# Patient Record
Sex: Female | Born: 1970 | Race: Black or African American | Hispanic: No | Marital: Single | Smoking: Never smoker
Health system: Southern US, Community
[De-identification: ages and names within clinical notes are randomized; demographics above are authoritative.]

## PROBLEM LIST (undated history)

## (undated) DIAGNOSIS — I10 Essential (primary) hypertension: Secondary | ICD-10-CM

## (undated) DIAGNOSIS — E119 Type 2 diabetes mellitus without complications: Secondary | ICD-10-CM

## (undated) DIAGNOSIS — F419 Anxiety disorder, unspecified: Secondary | ICD-10-CM

## (undated) DIAGNOSIS — J45909 Unspecified asthma, uncomplicated: Secondary | ICD-10-CM

## (undated) HISTORY — PX: BREAST SURGERY: SHX581

## (undated) HISTORY — PX: CHOLECYSTECTOMY: SHX55

## (undated) HISTORY — PX: HIGH RISK BREAST EXCISION: SHX6773

## (undated) HISTORY — PX: KNEE SURGERY: SHX244

---

## 2019-09-05 ENCOUNTER — Emergency Department (HOSPITAL_COMMUNITY)
Admission: EM | Admit: 2019-09-05 | Discharge: 2019-09-06 | Disposition: A | Discharge status: Home / Self Care | Payer: Medicare (Managed Care) | Attending: Emergency Medicine | Admitting: Emergency Medicine

## 2019-09-05 ENCOUNTER — Other Ambulatory Visit: Payer: Self-pay

## 2019-09-05 ENCOUNTER — Encounter (HOSPITAL_COMMUNITY): Payer: Self-pay | Admitting: Pediatrics

## 2019-09-05 DIAGNOSIS — Z5321 Procedure and treatment not carried out due to patient leaving prior to being seen by health care provider: Secondary | ICD-10-CM | POA: Diagnosis not present

## 2019-09-05 DIAGNOSIS — R109 Unspecified abdominal pain: Secondary | ICD-10-CM | POA: Diagnosis present

## 2019-09-05 LAB — CBC WITH DIFFERENTIAL/PLATELET
Abs Immature Granulocytes: 0.02 10*3/uL (ref 0.00–0.07)
Basophils Absolute: 0 10*3/uL (ref 0.0–0.1)
Basophils Relative: 1 %
Eosinophils Absolute: 0.1 10*3/uL (ref 0.0–0.5)
Eosinophils Relative: 1 %
HCT: 42.7 % (ref 36.0–46.0)
Hemoglobin: 13.1 g/dL (ref 12.0–15.0)
Immature Granulocytes: 0 %
Lymphocytes Relative: 36 %
Lymphs Abs: 2.1 10*3/uL (ref 0.7–4.0)
MCH: 25.7 pg — ABNORMAL LOW (ref 26.0–34.0)
MCHC: 30.7 g/dL (ref 30.0–36.0)
MCV: 83.9 fL (ref 80.0–100.0)
Monocytes Absolute: 0.4 10*3/uL (ref 0.1–1.0)
Monocytes Relative: 7 %
Neutro Abs: 3.2 10*3/uL (ref 1.7–7.7)
Neutrophils Relative %: 55 %
Platelets: 348 10*3/uL (ref 150–400)
RBC: 5.09 MIL/uL (ref 3.87–5.11)
RDW: 12 % (ref 11.5–15.5)
WBC: 5.9 10*3/uL (ref 4.0–10.5)
nRBC: 0 % (ref 0.0–0.2)

## 2019-09-05 LAB — COMPREHENSIVE METABOLIC PANEL
ALT: 25 U/L (ref 0–44)
AST: 18 U/L (ref 15–41)
Albumin: 3.5 g/dL (ref 3.5–5.0)
Alkaline Phosphatase: 139 U/L — ABNORMAL HIGH (ref 38–126)
Anion gap: 9 (ref 5–15)
BUN: 10 mg/dL (ref 6–20)
CO2: 26 mmol/L (ref 22–32)
Calcium: 9.2 mg/dL (ref 8.9–10.3)
Chloride: 99 mmol/L (ref 98–111)
Creatinine, Ser: 0.72 mg/dL (ref 0.44–1.00)
GFR calc Af Amer: >60 mL/min (ref >60)
GFR calc non Af Amer: >60 mL/min (ref >60)
Glucose, Bld: 448 mg/dL — ABNORMAL HIGH (ref 70–99)
Potassium: 4.1 mmol/L (ref 3.5–5.1)
Sodium: 134 mmol/L — ABNORMAL LOW (ref 135–145)
Total Bilirubin: 0.8 mg/dL (ref 0.3–1.2)
Total Protein: 6.7 g/dL (ref 6.5–8.1)

## 2019-09-05 LAB — URINALYSIS, ROUTINE W REFLEX MICROSCOPIC
Bacteria, UA: NONE SEEN
Bilirubin Urine: NEGATIVE
Glucose, UA: >=500 mg/dL — AB
Hgb urine dipstick: NEGATIVE
Ketones, ur: NEGATIVE mg/dL
Leukocytes,Ua: NEGATIVE
Nitrite: NEGATIVE
Protein, ur: NEGATIVE mg/dL
Specific Gravity, Urine: 1.023 (ref 1.005–1.030)
pH: 6 (ref 5.0–8.0)

## 2019-09-05 LAB — I-STAT BETA HCG BLOOD, ED (MC, WL, AP ONLY): I-stat hCG, quantitative: <5 m[IU]/mL (ref <5)

## 2019-09-05 NOTE — ED Triage Notes (Signed)
C/O right flank pain that radiates down and worst w/ breathing. Stated going on for couple of days.

## 2019-09-06 NOTE — ED Notes (Signed)
Patient stickers along with armband found in chair. Called this patient name with no response.

## 2019-09-07 ENCOUNTER — Ambulatory Visit (INDEPENDENT_AMBULATORY_CARE_PROVIDER_SITE_OTHER): Payer: Medicare (Managed Care)

## 2019-09-07 ENCOUNTER — Ambulatory Visit (HOSPITAL_COMMUNITY)
Admission: EM | Admit: 2019-09-07 | Discharge: 2019-09-07 | Disposition: A | Discharge status: Home / Self Care | Payer: Medicare (Managed Care) | Attending: Physician Assistant | Admitting: Physician Assistant

## 2019-09-07 ENCOUNTER — Encounter (HOSPITAL_COMMUNITY): Payer: Self-pay

## 2019-09-07 ENCOUNTER — Other Ambulatory Visit: Payer: Self-pay

## 2019-09-07 DIAGNOSIS — R1013 Epigastric pain: Secondary | ICD-10-CM | POA: Insufficient documentation

## 2019-09-07 DIAGNOSIS — R05 Cough: Secondary | ICD-10-CM | POA: Diagnosis not present

## 2019-09-07 DIAGNOSIS — J45909 Unspecified asthma, uncomplicated: Secondary | ICD-10-CM | POA: Insufficient documentation

## 2019-09-07 DIAGNOSIS — E119 Type 2 diabetes mellitus without complications: Secondary | ICD-10-CM | POA: Diagnosis not present

## 2019-09-07 DIAGNOSIS — Z7901 Long term (current) use of anticoagulants: Secondary | ICD-10-CM | POA: Diagnosis not present

## 2019-09-07 DIAGNOSIS — Z20822 Contact with and (suspected) exposure to covid-19: Secondary | ICD-10-CM | POA: Diagnosis not present

## 2019-09-07 DIAGNOSIS — R0789 Other chest pain: Secondary | ICD-10-CM | POA: Diagnosis not present

## 2019-09-07 DIAGNOSIS — M549 Dorsalgia, unspecified: Secondary | ICD-10-CM | POA: Insufficient documentation

## 2019-09-07 DIAGNOSIS — R0781 Pleurodynia: Secondary | ICD-10-CM | POA: Insufficient documentation

## 2019-09-07 DIAGNOSIS — Z7984 Long term (current) use of oral hypoglycemic drugs: Secondary | ICD-10-CM | POA: Insufficient documentation

## 2019-09-07 DIAGNOSIS — I10 Essential (primary) hypertension: Secondary | ICD-10-CM | POA: Insufficient documentation

## 2019-09-07 DIAGNOSIS — R059 Cough, unspecified: Secondary | ICD-10-CM

## 2019-09-07 DIAGNOSIS — Z79899 Other long term (current) drug therapy: Secondary | ICD-10-CM | POA: Diagnosis not present

## 2019-09-07 DIAGNOSIS — R112 Nausea with vomiting, unspecified: Secondary | ICD-10-CM | POA: Diagnosis not present

## 2019-09-07 DIAGNOSIS — M25511 Pain in right shoulder: Secondary | ICD-10-CM | POA: Diagnosis present

## 2019-09-07 HISTORY — DX: Essential (primary) hypertension: I10

## 2019-09-07 HISTORY — DX: Anxiety disorder, unspecified: F41.9

## 2019-09-07 HISTORY — DX: Unspecified asthma, uncomplicated: J45.909

## 2019-09-07 LAB — COMPREHENSIVE METABOLIC PANEL
ALT: 26 U/L (ref 0–44)
AST: 19 U/L (ref 15–41)
Albumin: 3.9 g/dL (ref 3.5–5.0)
Alkaline Phosphatase: 142 U/L — ABNORMAL HIGH (ref 38–126)
Anion gap: 10 (ref 5–15)
BUN: 8 mg/dL (ref 6–20)
CO2: 26 mmol/L (ref 22–32)
Calcium: 9.4 mg/dL (ref 8.9–10.3)
Chloride: 98 mmol/L (ref 98–111)
Creatinine, Ser: 0.73 mg/dL (ref 0.44–1.00)
GFR calc Af Amer: >60 mL/min (ref >60)
GFR calc non Af Amer: >60 mL/min (ref >60)
Glucose, Bld: 359 mg/dL — ABNORMAL HIGH (ref 70–99)
Potassium: 4.2 mmol/L (ref 3.5–5.1)
Sodium: 134 mmol/L — ABNORMAL LOW (ref 135–145)
Total Bilirubin: 0.8 mg/dL (ref 0.3–1.2)
Total Protein: 7.3 g/dL (ref 6.5–8.1)

## 2019-09-07 LAB — CBC
HCT: 43.6 % (ref 36.0–46.0)
Hemoglobin: 13.4 g/dL (ref 12.0–15.0)
MCH: 25.4 pg — ABNORMAL LOW (ref 26.0–34.0)
MCHC: 30.7 g/dL (ref 30.0–36.0)
MCV: 82.7 fL (ref 80.0–100.0)
Platelets: 370 10*3/uL (ref 150–400)
RBC: 5.27 MIL/uL — ABNORMAL HIGH (ref 3.87–5.11)
RDW: 12 % (ref 11.5–15.5)
WBC: 5.2 10*3/uL (ref 4.0–10.5)
nRBC: 0 % (ref 0.0–0.2)

## 2019-09-07 LAB — SARS CORONAVIRUS 2 (TAT 6-24 HRS): SARS Coronavirus 2: NEGATIVE

## 2019-09-07 LAB — LIPASE, BLOOD: Lipase: 28 U/L (ref 11–51)

## 2019-09-07 MED ORDER — ONDANSETRON HCL 4 MG PO TABS
4.0000 mg | ORAL_TABLET | Freq: Three times a day (TID) | ORAL | 0 refills | Status: DC | PRN
Start: 2019-09-07 — End: 2020-04-09

## 2019-09-07 MED ORDER — TIZANIDINE HCL 4 MG PO TABS
4.0000 mg | ORAL_TABLET | Freq: Three times a day (TID) | ORAL | 0 refills | Status: AC | PRN
Start: 2019-09-07 — End: 2019-09-14

## 2019-09-07 MED ORDER — ALUM & MAG HYDROXIDE-SIMETH 200-200-20 MG/5ML PO SUSP
ORAL | Status: AC
  Filled 2019-09-07: qty 30

## 2019-09-07 MED ORDER — BLOOD GLUCOSE MONITOR KIT: PACK | 0 refills | Status: DC

## 2019-09-07 MED ORDER — SUCRALFATE 1 GM/10ML PO SUSP
1.0000 g | Freq: Three times a day (TID) | ORAL | 0 refills | Status: DC
Start: 2019-09-07 — End: 2020-04-09

## 2019-09-07 MED ORDER — ALUM & MAG HYDROXIDE-SIMETH 200-200-20 MG/5ML PO SUSP
30.0000 mL | Freq: Once | ORAL | Status: AC
  Administered 2019-09-07: 30 mL via ORAL

## 2019-09-07 MED ORDER — LIDOCAINE VISCOUS HCL 2 % MT SOLN
15.0000 mL | Freq: Once | OROMUCOSAL | Status: AC
  Administered 2019-09-07: 15 mL via ORAL

## 2019-09-07 MED ORDER — ONDANSETRON 4 MG PO TBDP
ORAL_TABLET | ORAL | Status: AC
  Filled 2019-09-07: qty 1

## 2019-09-07 MED ORDER — LIDOCAINE VISCOUS HCL 2 % MT SOLN
OROMUCOSAL | Status: AC
  Filled 2019-09-07: qty 15

## 2019-09-07 MED ORDER — LIDOCAINE 4 % EX PTCH: 1.0000 "application " | MEDICATED_PATCH | Freq: Two times a day (BID) | CUTANEOUS | 0 refills | Status: DC

## 2019-09-07 MED ORDER — ONDANSETRON 4 MG PO TBDP
4.0000 mg | ORAL_TABLET | Freq: Once | ORAL | Status: AC
  Administered 2019-09-07: 4 mg via ORAL

## 2019-09-07 NOTE — Discharge Instructions (Addendum)
We have sent you lab work - We will call to discuss the findings if repeats are needed  Continue your home medicaitons  Add - carafate as prescribed - zofran only as needed for nausea - muscle relaxer as needed, this will make you sleepy do not drive or drink alcohol while taking - continue tylenol every 8 hours - apply lidocaine patches and warm compresses to areas of pain  If symptoms are worsening, go to the Emergency Department  Establish with a primary care provider ASAP for management of your diabetes

## 2019-09-07 NOTE — ED Provider Notes (Signed)
[place]    CSN: [id number] Arrival date & time: 09/07/19  0915      History   Chief Complaint Chief Complaint  Patient presents with  . Shoulder Pain    Right    HPI Laurie Andrews is a 49 y.o. female.   Patient reports to UC for back pain, side pain and right sided chest pain.  Pain has been present for at least the last 4 days.  She reports it started on the right side of her back but is since moved around to her chest.  She also endorses some pain in her upper right side of her belly.  Pain is are worse with movements and touch.  She does endorse some nausea and occasional vomiting, but this has not been frequent.  She is otherwise been eating and drinking.  She does endorse some abdominal discomfort with food.  Denies painful urination, frequency or urgency.  She denies shortness of breath.  She has been moving her bowels as usual.  There is no blood in the vomit.  No dark stools or blood in stool.  She does endorse a recent cough.  Denies fever and chills.   Patient reports she went to the emergency department on 7/28 however did not stay to be seen due to wait time.  Pain is not improved since then.  She reports she is new to the area and has been getting prescriptions from her previous doctor in [place].  This is to include pain medicines as well as diabetes medications.  She reports she takes glipizide and another medication.  She reports she has these medications.  She reports she has been out of testing strips.  She reports she cannot tolerate Metformin.  She reports she had her gallbladder out.  She is previously followed by nephrologist.     Past Medical History:  Diagnosis Date  . Anxiety   . Asthma   . Hypertension     There are no problems to display for this patient.   Past Surgical History:  Procedure Laterality Date  . BREAST SURGERY    . CHOLECYSTECTOMY    . KNEE SURGERY      OB History   No obstetric history on file.       Home Medications    Prior to Admission medications   Medication Sig Start Date End Date Taking? Authorizing Provider  albuterol (VENTOLIN HFA) 108 (90 Base) MCG/ACT inhaler INHALE 2 PUFFS 4 TIMES A DAY AS NEEDED FOR WHEEZING/SHORTNESS OF BREATH 03/05/19  Yes [provider]  glipiZIDE (GLUCOTROL) 10 MG tablet Take by mouth. 03/20/19  Yes [provider]  topiramate (TOPAMAX) 25 MG tablet TAKE 2 TABLETS BY MOUTH TWICE A DAY .Marland KitchenFOLLOW TITRATION SCHEDULE FROM MD 03/20/19  Yes [provider]  blood glucose meter kit and supplies KIT Dispense based on patient and insurance preference. Use up to four times daily as directed. (FOR ICD-9 250.00, 250.01). 09/07/19   Pocahontas Cohenour, Marguerita Beards, PA-C  Lidocaine (HM LIDOCAINE PATCH) 4 % PTCH Apply 1 application topically 2 (two) times daily. 09/07/19   Takya Vandivier, Marguerita Beards, PA-C  losartan (COZAAR) 50 MG tablet Take 50 mg by mouth daily. 08/29/19   [provider]  montelukast (SINGULAIR) 10 MG tablet SMARTSIG:1 Tablet(s) By Mouth Every Evening 08/29/19   [provider]  ondansetron (ZOFRAN) 4 MG tablet Take 1 tablet (4 mg total) by mouth every 8 (eight) hours as needed for nausea or vomiting. 09/07/19   Zamaria Brazzle,  Marguerita Beards, PA-C  pantoprazole (PROTONIX) 40 MG tablet Take 40 mg by mouth 2 (two) times daily. 08/29/19   [provider]  sucralfate (CARAFATE) 1 GM/10ML suspension Take 10 mLs (1 g total) by mouth 4 (four) times daily -  with meals and at bedtime. 09/07/19   Nnaemeka Samson, Marguerita Beards, PA-C  tiZANidine (ZANAFLEX) 4 MG tablet Take 1 tablet (4 mg total) by mouth every 8 (eight) hours as needed for up to 7 days for muscle spasms. 09/07/19 09/14/19  Antonio Creswell, Marguerita Beards, PA-C    Family History No family history on file.  Social History Social History   Tobacco Use  . Smoking status: Never Smoker  . Smokeless tobacco: Never Used  Vaping Use  . Vaping Use: Never used  Substance Use Topics  . Alcohol use: Not Currently  . Drug use: Never      Allergies   Aspirin, Keflex [cephalexin], Morphine and related, and Penicillins   Review of Systems Review of Systems   Physical Exam Triage Vital Signs ED Triage Vitals  Enc Vitals Group     BP 09/07/19 0956 (!) 159/73     Pulse Rate 09/07/19 0956 69     Resp 09/07/19 0956 18     Temp 09/07/19 0956 98.4 F (36.9 C)     Temp Source 09/07/19 0956 Oral     SpO2 09/07/19 0956 100 %     Weight 09/07/19 0957 175 lb 6.4 oz (79.6 kg)     Height 09/07/19 0957 '5\' 1"'$  (1.549 m)     Head Circumference --      Peak Flow --      Pain Score 09/07/19 0956 10     Pain Loc --      Pain Edu? --      Excl. in [place]? --    No data found.  Updated Vital Signs BP (!) 159/73   Pulse 69   Temp 98.4 F (36.9 C) (Oral)   Resp 18   Ht '5\' 1"'$  (1.549 m)   Wt 175 lb 6.4 oz (79.6 kg)   SpO2 100%   BMI 33.14 kg/m   Visual Acuity Right Eye Distance:   Left Eye Distance:   Bilateral Distance:    Right Eye Near:   Left Eye Near:    Bilateral Near:     Physical Exam Vitals and nursing note reviewed.  Constitutional:      General: She is not in acute distress.    Appearance: She is well-developed. She is not ill-appearing.  HENT:     Head: Normocephalic and atraumatic.  Eyes:     Conjunctiva/sclera: Conjunctivae normal.  Cardiovascular:     Rate and Rhythm: Normal rate and regular rhythm.     Heart sounds: No murmur heard.   Pulmonary:     Effort: Pulmonary effort is normal. No respiratory distress.     Breath sounds: Normal breath sounds.  Abdominal:     General: Bowel sounds are normal.     Palpations: Abdomen is soft.     Tenderness: There is abdominal tenderness (Epigastric and right upper quadrant.  ). There is no rebound.  Musculoskeletal:     Cervical back: Neck supple.     Comments: Tenderness to palpation of the right-sided back musculature around the right side of the ribs and musculature of the upper abdomen on the right side.  No midline spinal tenderness.   Skin:    General: Skin is warm and dry.  Neurological:  Mental Status: She is alert.      UC Treatments / Results  Labs (all labs ordered are listed, but only abnormal results are displayed) Labs Reviewed  CBC - Abnormal; Notable for the following components:      Result Value   RBC 5.27 (*)    MCH 25.4 (*)    All other components within normal limits  COMPREHENSIVE METABOLIC PANEL - Abnormal; Notable for the following components:   Sodium 134 (*)    Glucose, Bld 359 (*)    Alkaline Phosphatase 142 (*)    All other components within normal limits  SARS CORONAVIRUS 2 (TAT 6-24 HRS)  LIPASE, BLOOD    EKG Normal sinus rhythm.  No comparison.  Normal EKG.  Radiology DG Chest 2 View  Result Date: 09/07/2019 CLINICAL DATA:  Right side rib pain EXAM: CHEST - 2 VIEW COMPARISON:  None. FINDINGS: Heart and mediastinal contours are within normal limits. No focal opacities or effusions. No acute bony abnormality. No visible rib fracture. Aortic atherosclerosis. IMPRESSION: No active cardiopulmonary disease. Electronically Signed   By: Rolm Baptise M.D.   On: 09/07/2019 11:27    Procedures Procedures (including critical care time)  Medications Ordered in UC Medications  ondansetron (ZOFRAN-ODT) disintegrating tablet 4 mg (4 mg Oral Given 09/07/19 1146)  alum & mag hydroxide-simeth (MAALOX/MYLANTA) 200-200-20 MG/5ML suspension 30 mL (30 mLs Oral Given 09/07/19 1159)    And  lidocaine (XYLOCAINE) 2 % viscous mouth solution 15 mL (15 mLs Oral Given 09/07/19 1200)    Initial Impression / Assessment and Plan / UC Course  I have reviewed the triage vital signs and the nursing notes.  Pertinent labs & imaging results that were available during my care of the patient were reviewed by me and considered in my medical decision making (see chart for details).  Labs from 09/05/2019 emergency department visit reviewed.    #Epigastric pain #Musculoskeletal back pain #Chest wall  pain #Cough Patient is a 49 year old presenting with numerous concerns and complaints today.  EKG with normal sinus rhythm.  Labs stable, glucose improved from 09/05/2019 emergency department visit.  UA in emergency department visit with only glucosuria, no ketones, no other findings, doubt urinary system.  Per chart review she has had a high alkaline phosphate previously February 2021, no significant changes, gallbladder removed, doubt biliary.  Lipase normal.  She did have improvement of abdominal symptoms with GI cocktail.  Zofran given in clinic as she had episode of vomiting.  Chest x-ray without pathology.  Given these findings today likely this is an extra musculoskeletal pain as well as severe reflux symptoms . patient already on Protonix at home, will add Carafate.  Zofran as needed.  We will treat back pain is musculoskeletal, likely secondary to recent cough.  We will test her for Covid as well.  Discussed strict emergency department precautions.  Instructed patient that she needs to follow-up and establish with primary care soon as possible.  Resource given.  Patient verbalized understanding plan of care. -Lab results called and relayed to patient. Final Clinical Impressions(s) / UC Diagnoses   Final diagnoses:  Epigastric pain  Musculoskeletal back pain  Pain, chest wall  Cough     Discharge Instructions     We have sent you lab work - We will call to discuss the findings if repeats are needed  Continue your home medicaitons  Add - carafate as prescribed - zofran only as needed for nausea - muscle relaxer as needed, this will make  you sleepy do not drive or drink alcohol while taking - continue tylenol every 8 hours - apply lidocaine patches and warm compresses to areas of pain  If symptoms are worsening, go to the Emergency Department  Establish with a primary care provider ASAP for management of your diabetes      ED Prescriptions    Medication Sig Dispense Auth.  Provider   sucralfate (CARAFATE) 1 GM/10ML suspension Take 10 mLs (1 g total) by mouth 4 (four) times daily -  with meals and at bedtime. 420 mL Abdo Denault, Marguerita Beards, PA-C   ondansetron (ZOFRAN) 4 MG tablet Take 1 tablet (4 mg total) by mouth every 8 (eight) hours as needed for nausea or vomiting. 4 tablet Tiffane Sheldon, Marguerita Beards, PA-C   blood glucose meter kit and supplies KIT Dispense based on patient and insurance preference. Use up to four times daily as directed. (FOR ICD-9 250.00, 250.01). 1 each Reilley Valentine, Marguerita Beards, PA-C   tiZANidine (ZANAFLEX) 4 MG tablet Take 1 tablet (4 mg total) by mouth every 8 (eight) hours as needed for up to 7 days for muscle spasms. 21 tablet Michaiah Holsopple, Marguerita Beards, PA-C   Lidocaine (HM LIDOCAINE PATCH) 4 % PTCH Apply 1 application topically 2 (two) times daily. 10 patch Embrie Mikkelsen, Marguerita Beards, PA-C     I have reviewed the PDMP during this encounter.   Purnell Shoemaker, PA-C 09/07/19 2211

## 2019-09-07 NOTE — ED Triage Notes (Signed)
Pt reports having R sided shoulder pain and back pain that began 2 days ago. No known injury to areas. No urinary symptoms.

## 2020-02-21 ENCOUNTER — Ambulatory Visit (HOSPITAL_COMMUNITY)
Admission: EM | Admit: 2020-02-21 | Discharge: 2020-02-21 | Disposition: A | Discharge status: Home / Self Care | Payer: Medicare (Managed Care) | Attending: Family Medicine | Admitting: Family Medicine

## 2020-02-21 ENCOUNTER — Other Ambulatory Visit: Payer: Self-pay

## 2020-02-21 ENCOUNTER — Encounter (HOSPITAL_COMMUNITY): Payer: Self-pay | Admitting: *Deleted

## 2020-02-21 DIAGNOSIS — J069 Acute upper respiratory infection, unspecified: Secondary | ICD-10-CM

## 2020-02-21 DIAGNOSIS — U071 COVID-19: Secondary | ICD-10-CM | POA: Diagnosis not present

## 2020-02-21 MED ORDER — PROMETHAZINE-DM 6.25-15 MG/5ML PO SYRP: 5.0000 mL | ORAL_SOLUTION | Freq: Four times a day (QID) | ORAL | 0 refills | Status: DC | PRN

## 2020-02-21 MED ORDER — ONDANSETRON 4 MG PO TBDP: 4.0000 mg | ORAL_TABLET | Freq: Three times a day (TID) | ORAL | 0 refills | Status: DC | PRN

## 2020-02-21 NOTE — ED Triage Notes (Signed)
Patient reports productive cough, body aches, HA, N/V, generalized weakness starting yesterday, reports fever last night.      Took tylenol last a couple of hours ago without relief.   Coughing up green/bloody sputum.

## 2020-02-21 NOTE — ED Provider Notes (Signed)
[place]    CSN: [id number] Arrival date & time: 02/21/20  1707      History   Chief Complaint Chief Complaint  Patient presents with  . Cough  . Generalized Body Aches  . Fever    HPI Laurie Andrews is a 50 y.o. female.   Here today with 1 day history of fever, body aches, fatigue, generalized weakness, HA, N/V, cough with bloody and green sputum. Denies CP, SOB, wheezing, syncope, diarrhea. So far taking diabetic cough syrup and mucinex without relief. Several sick contacts recently. No known hx of pulmonary dz.      Past Medical History:  Diagnosis Date  . Anxiety   . Asthma   . Hypertension     There are no problems to display for this patient.   Past Surgical History:  Procedure Laterality Date  . BREAST SURGERY    . CHOLECYSTECTOMY    . KNEE SURGERY      OB History   No obstetric history on file.      Home Medications    Prior to Admission medications   Medication Sig Start Date End Date Taking? Authorizing Provider  amitriptyline (ELAVIL) 100 MG tablet Take 100 mg by mouth daily. 12/29/18  Yes [provider]  cetirizine (ZYRTEC) 10 MG tablet Take by mouth. 06/16/11  Yes [provider]  Cholecalciferol 25 MCG (1000 UT) capsule Take by mouth. 04/16/13  Yes [provider]  clonazePAM (KLONOPIN) 0.5 MG tablet Take by mouth. 09/24/11  Yes [provider]  cloNIDine (CATAPRES) 0.1 MG tablet TAKE 1 TABLET BY MOUTH EVERY MORNING AND 2 TABLETS AT BEDTIME 01/26/19  Yes [provider]  escitalopram (LEXAPRO) 10 MG tablet Take by mouth. 02/27/19  Yes [provider]  naratriptan (AMERGE) 2.5 MG tablet TAKE 1 TABLET BY MOUTH ONCE AS NEEDED FOR MIGRAINE HEADACHE, NO MORE THAN 3 DOSES A WEEK. 03/05/19  Yes [provider]  ondansetron (ZOFRAN ODT) 4 MG disintegrating tablet Take 1 tablet (4 mg total) by mouth every 8 (eight) hours as needed for nausea or vomiting. 02/21/20  Yes Volney American, PA-C  promethazine-dextromethorphan (PROMETHAZINE-DM) 6.25-15 MG/5ML syrup Take 5 mLs by mouth 4 (four) times daily as needed for cough. 02/21/20  Yes Volney American, PA-C  albuterol (VENTOLIN HFA) 108 (90 Base) MCG/ACT inhaler INHALE 2 PUFFS 4 TIMES A DAY AS NEEDED FOR WHEEZING/SHORTNESS OF BREATH 03/05/19   [provider]  blood glucose meter kit and supplies KIT Dispense based on patient and insurance preference. Use up to four times daily as directed. (FOR ICD-9 250.00, 250.01). 09/07/19   Darr, Edison Nasuti, PA-C  doxycycline (VIBRA-TABS) 100 MG tablet     [provider]  Dulaglutide (TRULICITY) 1.74 BS/4.9QP SOPN 1 (one) time per week    [provider]  Sanjuan Dame 17 GM/SCOOP powder Take by mouth. 09/04/19   [provider]  glipiZIDE (GLUCOTROL) 10 MG tablet Take by mouth. 03/20/19   [provider]  Lidocaine (HM LIDOCAINE PATCH) 4 % PTCH Apply 1 application topically 2 (two) times daily. 09/07/19   Darr, Edison Nasuti, PA-C  losartan (COZAAR) 50 MG tablet Take 50 mg by mouth daily. 08/29/19   [provider]  montelukast (SINGULAIR) 10 MG tablet SMARTSIG:1 Tablet(s) By Mouth Every Evening 08/29/19   [provider]  ondansetron (ZOFRAN) 4 MG tablet Take 1 tablet (4 mg total) by mouth every 8 (eight) hours as needed for nausea or vomiting. 09/07/19   Darr, Edison Nasuti,  PA-C  oxyCODONE (OXY IR/ROXICODONE) 5 MG immediate release tablet Take by mouth.    [provider]  pantoprazole (PROTONIX) 40 MG tablet Take 40 mg by mouth 2 (two) times daily. 08/29/19   [provider]  sucralfate (CARAFATE) 1 GM/10ML suspension Take 10 mLs (1 g total) by mouth 4 (four) times daily -  with meals and at bedtime. 09/07/19   Darr, Edison Nasuti, PA-C  temazepam (RESTORIL) 7.5 MG capsule as needed.    [provider]  topiramate (TOPAMAX) 25 MG tablet TAKE 2 TABLETS BY MOUTH TWICE A DAY .Marland KitchenFOLLOW TITRATION SCHEDULE FROM MD 03/20/19   [provider]    Family History No family history on file.  Social History Social History   Tobacco Use  . Smoking status: Never Smoker  . Smokeless tobacco: Never Used  Vaping Use  . Vaping Use: Never used  Substance Use Topics  . Alcohol use: Not Currently  . Drug use: Never     Allergies   Aspirin, Keflex [cephalexin], Morphine and related, and Penicillins   Review of Systems Review of Systems PER HPI   Physical Exam Triage Vital Signs ED Triage Vitals  Enc Vitals Group     BP 02/21/20 1758 (!) 146/74     Pulse Rate 02/21/20 1758 (!) 112     Resp 02/21/20 1758 17     Temp 02/21/20 1758 98.1 F (36.7 C)     Temp Source 02/21/20 1758 Oral     SpO2 02/21/20 1758 97 %     Weight --      Height --      Head Circumference --      Peak Flow --      Pain Score 02/21/20 1800 9     Pain Loc --      Pain Edu? --      Excl. in [place]? --    No data found.  Updated Vital Signs BP (!) 146/74 (BP Location: Left Arm)   Pulse (!) 112   Temp 98.1 F (36.7 C) (Oral)   Resp 17   SpO2 97%   Visual Acuity Right Eye Distance:   Left Eye Distance:   Bilateral Distance:    Right Eye Near:   Left Eye Near:    Bilateral Near:     Physical Exam Vitals and nursing note reviewed.  Constitutional:      Appearance: Normal appearance. She is not ill-appearing.  HENT:     Head: Atraumatic.     Right Ear: Tympanic membrane normal.     Left Ear: Tympanic membrane normal.     Nose: Rhinorrhea present.     Mouth/Throat:     Mouth: Mucous membranes are moist.     Pharynx: Posterior oropharyngeal erythema present.  Eyes:     Extraocular Movements: Extraocular movements intact.     Conjunctiva/sclera: Conjunctivae normal.  Cardiovascular:     Rate and Rhythm: Regular rhythm. Tachycardia present.     Heart sounds: Normal heart sounds.  Pulmonary:     Effort: Pulmonary effort is normal. No respiratory distress.     Breath sounds: Normal breath sounds. No wheezing or  rales.  Abdominal:     General: Bowel sounds are normal. There is no distension.     Palpations: Abdomen is soft.     Tenderness: There is no abdominal tenderness. There is no guarding.  Musculoskeletal:        General: Normal range of motion.     Cervical back:  Normal range of motion and neck supple.  Skin:    General: Skin is warm and dry.  Neurological:     Mental Status: She is alert and oriented to person, place, and time.  Psychiatric:        Mood and Affect: Mood normal.        Thought Content: Thought content normal.        Judgment: Judgment normal.      UC Treatments / Results  Labs (all labs ordered are listed, but only abnormal results are displayed) Labs Reviewed  SARS CORONAVIRUS 2 (TAT 6-24 HRS)    EKG   Radiology No results found.  Procedures Procedures (including critical care time)  Medications Ordered in UC Medications - No data to display  Initial Impression / Assessment and Plan / UC Course  I have reviewed the triage vital signs and the nursing notes.  Pertinent labs & imaging results that were available during my care of the patient were reviewed by me and considered in my medical decision making (see chart for details).     Mildly tachycardic and hypertensive in triage but otherwise reassuring vital signs and exam. Strong suspicion for COVID given her sxs, pcr pending, discussed isolation, supportive care, OTC medications and will send phenergan DM and zofran for prn symptomatic relief. Work note given. Strict return precautions for acutely worsening sxs.   Final Clinical Impressions(s) / UC Diagnoses   Final diagnoses:  Viral URI with cough   Discharge Instructions   None    ED Prescriptions    Medication Sig Dispense Auth. Provider   promethazine-dextromethorphan (PROMETHAZINE-DM) 6.25-15 MG/5ML syrup Take 5 mLs by mouth 4 (four) times daily as needed for cough. 100 mL Volney American, PA-C   ondansetron (ZOFRAN ODT) 4 MG  disintegrating tablet Take 1 tablet (4 mg total) by mouth every 8 (eight) hours as needed for nausea or vomiting. 20 tablet Volney American, [place]     PDMP not reviewed this encounter.   Volney American, [place] 02/21/20 1857

## 2020-02-22 LAB — SARS CORONAVIRUS 2 (TAT 6-24 HRS): SARS Coronavirus 2: POSITIVE — AB

## 2020-02-23 ENCOUNTER — Telehealth (HOSPITAL_COMMUNITY): Payer: Self-pay | Admitting: Family Medicine

## 2020-02-23 MED ORDER — PROMETHAZINE-DM 6.25-15 MG/5ML PO SYRP: 5.0000 mL | ORAL_SOLUTION | Freq: Four times a day (QID) | ORAL | 0 refills | Status: DC | PRN

## 2020-02-23 NOTE — Telephone Encounter (Signed)
Re-sent phenergan DM cough syrup

## 2020-04-05 ENCOUNTER — Encounter (HOSPITAL_COMMUNITY): Payer: Self-pay | Admitting: Emergency Medicine

## 2020-04-05 ENCOUNTER — Emergency Department (HOSPITAL_COMMUNITY)
Admission: EM | Admit: 2020-04-05 | Discharge: 2020-04-05 | Disposition: A | Discharge status: Home / Self Care | Payer: Medicare (Managed Care) | Attending: Emergency Medicine | Admitting: Emergency Medicine

## 2020-04-05 ENCOUNTER — Emergency Department (HOSPITAL_COMMUNITY): Payer: Medicare (Managed Care)

## 2020-04-05 ENCOUNTER — Other Ambulatory Visit: Payer: Self-pay

## 2020-04-05 DIAGNOSIS — Z79899 Other long term (current) drug therapy: Secondary | ICD-10-CM | POA: Insufficient documentation

## 2020-04-05 DIAGNOSIS — R109 Unspecified abdominal pain: Secondary | ICD-10-CM | POA: Diagnosis present

## 2020-04-05 DIAGNOSIS — R112 Nausea with vomiting, unspecified: Secondary | ICD-10-CM | POA: Diagnosis not present

## 2020-04-05 DIAGNOSIS — Z7984 Long term (current) use of oral hypoglycemic drugs: Secondary | ICD-10-CM | POA: Diagnosis not present

## 2020-04-05 DIAGNOSIS — J45909 Unspecified asthma, uncomplicated: Secondary | ICD-10-CM | POA: Diagnosis not present

## 2020-04-05 DIAGNOSIS — I10 Essential (primary) hypertension: Secondary | ICD-10-CM | POA: Insufficient documentation

## 2020-04-05 LAB — COMPREHENSIVE METABOLIC PANEL
ALT: 24 U/L (ref 0–44)
AST: 17 U/L (ref 15–41)
Albumin: 3.8 g/dL (ref 3.5–5.0)
Alkaline Phosphatase: 129 U/L — ABNORMAL HIGH (ref 38–126)
Anion gap: 11 (ref 5–15)
BUN: 11 mg/dL (ref 6–20)
CO2: 24 mmol/L (ref 22–32)
Calcium: 9.5 mg/dL (ref 8.9–10.3)
Chloride: 97 mmol/L — ABNORMAL LOW (ref 98–111)
Creatinine, Ser: 0.69 mg/dL (ref 0.44–1.00)
GFR, Estimated: >60 mL/min (ref >60)
Glucose, Bld: 403 mg/dL — ABNORMAL HIGH (ref 70–99)
Potassium: 4.7 mmol/L (ref 3.5–5.1)
Sodium: 132 mmol/L — ABNORMAL LOW (ref 135–145)
Total Bilirubin: 0.9 mg/dL (ref 0.3–1.2)
Total Protein: 7.3 g/dL (ref 6.5–8.1)

## 2020-04-05 LAB — I-STAT BETA HCG BLOOD, ED (MC, WL, AP ONLY): I-stat hCG, quantitative: <5 m[IU]/mL (ref <5)

## 2020-04-05 LAB — URINALYSIS, ROUTINE W REFLEX MICROSCOPIC
Bacteria, UA: NONE SEEN
Bilirubin Urine: NEGATIVE
Glucose, UA: >=500 mg/dL — AB
Hgb urine dipstick: NEGATIVE
Ketones, ur: NEGATIVE mg/dL
Leukocytes,Ua: NEGATIVE
Nitrite: NEGATIVE
Protein, ur: NEGATIVE mg/dL
Specific Gravity, Urine: >1.046 — ABNORMAL HIGH (ref 1.005–1.030)
pH: 6 (ref 5.0–8.0)

## 2020-04-05 LAB — CBC
HCT: 45.2 % (ref 36.0–46.0)
Hemoglobin: 13.9 g/dL (ref 12.0–15.0)
MCH: 25.3 pg — ABNORMAL LOW (ref 26.0–34.0)
MCHC: 30.8 g/dL (ref 30.0–36.0)
MCV: 82.2 fL (ref 80.0–100.0)
Platelets: 369 10*3/uL (ref 150–400)
RBC: 5.5 MIL/uL — ABNORMAL HIGH (ref 3.87–5.11)
RDW: 12.1 % (ref 11.5–15.5)
WBC: 5.3 10*3/uL (ref 4.0–10.5)
nRBC: 0 % (ref 0.0–0.2)

## 2020-04-05 LAB — LIPASE, BLOOD: Lipase: 34 U/L (ref 11–51)

## 2020-04-05 LAB — CBG MONITORING, ED: Glucose-Capillary: 329 mg/dL — ABNORMAL HIGH (ref 70–99)

## 2020-04-05 MED ORDER — ONDANSETRON 4 MG PO TBDP: 4.0000 mg | ORAL_TABLET | Freq: Three times a day (TID) | ORAL | 0 refills | Status: DC | PRN

## 2020-04-05 MED ORDER — ONDANSETRON 4 MG PO TBDP
4.0000 mg | ORAL_TABLET | Freq: Once | ORAL | Status: AC
  Administered 2020-04-05: 4 mg via ORAL
  Filled 2020-04-05: qty 1

## 2020-04-05 MED ORDER — SODIUM CHLORIDE 0.9 % IV BOLUS
1000.0000 mL | Freq: Once | INTRAVENOUS | Status: AC
  Administered 2020-04-05: 1000 mL via INTRAVENOUS

## 2020-04-05 MED ORDER — HYDROMORPHONE HCL 1 MG/ML IJ SOLN
0.5000 mg | Freq: Once | INTRAMUSCULAR | Status: AC
  Administered 2020-04-05: 0.5 mg via INTRAVENOUS
  Filled 2020-04-05: qty 1

## 2020-04-05 MED ORDER — IOHEXOL 300 MG/ML  SOLN
100.0000 mL | Freq: Once | INTRAMUSCULAR | Status: AC | PRN
  Administered 2020-04-05: 100 mL via INTRAVENOUS

## 2020-04-05 MED ORDER — DICLOFENAC SODIUM 1 % EX GEL: 2.0000 g | Freq: Four times a day (QID) | CUTANEOUS | 0 refills | Status: DC

## 2020-04-05 NOTE — ED Provider Notes (Signed)
Received patient as a handoff at shift change from [facility], [place].  In short, patient is a 50 year old female with PMH of HTN, asthma, anxiety, and abdominal surgery notable for prior cholecystectomy who presented to the ED for right side abdominal pain.  It was described as right-sided flank discomfort that radiates anteriorly.  Her discomfort is worse with palpation and certain movement.  No reported history of renal stones.  No urinary symptoms or vaginal discharge/bleeding/pelvic pain.  Patient was also noted to have elevated CBG to 403, but appears to be similar to priors.  She was treated with IV fluids, pain medications, and antiemetics.  Lower suspicion for pelvic pathology and CT abdomen and pelvis was ordered with contrast with primary concern for appendicitis versus diverticulitis versus obstructing kidney stone.  Laboratory work-up is without evidence of DKA or leukocytosis concerning for infection.  Lipase was within normal limits.  At time of handoff, UA and CT abdomen pelvis are pending.  Patient will need to be p.o. challenged and have adequate pain control.  She has prescribed oxycodone at home.  She had been given Dilaudid given reported allergy to morphine.  Physical Exam  BP 125/71 (BP Location: Right Arm)   Pulse 78   Temp 98 F (36.7 C) (Oral)   Resp 14   SpO2 99%   Physical Exam  ED Course/Procedures     Procedures Results for orders placed or performed during the hospital encounter of 04/05/20  Lipase, blood  Result Value Ref Range   Lipase 34 11 - 51 U/L  Comprehensive metabolic panel  Result Value Ref Range   Sodium 132 (L) 135 - 145 mmol/L   Potassium 4.7 3.5 - 5.1 mmol/L   Chloride 97 (L) 98 - 111 mmol/L   CO2 24 22 - 32 mmol/L   Glucose, Bld 403 (H) 70 - 99 mg/dL   BUN 11 6 - 20 mg/dL   Creatinine, Ser 8.33 0.44 - 1.00 mg/dL   Calcium 9.5 8.9 - 82.5 mg/dL   Total Protein 7.3 6.5 - 8.1 g/dL   Albumin 3.8 3.5 - 5.0 g/dL   AST 17 15 - 41 U/L   ALT  24 0 - 44 U/L   Alkaline Phosphatase 129 (H) 38 - 126 U/L   Total Bilirubin 0.9 0.3 - 1.2 mg/dL   GFR, Estimated >05 >39 mL/min   Anion gap 11 5 - 15  CBC  Result Value Ref Range   WBC 5.3 4.0 - 10.5 K/uL   RBC 5.50 (H) 3.87 - 5.11 MIL/uL   Hemoglobin 13.9 12.0 - 15.0 g/dL   HCT 76.7 34.1 - 93.7 %   MCV 82.2 80.0 - 100.0 fL   MCH 25.3 (L) 26.0 - 34.0 pg   MCHC 30.8 30.0 - 36.0 g/dL   RDW 90.2 40.9 - 73.5 %   Platelets 369 150 - 400 K/uL   nRBC 0.0 0.0 - 0.2 %  Urinalysis, Routine w reflex microscopic Urine, Clean Catch  Result Value Ref Range   Color, Urine YELLOW YELLOW   APPearance CLEAR CLEAR   Specific Gravity, Urine >1.046 (H) 1.005 - 1.030   pH 6.0 5.0 - 8.0   Glucose, UA >=500 (A) NEGATIVE mg/dL   Hgb urine dipstick NEGATIVE NEGATIVE   Bilirubin Urine NEGATIVE NEGATIVE   Ketones, ur NEGATIVE NEGATIVE mg/dL   Protein, ur NEGATIVE NEGATIVE mg/dL   Nitrite NEGATIVE NEGATIVE   Leukocytes,Ua NEGATIVE NEGATIVE   RBC / HPF 0-5 0 - 5 RBC/hpf  WBC, UA 0-5 0 - 5 WBC/hpf   Bacteria, UA NONE SEEN NONE SEEN   Squamous Epithelial / LPF 0-5 0 - 5  I-Stat beta hCG blood, ED  Result Value Ref Range   I-stat hCG, quantitative <5.0 <5 mIU/mL   Comment 3          POC CBG, ED  Result Value Ref Range   Glucose-Capillary 329 (H) 70 - 99 mg/dL   CT ABDOMEN PELVIS W CONTRAST  Result Date: 04/05/2020 CLINICAL DATA:  Right lower quadrant pain, possible appendicitis or kidney stone. EXAM: CT ABDOMEN AND PELVIS WITH CONTRAST TECHNIQUE: Multidetector CT imaging of the abdomen and pelvis was performed using the standard protocol following bolus administration of intravenous contrast. CONTRAST:  OMNIPAQUE IOHEXOL 300 MG/ML  SOLN COMPARISON:  None. FINDINGS: Lower chest: Lung bases are clear. Hepatobiliary: Mild hepatic steatosis without focal mass. Previous cholecystectomy. Biliary tree is normal. Pancreas: Normal. Spleen: Normal. Adrenals/Urinary Tract: Adrenal glands are normal. Kidneys  are normal size without hydronephrosis or nephrolithiasis. 1 cm cyst over the upper pole left kidney. Ureters are normal. Bladder is normal size. There is minimal patchy increased density along the posterior dependent bladder likely early contrast filling. Stomach/Bowel: Stomach and small bowel are normal. Appendix is normal. Colon is normal. Vascular/Lymphatic: Abdominal aorta is normal in caliber. No adenopathy. Reproductive: Normal. Other: No free fluid or focal inflammatory change. Musculoskeletal: No focal abnormality. IMPRESSION: 1. No acute findings in the abdomen/pelvis. Normal appendix. 2. 1 cm left renal cyst. 3. Mild hepatic steatosis without focal mass. Electronically Signed   By: Elberta Fortis M.D.   On: 04/05/2020 15:33    MDM   On my examination, patient is resting comfortably talking to her niece on the phone.  She states that she has had some improvement, but still endorses mild right-sided abdominal pain.  She states that she thought this was food poisoning because she ate [place] roadhouse last evening and did not quite sit well with her.  She did have some retching and emesis this morning which may be contributing to her right-sided abdominal discomfort, likely musculoskeletal in nature.  We went through the CT abdomen pelvis she was reassured by the negative findings.  She understands that she has a pain contract and that there will be no additional narcotics medications prescribed for her.  She is no longer endorsing nausea and was successfully p.o. challenge here in the ED.  Repeat CBG shows improvement and UA with glucose urea, but no infection.  This patient presents with abdominal pain of unclear etiology. A CT scan was performed to evaluate for potential causes of the abdominal pain, however, neither the clinical exam nor the CT has identified an emergent etiology for the abdominal pain. Specifically, given the benign exam, the laboratory studies, and unremarkable CT, I have a very  low suspicion for appendicitis, ischemic bowel, bowel perforation, or any other life threatening disease.  I discussed this with patient's son, Guy Sandifer, as well.  Encouraged outpatient follow-up with her primary care provider.  Patient voices understanding is agreeable to the plan.       Lorelee New, PA-C 04/05/20 1901    Terrilee Files, MD 04/06/20 [phone]

## 2020-04-05 NOTE — Discharge Instructions (Addendum)
Your work-up today was entirely reassuring.  Please follow-up with your primary care provider regarding today's encounter for ongoing evaluation and management.  I suspect that your discomfort is musculoskeletal.  Please take Zofran ODT as needed for nausea symptoms.  I have also prescribed you Voltaren which is an NSAID-based gel.  This is meant to be applied topically to help provide additional pain relief.  While you have a listed allergy to aspirin, this should be well tolerated.  Please discontinue should you develop a rash or other reaction..  Many cases can be observed and treated at home after initial evaluation in the emergency department. Even though you are being discharged home, abdominal pain can be unpredictable. Therefore, you need a repeat exam if your pain does not resolve, if it returns, or worsens. Most patient's with abdominal pain do not need to be admitted to the hospital or have surgery, but serious problems like appendicitis and gallbladder attacks can start out as nonspecific pain. Many abdominal conditions cannot be diagnosed in one visit, so follow-up evaluations are very important.  Please seek immediate care if you have any develop any of the following symptoms: The pain does not improve or gets worse.  You develop a fever or chills.  You keep throwing up (vomiting).  The pain is felt only in portions of the abdomen.  You pass bloody or black tarry stools.   You do not have a bowel movement for more than 4 days.  You have profuse (greater than 4/day), loose stools for more than 4 days.

## 2020-04-05 NOTE — ED Notes (Signed)
Patient discharge instructions reviewed with the patient. The patient verbalized understanding. Patient discharged. 

## 2020-04-05 NOTE — ED Triage Notes (Signed)
Pt. Stated, Im having rt, side pain, starts in my back and goes around to my stomach. This started this morning.

## 2020-04-05 NOTE — ED Provider Notes (Signed)
[facility] EMERGENCY DEPARTMENT Provider Note   CSN: [id number] Arrival date & time: 04/05/20  1225     History Chief Complaint  Patient presents with  . Abdominal Pain  . Nausea  . Emesis    Laurie Andrews is a 50 y.o. female.  Laurie Andrews is a 50 y.o. female with a history of hypertension, asthma and anxiety, previous cholecystectomy, who presents to the emergency department for evaluation of right-sided abdominal pain.  She reports that last night she went out to dinner with her family and states that her abdomen felt a bit off but she was able to sleep, woke up early this morning with severe right-sided abdominal pain that seems to start in her right side and flank and wraparound throughout the right side of her abdomen.  It is worse with movement or palpation, seems to improve if she stays very still.  She has had some chills but no fevers has had nausea and vomiting.  Has had multiple bowel movements today but reports they have been normal without diarrhea, no melena or hematochezia.  No associated chest pain or shortness of breath.  She denies any dysuria, urinary frequency or hematuria.  No vaginal bleeding or discharge, is on Depo-Provera.  Aside from cholecystectomy no other prior abdominal surgeries.  No medications prior to arrival.  No other aggravating or alleviating factors.        Past Medical History:  Diagnosis Date  . Anxiety   . Asthma   . Hypertension     There are no problems to display for this patient.   Past Surgical History:  Procedure Laterality Date  . BREAST SURGERY    . CHOLECYSTECTOMY    . KNEE SURGERY       OB History   No obstetric history on file.     No family history on file.  Social History   Tobacco Use  . Smoking status: Never Smoker  . Smokeless tobacco: Never Used  Vaping Use  . Vaping Use: Never used  Substance Use Topics  . Alcohol use: Not Currently  . Drug use: Never    Home  Medications Prior to Admission medications   Medication Sig Start Date End Date Taking? Authorizing Provider  albuterol (VENTOLIN HFA) 108 (90 Base) MCG/ACT inhaler Inhale 1-2 puffs into the lungs daily as needed for wheezing or shortness of breath. 03/05/19  Yes [provider]  amitriptyline (ELAVIL) 100 MG tablet Take 100 mg by mouth as needed for sleep. 12/29/18  Yes [provider]  cetirizine (ZYRTEC) 10 MG tablet Take 10 mg by mouth daily. 06/16/11  Yes [provider]  Cholecalciferol 25 MCG (1000 UT) capsule Take 1,000 Units by mouth daily. 04/16/13  Yes [provider]  clonazePAM (KLONOPIN) 0.5 MG tablet Take 0.5 mg by mouth daily. 09/24/11  Yes [provider]  Dulaglutide (TRULICITY) 1.02 HE/5.2DP SOPN Inject 0.75 mg as directed once a week.   Yes [provider]  glipiZIDE (GLUCOTROL) 10 MG tablet Take 20 mg by mouth daily before breakfast. 03/20/19  Yes [provider]  losartan (COZAAR) 50 MG tablet Take 50 mg by mouth daily. 08/29/19  Yes [provider]  montelukast (SINGULAIR) 10 MG tablet Take 10 mg by mouth daily. 08/29/19  Yes [provider]  naratriptan (AMERGE) 2.5 MG tablet Take 2.5 mg by mouth as needed for migraine. 03/05/19  Yes [provider]  nystatin cream (MYCOSTATIN) Apply 1 application topically 3 (three) times daily  as needed for dry skin. 01/05/20  Yes [provider]  oxyCODONE (OXY IR/ROXICODONE) 5 MG immediate release tablet Take 5 mg by mouth in the morning, at noon, and at bedtime.   Yes [provider]  pantoprazole (PROTONIX) 40 MG tablet Take 40 mg by mouth 2 (two) times daily. 08/29/19  Yes [provider]  topiramate (TOPAMAX) 25 MG tablet Take 50 mg by mouth 2 (two) times daily. 03/20/19  Yes [provider]  blood glucose meter kit and supplies KIT Dispense based on patient and insurance preference. Use up to four times daily as directed.  (FOR ICD-9 250.00, 250.01). 09/07/19   Darr, Edison Nasuti, PA-C  GAVILAX 17 GM/SCOOP powder Take 17 g by mouth daily as needed for mild constipation. 09/04/19   [provider]  Lidocaine (HM LIDOCAINE PATCH) 4 % PTCH Apply 1 application topically 2 (two) times daily. Patient not taking: No sig reported 09/07/19   Darr, Edison Nasuti, PA-C  ondansetron (ZOFRAN ODT) 4 MG disintegrating tablet Take 1 tablet (4 mg total) by mouth every 8 (eight) hours as needed for nausea or vomiting. Patient not taking: No sig reported 02/21/20   Volney American, PA-C  ondansetron (ZOFRAN) 4 MG tablet Take 1 tablet (4 mg total) by mouth every 8 (eight) hours as needed for nausea or vomiting. Patient not taking: No sig reported 09/07/19   Darr, Edison Nasuti, PA-C  promethazine-dextromethorphan (PROMETHAZINE-DM) 6.25-15 MG/5ML syrup Take 5 mLs by mouth 4 (four) times daily as needed for cough. Patient not taking: No sig reported 02/23/20   Volney American, PA-C  sucralfate (CARAFATE) 1 GM/10ML suspension Take 10 mLs (1 g total) by mouth 4 (four) times daily -  with meals and at bedtime. Patient not taking: No sig reported 09/07/19   Darr, Edison Nasuti, PA-C    Allergies    Keflex [cephalexin], Aspirin, Morphine and related, and Penicillins  Review of Systems   Review of Systems  Constitutional: Negative for chills and fever.  HENT: Negative.   Respiratory: Negative for cough and shortness of breath.   Cardiovascular: Negative for chest pain.  Gastrointestinal: Positive for abdominal pain, nausea and vomiting. Negative for blood in stool, constipation and diarrhea.  Genitourinary: Positive for flank pain. Negative for dysuria, frequency, hematuria, vaginal bleeding and vaginal discharge.  Musculoskeletal: Negative for arthralgias and myalgias.  Skin: Negative for color change and rash.  Neurological: Negative for dizziness, syncope and light-headedness.  All other systems reviewed and are negative.   Physical  Exam Updated Vital Signs BP (!) 156/73 (BP Location: Right Arm)   Pulse 90   Temp 98 F (36.7 C) (Oral)   Resp 18   SpO2 99%   Physical Exam Vitals and nursing note reviewed.  Constitutional:      General: She is not in acute distress.    Appearance: She is well-developed and well-nourished. She is obese. She is not ill-appearing or diaphoretic.     Comments: Appears uncomfortable but is in no acute distress  HENT:     Head: Normocephalic and atraumatic.     Mouth/Throat:     Mouth: Oropharynx is clear and moist. Mucous membranes are moist.     Pharynx: Oropharynx is clear.  Eyes:     General:        Right eye: No discharge.        Left eye: No discharge.     Extraocular Movements: EOM normal.     Pupils: Pupils are equal, round, and reactive to light.  Cardiovascular:     Rate and Rhythm: Normal rate and regular rhythm.     Pulses: Intact distal pulses.     Heart sounds: Normal heart sounds. No murmur heard. No friction rub. No gallop.   Pulmonary:     Effort: Pulmonary effort is normal. No respiratory distress.     Breath sounds: Normal breath sounds. No wheezing or rales.     Comments: Respirations equal and unlabored, patient able to speak in full sentences, lungs clear to auscultation bilaterally  Abdominal:     General: Bowel sounds are normal. There is no distension.     Palpations: Abdomen is soft. There is no mass.     Tenderness: There is abdominal tenderness in the right upper quadrant and right lower quadrant. There is right CVA tenderness. There is no guarding.     Comments: Abdomen is soft and nondistended, bowel sounds present throughout, there is focal tenderness noted throughout the right upper and lower quadrants as well as over the right flank, slight guarding present.  No left-sided abdominal tenderness  Musculoskeletal:        General: No deformity or edema.     Cervical back: Neck supple.  Skin:    General: Skin is warm and dry.     Capillary Refill:  Capillary refill takes less than 2 seconds.  Neurological:     Mental Status: She is alert.     Coordination: Coordination normal.     Comments: Speech is clear, able to follow commands Moves extremities without ataxia, coordination intact  Psychiatric:        Mood and Affect: Mood normal.        Behavior: Behavior normal.     ED Results / Procedures / Treatments   Labs (all labs ordered are listed, but only abnormal results are displayed) Labs Reviewed  COMPREHENSIVE METABOLIC PANEL - Abnormal; Notable for the following components:      Result Value   Sodium 132 (*)    Chloride 97 (*)    Glucose, Bld 403 (*)    Alkaline Phosphatase 129 (*)    All other components within normal limits  CBC - Abnormal; Notable for the following components:   RBC 5.50 (*)    MCH 25.3 (*)    All other components within normal limits  LIPASE, BLOOD  URINALYSIS, ROUTINE W REFLEX MICROSCOPIC  I-STAT BETA HCG BLOOD, ED ([facility], [facility], [facility] ONLY)    EKG None  Radiology No results found.  Procedures Procedures   Medications Ordered in ED Medications  HYDROmorphone (DILAUDID) injection 0.5 mg (has no administration in time range)  ondansetron (ZOFRAN-ODT) disintegrating tablet 4 mg (4 mg Oral Given 04/05/20 1244)  sodium chloride 0.9 % bolus 1,000 mL (1,000 mLs Intravenous New Bag/Given 04/05/20 1434)  HYDROmorphone (DILAUDID) injection 0.5 mg (0.5 mg Intravenous Given 04/05/20 1435)  iohexol (OMNIPAQUE) 300 MG/ML solution 100 mL (100 mLs Intravenous Contrast Given 04/05/20 1515)    ED Course  I have reviewed the triage vital signs and the nursing notes.  Pertinent labs & imaging results that were available during my care of the patient were reviewed by me and considered in my medical decision making (see chart for details).    MDM Rules/Calculators/A&P                         Patient presents to the ED with complaints of abdominal pain. Patient nontoxic appearing, in no apparent distress, vitals  WNL. On exam  patient tender to palpation throughout the right upper and lower quadrants with some tenderness over the right flank as well, no peritoneal signs. Will evaluate with labs and CT abdomen pelvis. Analgesics, anti-emetics, and fluids administered.   Differential diagnosis: Appendicitis, diverticulitis, kidney stone, pyelonephritis, obstruction, hepatitis, choledocholithiasis, pancreatitis, given more upper and flank pain associated with right-sided abdominal pain have lower suspicion for pelvic etiology such as ovarian cyst or mass, no discharge or bleeding.  I have independently ordered, reviewed and interpreted all labs and imaging:  CBC: No leukocytosis, normal hemoglobin CMP: Glucose of 403, patient is often hyperglycemic in the 3-4 100s when here in the ED, but no other significant electrolyte derangements, alk phos of 129 which is also at baseline for patient, will give IV fluids for hyperglycemia, normal renal and liver function Lipase: WNL UA: Pending Preg test: neg  At shift change CT abdomen pelvis is pending, care signed out to PA Krista Blue who will follow up on CT, if no acute changes in patient's pain is improved and tolerating p.o., can likely be discharged home.    Final Clinical Impression(s) / ED Diagnoses Final diagnoses:  Right sided abdominal pain  Non-intractable vomiting with nausea, unspecified vomiting type    Rx / DC Orders ED Discharge Orders    None       Janet Berlin 04/05/20 1534    Lajean Saver, MD 04/05/20 1606

## 2020-04-07 ENCOUNTER — Inpatient Hospital Stay (HOSPITAL_COMMUNITY)
Admission: EM | Admit: 2020-04-07 | Discharge: 2020-04-09 | DRG: 690 | Disposition: A | Discharge status: Home / Self Care | Payer: Medicare (Managed Care) | Attending: Internal Medicine | Admitting: Internal Medicine

## 2020-04-07 ENCOUNTER — Other Ambulatory Visit: Payer: Self-pay

## 2020-04-07 ENCOUNTER — Encounter (HOSPITAL_COMMUNITY): Payer: Self-pay | Admitting: Emergency Medicine

## 2020-04-07 DIAGNOSIS — F419 Anxiety disorder, unspecified: Secondary | ICD-10-CM | POA: Diagnosis present

## 2020-04-07 DIAGNOSIS — K5903 Drug induced constipation: Secondary | ICD-10-CM | POA: Diagnosis present

## 2020-04-07 DIAGNOSIS — G8929 Other chronic pain: Secondary | ICD-10-CM | POA: Diagnosis present

## 2020-04-07 DIAGNOSIS — M25562 Pain in left knee: Secondary | ICD-10-CM | POA: Diagnosis present

## 2020-04-07 DIAGNOSIS — Z7984 Long term (current) use of oral hypoglycemic drugs: Secondary | ICD-10-CM

## 2020-04-07 DIAGNOSIS — Z20822 Contact with and (suspected) exposure to covid-19: Secondary | ICD-10-CM | POA: Diagnosis present

## 2020-04-07 DIAGNOSIS — N12 Tubulo-interstitial nephritis, not specified as acute or chronic: Secondary | ICD-10-CM

## 2020-04-07 DIAGNOSIS — R112 Nausea with vomiting, unspecified: Secondary | ICD-10-CM

## 2020-04-07 DIAGNOSIS — E1165 Type 2 diabetes mellitus with hyperglycemia: Secondary | ICD-10-CM | POA: Diagnosis present

## 2020-04-07 DIAGNOSIS — N10 Acute tubulo-interstitial nephritis: Principal | ICD-10-CM | POA: Diagnosis present

## 2020-04-07 DIAGNOSIS — Z79899 Other long term (current) drug therapy: Secondary | ICD-10-CM

## 2020-04-07 DIAGNOSIS — I10 Essential (primary) hypertension: Secondary | ICD-10-CM | POA: Diagnosis present

## 2020-04-07 DIAGNOSIS — K5900 Constipation, unspecified: Secondary | ICD-10-CM

## 2020-04-07 DIAGNOSIS — Z880 Allergy status to penicillin: Secondary | ICD-10-CM

## 2020-04-07 DIAGNOSIS — J45909 Unspecified asthma, uncomplicated: Secondary | ICD-10-CM | POA: Diagnosis present

## 2020-04-07 DIAGNOSIS — F1120 Opioid dependence, uncomplicated: Secondary | ICD-10-CM | POA: Diagnosis present

## 2020-04-07 DIAGNOSIS — Z885 Allergy status to narcotic agent status: Secondary | ICD-10-CM

## 2020-04-07 DIAGNOSIS — Z886 Allergy status to analgesic agent status: Secondary | ICD-10-CM

## 2020-04-07 DIAGNOSIS — T402X5A Adverse effect of other opioids, initial encounter: Secondary | ICD-10-CM | POA: Diagnosis present

## 2020-04-07 HISTORY — DX: Type 2 diabetes mellitus without complications: E11.9

## 2020-04-07 LAB — COMPREHENSIVE METABOLIC PANEL
ALT: 22 U/L (ref 0–44)
AST: 24 U/L (ref 15–41)
Albumin: 3.8 g/dL (ref 3.5–5.0)
Alkaline Phosphatase: 135 U/L — ABNORMAL HIGH (ref 38–126)
Anion gap: 11 (ref 5–15)
BUN: 11 mg/dL (ref 6–20)
CO2: 24 mmol/L (ref 22–32)
Calcium: 9.3 mg/dL (ref 8.9–10.3)
Chloride: 99 mmol/L (ref 98–111)
Creatinine, Ser: 0.68 mg/dL (ref 0.44–1.00)
GFR, Estimated: >60 mL/min (ref >60)
Glucose, Bld: 250 mg/dL — ABNORMAL HIGH (ref 70–99)
Potassium: 4.2 mmol/L (ref 3.5–5.1)
Sodium: 134 mmol/L — ABNORMAL LOW (ref 135–145)
Total Bilirubin: 1 mg/dL (ref 0.3–1.2)
Total Protein: 7.5 g/dL (ref 6.5–8.1)

## 2020-04-07 LAB — CBC WITH DIFFERENTIAL/PLATELET
Abs Immature Granulocytes: 0.02 10*3/uL (ref 0.00–0.07)
Basophils Absolute: 0 10*3/uL (ref 0.0–0.1)
Basophils Relative: 0 %
Eosinophils Absolute: 0 10*3/uL (ref 0.0–0.5)
Eosinophils Relative: 0 %
HCT: 45.9 % (ref 36.0–46.0)
Hemoglobin: 14.4 g/dL (ref 12.0–15.0)
Immature Granulocytes: 0 %
Lymphocytes Relative: 30 %
Lymphs Abs: 2.2 10*3/uL (ref 0.7–4.0)
MCH: 25.4 pg — ABNORMAL LOW (ref 26.0–34.0)
MCHC: 31.4 g/dL (ref 30.0–36.0)
MCV: 80.8 fL (ref 80.0–100.0)
Monocytes Absolute: 0.5 10*3/uL (ref 0.1–1.0)
Monocytes Relative: 7 %
Neutro Abs: 4.6 10*3/uL (ref 1.7–7.7)
Neutrophils Relative %: 63 %
Platelets: 409 10*3/uL — ABNORMAL HIGH (ref 150–400)
RBC: 5.68 MIL/uL — ABNORMAL HIGH (ref 3.87–5.11)
RDW: 12.2 % (ref 11.5–15.5)
WBC: 7.4 10*3/uL (ref 4.0–10.5)
nRBC: 0 % (ref 0.0–0.2)

## 2020-04-07 LAB — URINALYSIS, ROUTINE W REFLEX MICROSCOPIC
Bilirubin Urine: NEGATIVE
Glucose, UA: >=500 mg/dL — AB
Hgb urine dipstick: NEGATIVE
Ketones, ur: 80 mg/dL — AB
Nitrite: NEGATIVE
Protein, ur: 30 mg/dL — AB
Specific Gravity, Urine: 1.028 (ref 1.005–1.030)
pH: 6 (ref 5.0–8.0)

## 2020-04-07 LAB — LIPASE, BLOOD: Lipase: 30 U/L (ref 11–51)

## 2020-04-07 MED ORDER — DIPHENHYDRAMINE HCL 50 MG/ML IJ SOLN
25.0000 mg | Freq: Once | INTRAMUSCULAR | Status: AC
  Administered 2020-04-07: 25 mg via INTRAVENOUS
  Filled 2020-04-07: qty 1

## 2020-04-07 MED ORDER — LACTATED RINGERS IV BOLUS
1000.0000 mL | Freq: Once | INTRAVENOUS | Status: AC
  Administered 2020-04-07: 1000 mL via INTRAVENOUS

## 2020-04-07 MED ORDER — PROCHLORPERAZINE EDISYLATE 10 MG/2ML IJ SOLN
10.0000 mg | Freq: Once | INTRAMUSCULAR | Status: AC
  Administered 2020-04-07: 10 mg via INTRAVENOUS
  Filled 2020-04-07: qty 2

## 2020-04-07 NOTE — ED Triage Notes (Signed)
Pt. Stated, I was given Zofran under tongue and did not work until they gave to me in IV.

## 2020-04-07 NOTE — ED Notes (Signed)
Zofran not given.

## 2020-04-07 NOTE — ED Triage Notes (Signed)
Pt. Stated, I was here on SAT.  for the same symptoms. Not any better. Throwing up, pain in my stomach and goes to the back.

## 2020-04-08 ENCOUNTER — Observation Stay (HOSPITAL_COMMUNITY): Payer: Medicare (Managed Care)

## 2020-04-08 ENCOUNTER — Encounter (HOSPITAL_COMMUNITY): Payer: Self-pay | Admitting: Internal Medicine

## 2020-04-08 DIAGNOSIS — I10 Essential (primary) hypertension: Secondary | ICD-10-CM | POA: Diagnosis not present

## 2020-04-08 DIAGNOSIS — R112 Nausea with vomiting, unspecified: Secondary | ICD-10-CM | POA: Diagnosis not present

## 2020-04-08 DIAGNOSIS — E1165 Type 2 diabetes mellitus with hyperglycemia: Secondary | ICD-10-CM | POA: Diagnosis present

## 2020-04-08 LAB — HEMOGLOBIN A1C
Hgb A1c MFr Bld: 13.7 % — ABNORMAL HIGH (ref 4.8–5.6)
Mean Plasma Glucose: 346.49 mg/dL

## 2020-04-08 LAB — HIV ANTIBODY (ROUTINE TESTING W REFLEX): HIV Screen 4th Generation wRfx: NONREACTIVE

## 2020-04-08 LAB — COMPREHENSIVE METABOLIC PANEL
ALT: 18 U/L (ref 0–44)
AST: 17 U/L (ref 15–41)
Albumin: 3 g/dL — ABNORMAL LOW (ref 3.5–5.0)
Alkaline Phosphatase: 108 U/L (ref 38–126)
Anion gap: 10 (ref 5–15)
BUN: 9 mg/dL (ref 6–20)
CO2: 22 mmol/L (ref 22–32)
Calcium: 8.5 mg/dL — ABNORMAL LOW (ref 8.9–10.3)
Chloride: 105 mmol/L (ref 98–111)
Creatinine, Ser: 0.55 mg/dL (ref 0.44–1.00)
GFR, Estimated: >60 mL/min (ref >60)
Glucose, Bld: 259 mg/dL — ABNORMAL HIGH (ref 70–99)
Potassium: 3.8 mmol/L (ref 3.5–5.1)
Sodium: 137 mmol/L (ref 135–145)
Total Bilirubin: 1.2 mg/dL (ref 0.3–1.2)
Total Protein: 5.9 g/dL — ABNORMAL LOW (ref 6.5–8.1)

## 2020-04-08 LAB — CBG MONITORING, ED
Glucose-Capillary: 285 mg/dL — ABNORMAL HIGH (ref 70–99)
Glucose-Capillary: 244 mg/dL — ABNORMAL HIGH (ref 70–99)
Glucose-Capillary: 140 mg/dL — ABNORMAL HIGH (ref 70–99)

## 2020-04-08 LAB — GLUCOSE, CAPILLARY: Glucose-Capillary: 189 mg/dL — ABNORMAL HIGH (ref 70–99)

## 2020-04-08 LAB — SARS CORONAVIRUS 2 (TAT 6-24 HRS): SARS Coronavirus 2: NEGATIVE

## 2020-04-08 MED ORDER — SODIUM CHLORIDE 0.9 % IV SOLN
1.0000 g | INTRAVENOUS | Status: DC
  Administered 2020-04-08: 1 g via INTRAVENOUS
  Filled 2020-04-08: qty 10

## 2020-04-08 MED ORDER — TOPIRAMATE 25 MG PO TABS
50.0000 mg | ORAL_TABLET | Freq: Two times a day (BID) | ORAL | Status: DC
  Administered 2020-04-08 – 2020-04-09 (×2): 50 mg via ORAL
  Filled 2020-04-08 (×3): qty 2

## 2020-04-08 MED ORDER — FENTANYL CITRATE (PF) 100 MCG/2ML IJ SOLN
12.5000 ug | INTRAMUSCULAR | Status: DC | PRN
Start: 2020-04-08 — End: 2020-04-09
  Administered 2020-04-08: 12.5 ug via INTRAVENOUS
  Filled 2020-04-08 (×2): qty 2

## 2020-04-08 MED ORDER — DICLOFENAC SODIUM 1 % EX GEL
2.0000 g | Freq: Three times a day (TID) | CUTANEOUS | Status: DC
  Administered 2020-04-08 – 2020-04-09 (×5): 2 g via TOPICAL
  Filled 2020-04-08 (×2): qty 100

## 2020-04-08 MED ORDER — LOSARTAN POTASSIUM 50 MG PO TABS
50.0000 mg | ORAL_TABLET | Freq: Every day | ORAL | Status: DC
  Administered 2020-04-08 – 2020-04-09 (×2): 50 mg via ORAL
  Filled 2020-04-08 (×2): qty 1

## 2020-04-08 MED ORDER — ACETAMINOPHEN 650 MG RE SUPP: 650.0000 mg | Freq: Four times a day (QID) | RECTAL | Status: DC | PRN

## 2020-04-08 MED ORDER — CLONAZEPAM 0.5 MG PO TABS
0.5000 mg | ORAL_TABLET | Freq: Every day | ORAL | Status: DC
  Administered 2020-04-09: 0.5 mg via ORAL
  Filled 2020-04-08 (×2): qty 1

## 2020-04-08 MED ORDER — SULFAMETHOXAZOLE-TRIMETHOPRIM 800-160 MG PO TABS: 1.0000 | ORAL_TABLET | Freq: Two times a day (BID) | ORAL | 0 refills | Status: DC

## 2020-04-08 MED ORDER — LACTATED RINGERS IV SOLN: INTRAVENOUS | Status: AC

## 2020-04-08 MED ORDER — SENNOSIDES-DOCUSATE SODIUM 8.6-50 MG PO TABS
1.0000 | ORAL_TABLET | Freq: Two times a day (BID) | ORAL | Status: DC
  Administered 2020-04-08 – 2020-04-09 (×2): 1 via ORAL
  Filled 2020-04-08 (×2): qty 1

## 2020-04-08 MED ORDER — ONDANSETRON HCL 4 MG/2ML IJ SOLN
4.0000 mg | Freq: Once | INTRAMUSCULAR | Status: AC
  Administered 2020-04-08: 4 mg via INTRAVENOUS
  Filled 2020-04-08: qty 2

## 2020-04-08 MED ORDER — AMITRIPTYLINE HCL 50 MG PO TABS: 100.0000 mg | ORAL_TABLET | Freq: Every evening | ORAL | Status: DC | PRN

## 2020-04-08 MED ORDER — MONTELUKAST SODIUM 10 MG PO TABS
10.0000 mg | ORAL_TABLET | Freq: Every day | ORAL | Status: DC
  Administered 2020-04-08 – 2020-04-09 (×2): 10 mg via ORAL
  Filled 2020-04-08 (×4): qty 1

## 2020-04-08 MED ORDER — ENOXAPARIN SODIUM 40 MG/0.4ML ~~LOC~~ SOLN
40.0000 mg | SUBCUTANEOUS | Status: DC
  Administered 2020-04-08 – 2020-04-09 (×2): 40 mg via SUBCUTANEOUS
  Filled 2020-04-08 (×2): qty 0.4

## 2020-04-08 MED ORDER — LACTATED RINGERS IV BOLUS
1000.0000 mL | Freq: Once | INTRAVENOUS | Status: AC
  Administered 2020-04-08: 1000 mL via INTRAVENOUS

## 2020-04-08 MED ORDER — INSULIN ASPART 100 UNIT/ML ~~LOC~~ SOLN
0.0000 [IU] | Freq: Three times a day (TID) | SUBCUTANEOUS | Status: DC
  Administered 2020-04-08: 5 [IU] via SUBCUTANEOUS
  Administered 2020-04-08: 1 [IU] via SUBCUTANEOUS
  Administered 2020-04-08 – 2020-04-09 (×2): 3 [IU] via SUBCUTANEOUS

## 2020-04-08 MED ORDER — PANTOPRAZOLE SODIUM 40 MG PO TBEC
40.0000 mg | DELAYED_RELEASE_TABLET | Freq: Two times a day (BID) | ORAL | Status: DC
  Administered 2020-04-08 – 2020-04-09 (×3): 40 mg via ORAL
  Filled 2020-04-08 (×3): qty 1

## 2020-04-08 MED ORDER — SODIUM CHLORIDE 0.9 % IV SOLN
1.0000 g | Freq: Once | INTRAVENOUS | Status: AC
  Administered 2020-04-08: 1 g via INTRAVENOUS
  Filled 2020-04-08: qty 10

## 2020-04-08 MED ORDER — POLYETHYLENE GLYCOL 3350 17 G PO PACK
17.0000 g | PACK | Freq: Every day | ORAL | Status: DC
  Administered 2020-04-08 – 2020-04-09 (×2): 17 g via ORAL
  Filled 2020-04-08 (×2): qty 1

## 2020-04-08 MED ORDER — ACETAMINOPHEN 325 MG PO TABS: 650.0000 mg | ORAL_TABLET | Freq: Four times a day (QID) | ORAL | Status: DC | PRN

## 2020-04-08 MED ORDER — ALBUTEROL SULFATE HFA 108 (90 BASE) MCG/ACT IN AERS
1.0000 | INHALATION_SPRAY | Freq: Four times a day (QID) | RESPIRATORY_TRACT | Status: DC | PRN
  Filled 2020-04-08: qty 6.7

## 2020-04-08 NOTE — ED Notes (Signed)
Lunch Tray Ordered @ 1004. 

## 2020-04-08 NOTE — ED Notes (Signed)
Teletracking (Transporter) ordered @ 1758-per Byrd Hesselbach, RN ordered by Marylene Land

## 2020-04-08 NOTE — Progress Notes (Signed)
Inpatient Diabetes Program Recommendations  AACE/ADA: New Consensus Statement on Inpatient Glycemic Control (2015)  Target Ranges:  Prepandial:   less than 140 mg/dL      Peak postprandial:   less than 180 mg/dL (1-2 hours)      Critically ill patients:  140 - 180 mg/dL   Lab Results  Component Value Date   GLUCAP 285 (H) 04/08/2020   HGBA1C 13.7 (H) 04/08/2020    Review of Glycemic Control  Diabetes history: DM 2 Outpatient Diabetes medications: trulicity 0.75 mg weekly, glipizide 20 mg Daily Current orders for Inpatient glycemic control:  Novolog 0-9 units tid  A1c 13.7% this admission  Inpatient Diabetes Program Recommendations:    Note 2 ED visits with abdominal pain and N/V. Glucose previously in the 400's at last ED visit. Pt is also on a Trulicity which may ave GI side effects as well. Unsure of when pt started medication. Attempted to call pt (this coordinator is working remote today) to discuss while in the ED. Pt did not pick up.  Pt would benefit from insulin at time of d/c. The operation of insulin pen is similar to her Trulicity medication. Do not see PCP listed. Will need close follow up. Will see pt this admission.  Will most likely need basal insulin... consider Lantus 10 units  Thanks,  Christena Deem RN, MSN, BC-ADM Inpatient Diabetes Coordinator Team Pager [phone] (8a-5p)

## 2020-04-08 NOTE — Plan of Care (Signed)
Patient seen and rounded on today.  Patient is a 50 year old female with PMH poorly controlled type 2 diabetes, hypertension, asthma who presented to the hospital with worsening nausea/vomiting and right-sided abdominal pain along with right lower back pain. She had been seen in the ER for similar on 04/05/2020 and was discharged home after unremarkable work-up. There was concern for possible right-sided pyelonephritis although CT abdomen/pelvis on 04/05/2020 was unremarkable. 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   Furthermore, she also is on chronic oxycodone (approx 5 mg TIDPRN), her A1c was checked today and is 13.7% (home regimen glipizide and Trulicity). Unsure how well she follows a carb consistent diet.   She was started on Rocephin and admitted for presumed pyelonephritis.  Personally reviewed her abdominal x-ray as well as CT abdomen/pelvis from 04/05/2020.  There is at least moderate stool burden consistent with constipation likely contributed to by chronic opioid use which may also be causing some of her nausea/vomiting.  Given her elevated A1c, there may also be a component of underlying gastroparesis.  Patient was not very forthcoming with providing details on her eating habits when questioned.  Plan - continue Rocephin for now; given UA appearance and R CVA tenderness on my exam too, reasonable to treat  - follow up cultures  - in regards to her N/V, abd pain, I do wonder about gastroparesis and constipation: continue bowel regimen; may need outpatient gastric emptying study, but certainly needs better glucose control - informed patient she will need insulin at discharge   Lewie Chamber, MD [facility] 04/08/2020, 4:46 PM

## 2020-04-08 NOTE — Progress Notes (Signed)
Pt arrived to unit via strecher from ED, accompanied by transported . Pt alert/oriented in no apparent distress.Pt situated/orientated to room/equipments. Welcome guide/menu provided to pt with instructions. Pt verbalizes understanding of instructions. Hospital valuables policy has been discussed with no complaints. Bed in lowest postion with 3 side rails up ,call bell/room phone within reach and all wheels locked.

## 2020-04-08 NOTE — ED Notes (Signed)
Pt given a cup of water 

## 2020-04-08 NOTE — ED Notes (Signed)
Pt ambulated to bathroom and back with minimal assistance 

## 2020-04-08 NOTE — ED Notes (Signed)
Dinner Tray Ordered @ 1721. 

## 2020-04-08 NOTE — ED Provider Notes (Signed)
[facility] EMERGENCY DEPARTMENT Provider Note   CSN: [id number] Arrival date & time: 04/07/20  1751     History Chief Complaint  Patient presents with  . Abdominal Pain  . Back Pain    Laurie Andrews is a 50 y.o. female.  The history is provided by the patient.  Abdominal Pain Pain location:  RLQ and R flank Pain quality: aching   Pain severity:  Moderate Onset quality:  Gradual Timing:  Constant Progression:  Worsening Chronicity:  Recurrent Relieved by:  Nothing Worsened by:  Movement and palpation Associated symptoms: constipation, fever, nausea and vomiting   Associated symptoms: no chest pain, no diarrhea, no dysuria and no shortness of breath   Back Pain Associated symptoms: abdominal pain and fever   Associated symptoms: no chest pain and no dysuria   Patient with history of hypertension, diabetes, chronic pain presents with continued abdominal pain and vomiting. Patient reports this started on February 26.  She came in to the ER at that time and had extensive work-up.  Since leaving the ER her pain is continued with associated vomiting.  It is nonbloody emesis.  She also reports constipation.  No chest pain or shortness of breath.  She has been feeling feverish. She reports taking daily chronic pain medications without any change    Past Medical History:  Diagnosis Date  . Anxiety   . Asthma   . Hypertension     There are no problems to display for this patient.   Past Surgical History:  Procedure Laterality Date  . BREAST SURGERY    . CHOLECYSTECTOMY    . KNEE SURGERY       OB History   No obstetric history on file.     No family history on file.  Social History   Tobacco Use  . Smoking status: Never Smoker  . Smokeless tobacco: Never Used  Vaping Use  . Vaping Use: Never used  Substance Use Topics  . Alcohol use: Not Currently  . Drug use: Never    Home Medications Prior to Admission medications   Medication  Sig Start Date End Date Taking? Authorizing Provider  albuterol (VENTOLIN HFA) 108 (90 Base) MCG/ACT inhaler Inhale 1-2 puffs into the lungs daily as needed for wheezing or shortness of breath. 03/05/19   [provider]  amitriptyline (ELAVIL) 100 MG tablet Take 100 mg by mouth as needed for sleep. 12/29/18   [provider]  blood glucose meter kit and supplies KIT Dispense based on patient and insurance preference. Use up to four times daily as directed. (FOR ICD-9 250.00, 250.01). 09/07/19   Darr, Jacob, PA-C  cetirizine (ZYRTEC) 10 MG tablet Take 10 mg by mouth daily. 06/16/11   [provider]  Cholecalciferol 25 MCG (1000 UT) capsule Take 1,000 Units by mouth daily. 04/16/13   [provider]  clonazePAM (KLONOPIN) 0.5 MG tablet Take 0.5 mg by mouth daily. 09/24/11   [provider]  diclofenac Sodium (VOLTAREN) 1 % GEL Apply 2 g topically 4 (four) times daily. 04/05/20   Green, Garrett L, PA-C  Dulaglutide (TRULICITY) 0.75 MG/0.5ML SOPN Inject 0.75 mg as directed once a week.    [provider]  GAVILAX 17 GM/SCOOP powder Take 17 g by mouth daily as needed for mild constipation. 09/04/19   [provider]  glipiZIDE (GLUCOTROL) 10 MG tablet Take 20 mg by mouth daily before breakfast. 03/20/19   [provider]  Lidocaine (HM LIDOCAINE PATCH) 4 %   PTCH Apply 1 application topically 2 (two) times daily. Patient not taking: No sig reported 09/07/19   Darr, Edison Nasuti, PA-C  losartan (COZAAR) 50 MG tablet Take 50 mg by mouth daily. 08/29/19   [provider]  montelukast (SINGULAIR) 10 MG tablet Take 10 mg by mouth daily. 08/29/19   [provider]  naratriptan (AMERGE) 2.5 MG tablet Take 2.5 mg by mouth as needed for migraine. 03/05/19   [provider]  nystatin cream (MYCOSTATIN) Apply 1 application topically 3 (three) times daily as needed for dry skin. 01/05/20   [provider]  ondansetron (ZOFRAN ODT)  4 MG disintegrating tablet Take 1 tablet (4 mg total) by mouth every 8 (eight) hours as needed for nausea or vomiting. 04/05/20   Corena Herter, PA-C  ondansetron (ZOFRAN) 4 MG tablet Take 1 tablet (4 mg total) by mouth every 8 (eight) hours as needed for nausea or vomiting. Patient not taking: No sig reported 09/07/19   Darr, Edison Nasuti, PA-C  oxyCODONE (OXY IR/ROXICODONE) 5 MG immediate release tablet Take 5 mg by mouth in the morning, at noon, and at bedtime.    [provider]  pantoprazole (PROTONIX) 40 MG tablet Take 40 mg by mouth 2 (two) times daily. 08/29/19   [provider]  promethazine-dextromethorphan (PROMETHAZINE-DM) 6.25-15 MG/5ML syrup Take 5 mLs by mouth 4 (four) times daily as needed for cough. Patient not taking: No sig reported 02/23/20   Volney American, PA-C  sucralfate (CARAFATE) 1 GM/10ML suspension Take 10 mLs (1 g total) by mouth 4 (four) times daily -  with meals and at bedtime. Patient not taking: No sig reported 09/07/19   Darr, Edison Nasuti, PA-C  topiramate (TOPAMAX) 25 MG tablet Take 50 mg by mouth 2 (two) times daily. 03/20/19   [provider]    Allergies    Keflex [cephalexin], Aspirin, Morphine and related, and Penicillins  Review of Systems   Review of Systems  Constitutional: Positive for fever.  Respiratory: Negative for shortness of breath.   Cardiovascular: Negative for chest pain.  Gastrointestinal: Positive for abdominal pain, constipation, nausea and vomiting. Negative for diarrhea.  Genitourinary: Negative for dysuria.  Musculoskeletal: Positive for back pain.  All other systems reviewed and are negative.   Physical Exam Updated Vital Signs BP 121/77 (BP Location: Left Arm)   Pulse 76   Temp 98.6 F (37 C) (Oral)   Resp 16   SpO2 98%   Physical Exam CONSTITUTIONAL: Well developed/well nourished, uncomfortable appearing HEAD: Normocephalic/atraumatic EYES: EOMI/PERRL ENMT: Mucous membranes moist NECK: supple no  meningeal signs SPINE/BACK:entire spine nontender CV: S1/S2 noted, no murmurs/rubs/gallops noted LUNGS: Lungs are clear to auscultation bilaterally, no apparent distress ABDOMEN: soft, moderate RLQ tenderness, no rebound or guarding, bowel sounds noted throughout abdomen GU: Right cva tenderness NEURO: Pt is awake/alert/appropriate, moves all extremitiesx4.  No facial droop.   EXTREMITIES: pulses normal/equal, full ROM SKIN: warm, color normal PSYCH: Flat affect  ED Results / Procedures / Treatments   Labs (all labs ordered are listed, but only abnormal results are displayed) Labs Reviewed  CBC WITH DIFFERENTIAL/PLATELET - Abnormal; Notable for the following components:      Result Value   RBC 5.68 (*)    MCH 25.4 (*)    Platelets 409 (*)    All other components within normal limits  COMPREHENSIVE METABOLIC PANEL - Abnormal; Notable for the following components:   Sodium 134 (*)    Glucose, Bld 250 (*)    Alkaline Phosphatase 135 (*)  All other components within normal limits  URINALYSIS, ROUTINE W REFLEX MICROSCOPIC - Abnormal; Notable for the following components:   Color, Urine AMBER (*)    APPearance CLOUDY (*)    Glucose, UA >=500 (*)    Ketones, ur 80 (*)    Protein, ur 30 (*)    Leukocytes,Ua LARGE (*)    Bacteria, UA MANY (*)    All other components within normal limits  URINE CULTURE  SARS CORONAVIRUS 2 (TAT 6-24 HRS)  LIPASE, BLOOD    EKG EKG Interpretation  Date/Time:  Monday April 07 2020 23:26:32 EST Ventricular Rate:  75 PR Interval:    QRS Duration: 101 QT Interval:  429 QTC Calculation: 480 R Axis:   6 Text Interpretation: Sinus rhythm Low voltage, precordial leads Interpretation limited secondary to artifact Confirmed by Wickline, Donald ([id number]) on 04/07/2020 11:40:28 PM   Radiology No results found.  Procedures Procedures   Medications Ordered in ED Medications  ondansetron (ZOFRAN) injection 4 mg (4 mg Intravenous Patient Refused/Not  Given 04/08/20 0158)  prochlorperazine (COMPAZINE) injection 10 mg (10 mg Intravenous Given 04/07/20 2242)  diphenhydrAMINE (BENADRYL) injection 25 mg (25 mg Intravenous Given 04/07/20 2242)  lactated ringers bolus 1,000 mL (0 mLs Intravenous Stopped 04/08/20 0110)  cefTRIAXone (ROCEPHIN) 1 g in sodium chloride 0.9 % 100 mL IVPB (0 g Intravenous Stopped 04/08/20 0158)  lactated ringers bolus 1,000 mL (1,000 mLs Intravenous New Bag/Given 04/08/20 0158)    ED Course  I have reviewed the triage vital signs and the nursing notes.  Pertinent labs & imaging results that were available during my care of the patient were reviewed by me and considered in my medical decision making (see chart for details).    MDM Rules/Calculators/A&P                          1:01 AM Patient with recent ER work-up that included a CT scan.  This was negative Before my evaluation patient was having significant vomiting and was given Compazine and Benadryl. IV fluids have been ordered.  She is now found to have a UTI, will add on Rocephin. 4:42 AM Patient with continued nausea and right flank pain.  She has been given multiple doses of anti-nausea medications. Since patient is a bounce back to the ER, with continued symptoms, I am concerned that she would fail outpatient management, will admit to the hospital for IV rehydration and IV antibiotics.  Will defer further imaging as she just had a CT scan within the past 3 days. Discussed with Dr. Kakrakandy for admission Final Clinical Impression(s) / ED Diagnoses Final diagnoses:  Pyelonephritis  Intractable nausea and vomiting    Rx / DC Orders ED Discharge Orders    None       Wickline, Donald, MD 04/08/20 0443  

## 2020-04-08 NOTE — H&P (Signed)
History and Physical    Laurie Andrews EXB:[id number] DOB: 1970-12-05 DOA: 04/07/2020  PCP: Pcp, No  Patient coming from: Home.  Chief Complaint: Nausea vomiting.  HPI: Laurie Andrews is a 50 y.o. female with history of diabetes mellitus type 2, hypertension, asthma presents to the ER for the second time in 3 days with intractable nausea vomiting and abdominal pain.  Abdominal pain is mostly in the right flank area.  Radiates from the mid back all the way to the right flank.  Denies any diarrhea.  Denies any blood in the vomitus.  Had some subjective feeling of fever chills denies any dysuria.  CT scan done on April 05, 2020 3 days ago was unremarkable.  Due to persistent vomiting patient presents again to the ER.  ED Course: On exam patient has right flank tenderness.  He UA shows features possibility of urine infection but also shows some squamous cells.  Labs are remarkable for sodium of 130 blood sugar of 250 anion gap of 11 urine shows a lot of ketones.  Patient started on fluids ceftriaxone admitted for intractable nausea vomiting with right flank pain concerning for possible pyelonephritis.  Note that patient has had previous cholecystectomy and LFTs and lipase are normal at this time.  Denies any chest pain or shortness of breath.  Covid testing negative.  KUB is pending.  Review of Systems: As per HPI, rest all negative.   Past Medical History:  Diagnosis Date  . Anxiety   . Asthma   . Diabetes mellitus without complication ([place])   . Hypertension     Past Surgical History:  Procedure Laterality Date  . BREAST SURGERY    . CHOLECYSTECTOMY    . KNEE SURGERY       reports that she has never smoked. She has never used smokeless tobacco. She reports previous alcohol use. She reports that she does not use drugs.  Allergies  Allergen Reactions  . Keflex [Cephalexin] Nausea And Vomiting  . Aspirin Nausea And Vomiting and Rash  . Morphine And Related Itching and Rash  .  Penicillins Nausea And Vomiting and Rash    Family History  Family history unknown: Yes    Prior to Admission medications   Medication Sig Start Date End Date Taking? Authorizing Provider  sulfamethoxazole-trimethoprim (BACTRIM DS) 800-160 MG tablet Take 1 tablet by mouth 2 (two) times daily for 7 days. 04/08/20 04/15/20 Yes Ripley Fraise, MD  albuterol (VENTOLIN HFA) 108 (90 Base) MCG/ACT inhaler Inhale 1-2 puffs into the lungs daily as needed for wheezing or shortness of breath. 03/05/19   [provider]  amitriptyline (ELAVIL) 100 MG tablet Take 100 mg by mouth as needed for sleep. 12/29/18   [provider]  blood glucose meter kit and supplies KIT Dispense based on patient and insurance preference. Use up to four times daily as directed. (FOR ICD-9 250.00, 250.01). 09/07/19   Darr, Edison Nasuti, PA-C  cetirizine (ZYRTEC) 10 MG tablet Take 10 mg by mouth daily. 06/16/11   [provider]  Cholecalciferol 25 MCG (1000 UT) capsule Take 1,000 Units by mouth daily. 04/16/13   [provider]  clonazePAM (KLONOPIN) 0.5 MG tablet Take 0.5 mg by mouth daily. 09/24/11   [provider]  diclofenac Sodium (VOLTAREN) 1 % GEL Apply 2 g topically 4 (four) times daily. 04/05/20   Corena Herter, PA-C  Dulaglutide (TRULICITY) 1.02 VO/5.3GU SOPN Inject 0.75 mg as directed once a week.    [provider]  Sanjuan Dame  17 GM/SCOOP powder Take 17 g by mouth daily as needed for mild constipation. 09/04/19   [provider]  glipiZIDE (GLUCOTROL) 10 MG tablet Take 20 mg by mouth daily before breakfast. 03/20/19   [provider]  Lidocaine (HM LIDOCAINE PATCH) 4 % PTCH Apply 1 application topically 2 (two) times daily. Patient not taking: No sig reported 09/07/19   Darr, Edison Nasuti, PA-C  losartan (COZAAR) 50 MG tablet Take 50 mg by mouth daily. 08/29/19   [provider]  montelukast (SINGULAIR) 10 MG tablet Take 10 mg by mouth daily. 08/29/19   [provider]  naratriptan (AMERGE) 2.5 MG tablet Take 2.5 mg by mouth as needed for migraine. 03/05/19   [provider]  nystatin cream (MYCOSTATIN) Apply 1 application topically 3 (three) times daily as needed for dry skin. 01/05/20   [provider]  ondansetron (ZOFRAN ODT) 4 MG disintegrating tablet Take 1 tablet (4 mg total) by mouth every 8 (eight) hours as needed for nausea or vomiting. 04/05/20   Corena Herter, PA-C  ondansetron (ZOFRAN) 4 MG tablet Take 1 tablet (4 mg total) by mouth every 8 (eight) hours as needed for nausea or vomiting. Patient not taking: No sig reported 09/07/19   Darr, Edison Nasuti, PA-C  oxyCODONE (OXY IR/ROXICODONE) 5 MG immediate release tablet Take 5 mg by mouth in the morning, at noon, and at bedtime.    [provider]  pantoprazole (PROTONIX) 40 MG tablet Take 40 mg by mouth 2 (two) times daily. 08/29/19   [provider]  promethazine-dextromethorphan (PROMETHAZINE-DM) 6.25-15 MG/5ML syrup Take 5 mLs by mouth 4 (four) times daily as needed for cough. Patient not taking: No sig reported 02/23/20   Volney American, PA-C  sucralfate (CARAFATE) 1 GM/10ML suspension Take 10 mLs (1 g total) by mouth 4 (four) times daily -  with meals and at bedtime. Patient not taking: No sig reported 09/07/19   Darr, Edison Nasuti, PA-C  topiramate (TOPAMAX) 25 MG tablet Take 50 mg by mouth 2 (two) times daily. 03/20/19   [provider]    Physical Exam: Constitutional: Moderately built and nourished. Vitals:   04/08/20 0053 04/08/20 0217 04/08/20 0315 04/08/20 0330  BP: 121/77 (!) 159/83 (!) 154/90 (!) 161/75  Pulse: 76 71 71 73  Resp: $Remo'16 16 17 15  'VPUZS$ Temp:      TempSrc:      SpO2: 98% 98% 98% 98%   Eyes: Anicteric no pallor. ENMT: No discharge from the ears eyes nose or mouth. Neck: No mass felt.  No neck rigidity. Respiratory: No rhonchi or crepitations. Cardiovascular: S1-S2 heard. Abdomen: Soft right flank tenderness no guarding or  rigidity. Musculoskeletal: No edema. Skin: No rash. Neurologic: Alert awake oriented to time place and person.  Moves all extremities. Psychiatric: Appears normal.  Normal affect.   Labs on Admission: I have personally reviewed following labs and imaging studies  CBC: Recent Labs  Lab 04/05/20 1259 04/07/20 2247  WBC 5.3 7.4  NEUTROABS  --  4.6  HGB 13.9 14.4  HCT 45.2 45.9  MCV 82.2 80.8  PLT 369 623*   Basic Metabolic Panel: Recent Labs  Lab 04/05/20 1259 04/07/20 2247  NA 132* 134*  K 4.7 4.2  CL 97* 99  CO2 24 24  GLUCOSE 403* 250*  BUN 11 11  CREATININE 0.69 0.68  CALCIUM 9.5 9.3   GFR: CrCl cannot be calculated (Unknown ideal weight.). Liver Function Tests: Recent Labs  Lab 04/05/20 1259 04/07/20 2247  AST 17 24  ALT 24 22  ALKPHOS 129* 135*  BILITOT 0.9 1.0  PROT 7.3 7.5  ALBUMIN 3.8 3.8   Recent Labs  Lab 04/05/20 1259 04/07/20 2247  LIPASE 34 30   No results for input(s): AMMONIA in the last 168 hours. Coagulation Profile: No results for input(s): INR, PROTIME in the last 168 hours. Cardiac Enzymes: No results for input(s): CKTOTAL, CKMB, CKMBINDEX, TROPONINI in the last 168 hours. BNP (last 3 results) No results for input(s): PROBNP in the last 8760 hours. HbA1C: No results for input(s): HGBA1C in the last 72 hours. CBG: Recent Labs  Lab 04/05/20 1730  GLUCAP 329*   Lipid Profile: No results for input(s): CHOL, HDL, LDLCALC, TRIG, CHOLHDL, LDLDIRECT in the last 72 hours. Thyroid Function Tests: No results for input(s): TSH, T4TOTAL, FREET4, T3FREE, THYROIDAB in the last 72 hours. Anemia Panel: No results for input(s): VITAMINB12, FOLATE, FERRITIN, TIBC, IRON, RETICCTPCT in the last 72 hours. Urine analysis:    Component Value Date/Time   COLORURINE AMBER (A) 04/07/2020 2322   APPEARANCEUR CLOUDY (A) 04/07/2020 2322   LABSPEC 1.028 04/07/2020 2322   PHURINE 6.0 04/07/2020 2322   GLUCOSEU >=500 (A) 04/07/2020 2322   HGBUR  NEGATIVE 04/07/2020 2322   BILIRUBINUR NEGATIVE 04/07/2020 2322   KETONESUR 80 (A) 04/07/2020 2322   PROTEINUR 30 (A) 04/07/2020 2322   NITRITE NEGATIVE 04/07/2020 2322   LEUKOCYTESUR LARGE (A) 04/07/2020 2322   Sepsis Labs: $RemoveBefo'@[email or web address]'CaCPhShmTuv$ (procalcitonin:4,lacticidven:4) )No results found for this or any previous visit (from the past 240 hour(s)).   Radiological Exams on Admission: No results found.  EKG: Independently reviewed.  Normal sinus rhythm.  Assessment/Plan Principal Problem:   Nausea & vomiting Active Problems:   Type 2 diabetes mellitus with hyperglycemia (HCC)   Essential hypertension   Intractable nausea and vomiting    1. Intractable nausea vomiting with right flank tenderness concerning for possible pyelonephritis as the cause.  KUB is pending.  Recent CAT scan done 3 days ago was unremarkable.  LFTs and lipase are normal.  UA shows features concerning for UTI however there is some squamous cells also.  Will continue with ceftriaxone fluids advance diet as tolerated follow urine cultures repeat labs.  Antiemetics and pain medication. 2. Diabetes mellitus type 2 with hyperglycemia we will keep patient on sliding scale coverage.  Nausea vomiting could also be from gastroparesis. 3. Hypertension on ARB. 4. History of asthma presently not wheezing. 5. History of chronic left knee pain on oxycodone takes at least 2 to 3 pills a week.   DVT prophylaxis: Lovenox. Code Status: Full code. Family Communication: Discussed with patient. Disposition Plan: Home. Consults called: None. Admission status: Observation.   Rise Patience MD [facility] Pager [phone].  If 7PM-7AM, please contact night-coverage [email or web address] Password [facility]  04/08/2020, 5:47 AM

## 2020-04-09 ENCOUNTER — Encounter: Payer: Self-pay | Admitting: Internal Medicine

## 2020-04-09 DIAGNOSIS — E1165 Type 2 diabetes mellitus with hyperglycemia: Secondary | ICD-10-CM | POA: Diagnosis not present

## 2020-04-09 DIAGNOSIS — R112 Nausea with vomiting, unspecified: Secondary | ICD-10-CM | POA: Diagnosis not present

## 2020-04-09 DIAGNOSIS — F1120 Opioid dependence, uncomplicated: Secondary | ICD-10-CM

## 2020-04-09 DIAGNOSIS — N10 Acute tubulo-interstitial nephritis: Secondary | ICD-10-CM

## 2020-04-09 DIAGNOSIS — I10 Essential (primary) hypertension: Secondary | ICD-10-CM | POA: Diagnosis not present

## 2020-04-09 DIAGNOSIS — K5900 Constipation, unspecified: Secondary | ICD-10-CM

## 2020-04-09 LAB — CBC WITH DIFFERENTIAL/PLATELET
Abs Immature Granulocytes: 0.01 10*3/uL (ref 0.00–0.07)
Basophils Absolute: 0 10*3/uL (ref 0.0–0.1)
Basophils Relative: 0 %
Eosinophils Absolute: 0.1 10*3/uL (ref 0.0–0.5)
Eosinophils Relative: 1 %
HCT: 42.8 % (ref 36.0–46.0)
Hemoglobin: 13.4 g/dL (ref 12.0–15.0)
Immature Granulocytes: 0 %
Lymphocytes Relative: 39 %
Lymphs Abs: 2.4 10*3/uL (ref 0.7–4.0)
MCH: 25.6 pg — ABNORMAL LOW (ref 26.0–34.0)
MCHC: 31.3 g/dL (ref 30.0–36.0)
MCV: 81.8 fL (ref 80.0–100.0)
Monocytes Absolute: 0.5 10*3/uL (ref 0.1–1.0)
Monocytes Relative: 8 %
Neutro Abs: 3.2 10*3/uL (ref 1.7–7.7)
Neutrophils Relative %: 52 %
Platelets: 366 10*3/uL (ref 150–400)
RBC: 5.23 MIL/uL — ABNORMAL HIGH (ref 3.87–5.11)
RDW: 12.1 % (ref 11.5–15.5)
WBC: 6.2 10*3/uL (ref 4.0–10.5)
nRBC: 0 % (ref 0.0–0.2)

## 2020-04-09 LAB — BASIC METABOLIC PANEL
Anion gap: 12 (ref 5–15)
BUN: 6 mg/dL (ref 6–20)
CO2: 20 mmol/L — ABNORMAL LOW (ref 22–32)
Calcium: 9.3 mg/dL (ref 8.9–10.3)
Chloride: 104 mmol/L (ref 98–111)
Creatinine, Ser: 0.65 mg/dL (ref 0.44–1.00)
GFR, Estimated: >60 mL/min (ref >60)
Glucose, Bld: 191 mg/dL — ABNORMAL HIGH (ref 70–99)
Potassium: 3.9 mmol/L (ref 3.5–5.1)
Sodium: 136 mmol/L (ref 135–145)

## 2020-04-09 LAB — MAGNESIUM: Magnesium: 1.9 mg/dL (ref 1.7–2.4)

## 2020-04-09 LAB — URINE CULTURE

## 2020-04-09 LAB — GLUCOSE, CAPILLARY
Glucose-Capillary: 201 mg/dL — ABNORMAL HIGH (ref 70–99)
Glucose-Capillary: 203 mg/dL — ABNORMAL HIGH (ref 70–99)

## 2020-04-09 MED ORDER — ONDANSETRON 4 MG PO TBDP: 4.0000 mg | ORAL_TABLET | Freq: Three times a day (TID) | ORAL | 1 refills | Status: DC | PRN

## 2020-04-09 MED ORDER — SULFAMETHOXAZOLE-TRIMETHOPRIM 800-160 MG PO TABS: 1.0000 | ORAL_TABLET | Freq: Two times a day (BID) | ORAL | 0 refills | Status: AC

## 2020-04-09 MED ORDER — ONDANSETRON 4 MG PO TBDP
4.0000 mg | ORAL_TABLET | Freq: Three times a day (TID) | ORAL | Status: DC | PRN
  Administered 2020-04-09: 4 mg via ORAL
  Filled 2020-04-09: qty 1

## 2020-04-09 MED ORDER — INSULIN ASPART 100 UNIT/ML FLEXPEN: 1.0000 [IU] | PEN_INJECTOR | Freq: Three times a day (TID) | SUBCUTANEOUS | 11 refills | Status: DC

## 2020-04-09 MED ORDER — BLOOD GLUCOSE MONITOR KIT: PACK | 0 refills | Status: DC

## 2020-04-09 MED ORDER — SENNOSIDES-DOCUSATE SODIUM 8.6-50 MG PO TABS: 1.0000 | ORAL_TABLET | Freq: Two times a day (BID) | ORAL | Status: DC

## 2020-04-09 NOTE — Progress Notes (Signed)
Discharge package printed and instructions given, patient verbalizes understanding. Patient states she know how to given insulin to herself.

## 2020-04-09 NOTE — Discharge Summary (Addendum)
Physician Discharge Summary   Laurie Andrews MMH:[id number] DOB: January 02, 1971 DOA: 04/07/2020  PCP: Merryl Hacker, No  Admit date: 04/07/2020 Discharge date: 04/09/2020  Admitted From: home Disposition:  home Discharging physician: Dwyane Dee, MD  Recommendations for Outpatient Follow-up:  1. Follow up constipation status 2. Consider gastric emptying study 3. Needs adjustment of diabetes regimen; A1c 13.7 %   Patient discharged to home in Discharge Condition: stable Risk of unplanned readmission score: Unplanned Admission- Pilot do not use: 12.93  CODE STATUS: Full Diet recommendation:  Diet Orders (From admission, onward)    Start     Ordered   04/09/20 0000  Diet Carb Modified        04/09/20 1031   04/08/20 2140  Diet heart healthy/carb modified Room service appropriate? Yes; Fluid consistency: Thin  Diet effective now       Question Answer Comment  Diet-HS Snack? Nothing   Room service appropriate? Yes   Fluid consistency: Thin      04/08/20 2139          Hospital Course:  Patient is a 50 year old female with PMH poorly controlled type 2 diabetes, hypertension, asthma who presented to the hospital with worsening nausea/vomiting and right-sided abdominal pain along with right lower back pain. She had been seen in the ER for similar on 04/05/2020 and was discharged home after unremarkable work-up. There was concern for possible right-sided pyelonephritis although CT abdomen/pelvis on 04/05/2020 was unremarkable. 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  Urine culture ultimately did not grow.  She was discharged with a course of Bactrim to complete empirically.  Furthermore, she also is on chronic oxycodone (approx 5 mg TIDPRN), her A1c was checked and is 13.7% (home regimen glipizide and Trulicity). Unsure how well she follows a carb consistent diet.  No  med adjustments were made and she was recommended to follow up outpatient for management.   Personally reviewed her abdominal x-ray as well as CT abdomen/pelvis from 04/05/2020.  There is at least moderate stool burden consistent with constipation likely contributed to by chronic opioid use which may also be causing some of her nausea/vomiting.  Given her elevated A1c, there may also be a component of underlying gastroparesis.  Patient was not very forthcoming with providing details on her eating habits when questioned. If no significant improvement in her symptoms after antibiotic course, further consideration should be given to performing gastric emptying study.  She was also recommended to follow a gastroparesis diet at discharge regardless.  The patient's chronic medical conditions were treated accordingly per the patient's home medication regimen except as noted.  On day of discharge, patient was felt deemed stable for discharge. Patient/family member advised to call PCP or come back to ER if needed.   Principal Diagnosis: Nausea & vomiting  Discharge Diagnoses: Active Hospital Problems   Diagnosis Date Noted  . Acute pyelonephritis 04/09/2020  . Constipation 04/09/2020  . Opioid dependence ([place]) 04/09/2020  . Type 2 diabetes mellitus with hyperglycemia ([place]) 04/08/2020  . Essential hypertension 04/08/2020    Resolved Hospital Problems   Diagnosis Date Noted Date Resolved  . Nausea & vomiting 04/08/2020 04/09/2020  . Intractable nausea and vomiting 04/08/2020 04/09/2020    Discharge Instructions    Diet Carb Modified   Complete by: As directed    Increase activity slowly   Complete by: As directed  Allergies as of 04/09/2020      Reactions   Keflex [cephalexin] Nausea And Vomiting   Aspirin Nausea And Vomiting, Rash   Morphine And Related Itching, Rash   Penicillins Nausea And Vomiting, Rash      Medication List    STOP taking these medications   Lidocaine 4 %  Ptch Commonly known as: HM Lidocaine Patch   ondansetron 4 MG tablet Commonly known as: ZOFRAN   promethazine-dextromethorphan 6.25-15 MG/5ML syrup Commonly known as: PROMETHAZINE-DM   sucralfate 1 GM/10ML suspension Commonly known as: Carafate     TAKE these medications   albuterol 108 (90 Base) MCG/ACT inhaler Commonly known as: VENTOLIN HFA Inhale 1-2 puffs into the lungs daily as needed for wheezing or shortness of breath.   amitriptyline 100 MG tablet Commonly known as: ELAVIL Take 100 mg by mouth as needed for sleep.   blood glucose meter kit and supplies Kit Dispense based on patient and insurance preference. Use up to four times daily as directed. (FOR ICD-9 250.00, 250.01). What changed: Another medication with the same name was added. Make sure you understand how and when to take each.   blood glucose meter kit and supplies Kit Dispense based on patient and insurance preference. Use up to four times daily as directed. What changed: You were already taking a medication with the same name, and this prescription was added. Make sure you understand how and when to take each.   cetirizine 10 MG tablet Commonly known as: ZYRTEC Take 10 mg by mouth daily.   Cholecalciferol 25 MCG (1000 UT) capsule Take 1,000 Units by mouth daily.   clonazePAM 0.5 MG tablet Commonly known as: KLONOPIN Take 0.5 mg by mouth daily.   diclofenac Sodium 1 % Gel Commonly known as: Voltaren Apply 2 g topically 4 (four) times daily.   GaviLAX 17 GM/SCOOP powder Generic drug: polyethylene glycol powder Take 17 g by mouth daily as needed for mild constipation.   glipiZIDE 10 MG tablet Commonly known as: GLUCOTROL Take 20 mg by mouth daily before breakfast.   insulin aspart 100 UNIT/ML FlexPen Commonly known as: NOVOLOG Inject 1-9 Units into the skin 3 (three) times daily with meals. Glucose 121 - 150: 1 unit, Glucose 151 - 200: 2 units, Glucose 201 - 250: 3 units, Glucose 251 - 300: 5  units, Glucose 301 - 350: 7 units, Glucose 351 - 400: 9 units, Glucose > 400 call MD   losartan 50 MG tablet Commonly known as: COZAAR Take 50 mg by mouth daily.   montelukast 10 MG tablet Commonly known as: SINGULAIR Take 10 mg by mouth daily.   naratriptan 2.5 MG tablet Commonly known as: AMERGE Take 2.5 mg by mouth as needed for migraine.   nystatin cream Commonly known as: MYCOSTATIN Apply 1 application topically 3 (three) times daily as needed for dry skin.   ondansetron 4 MG disintegrating tablet Commonly known as: Zofran ODT Take 1 tablet (4 mg total) by mouth every 8 (eight) hours as needed for nausea or vomiting.   oxyCODONE 5 MG immediate release tablet Commonly known as: Oxy IR/ROXICODONE Take 5 mg by mouth in the morning, at noon, and at bedtime.   pantoprazole 40 MG tablet Commonly known as: PROTONIX Take 40 mg by mouth 2 (two) times daily.   senna-docusate 8.6-50 MG tablet Commonly known as: Senokot-S Take 1 tablet by mouth 2 (two) times daily.   sulfamethoxazole-trimethoprim 800-160 MG tablet Commonly known as: BACTRIM DS Take 1 tablet by mouth  2 (two) times daily for 10 days.   topiramate 25 MG tablet Commonly known as: TOPAMAX Take 50 mg by mouth 2 (two) times daily.   Trulicity 1.61 WR/6.0AV Sopn Generic drug: Dulaglutide Inject 0.75 mg as directed once a week. Wednesdays       Follow-up Information    [facility]. Go on 05/08/2020.   Why: Post hospital follow and to establish primary care scheduled for 05/08/2020 @ 9:30 am Dr. Barbera Setters information: [place] [phone]             Allergies  Allergen Reactions  . Keflex [Cephalexin] Nausea And Vomiting  . Aspirin Nausea And Vomiting and Rash  . Morphine And Related Itching and Rash  . Penicillins Nausea And Vomiting and Rash    Consultations: n/a  Discharge Exam: BP (!) 167/74   Pulse 75    Temp 98.5 F (36.9 C) (Oral)   Resp 17   SpO2 100%  General appearance: alert, cooperative and no distress Head: Normocephalic, without obvious abnormality, atraumatic Eyes: EOMI Lungs: clear to auscultation bilaterally Heart: regular rate and rhythm and S1, S2 normal Abdomen: normal findings: bowel sounds normal and soft, non-tender  Back: mild R CVA TTP but improved from prior exam Extremities: no edema Skin: mobility and turgor normal Neurologic: Grossly normal  The results of significant diagnostics from this hospitalization (including imaging, microbiology, ancillary and laboratory) are listed below for reference.   Microbiology: Recent Results (from the past 240 hour(s))  Urine culture     Status: Abnormal   Collection Time: 04/07/20 11:22 PM   Specimen: Urine, Random  Result Value Ref Range Status   Specimen Description URINE, RANDOM  Final   Special Requests   Final    URINE, RANDOM Performed at [facility], [address], [place], [place] [address]    Culture MULTIPLE SPECIES PRESENT, SUGGEST RECOLLECTION (A)  Final   Report Status 04/09/2020 FINAL  Final  SARS CORONAVIRUS 2 (TAT 6-24 HRS) Nasopharyngeal Nasopharyngeal Swab     Status: None   Collection Time: 04/08/20  4:38 AM   Specimen: Nasopharyngeal Swab  Result Value Ref Range Status   SARS Coronavirus 2 NEGATIVE NEGATIVE Final    Comment: (NOTE) SARS-CoV-2 target nucleic acids are NOT DETECTED.  The SARS-CoV-2 RNA is generally detectable in upper and lower respiratory specimens during the acute phase of infection. Negative results do not preclude SARS-CoV-2 infection, do not rule out co-infections with other pathogens, and should not be used as the sole basis for treatment or other patient management decisions. Negative results must be combined with clinical observations, patient history, and epidemiological information. The expected result is Negative.  Fact Sheet for  Patients: SugarRoll.be  Fact Sheet for Healthcare Providers: https://www.woods-mathews.com/  This test is not yet approved or cleared by the [place] FDA and  has been authorized for detection and/or diagnosis of SARS-CoV-2 by FDA under an Emergency Use Authorization (EUA). This EUA will remain  in effect (meaning this test can be used) for the duration of the COVID-19 declaration under Se ction 564(b)(1) of the Act, 21 U.S.C. section 360bbb-3(b)(1), unless the authorization is terminated or revoked sooner.  Performed at [facility], [place] [address], [place], [place] [address]      Labs: BNP (last 3 results) No results for input(s): BNP in the last 8760 hours. Basic Metabolic Panel: Recent Labs  Lab 04/05/20 1259 04/07/20 2247 04/08/20 9528  04/09/20 0428  NA 132* 134* 137 136  K 4.7 4.2 3.8 3.9  CL 97* 99 105 104  CO2 _0 20*  GLUCOSE 403* 250* 259* 191*  BUN _1 CREATININE 0.69 0.68 0.55 0.65  CALCIUM 9.5 9.3 8.5* 9.3  MG  --   --   --  1.9   Liver Function Tests: Recent Labs  Lab 04/05/20 1259 04/07/20 2247 04/08/20 0618  AST _2 ALT _3 ALKPHOS 129* 135* 108  BILITOT 0.9 1.0 1.2  PROT 7.3 7.5 5.9*  ALBUMIN 3.8 3.8 3.0*   Recent Labs  Lab 04/05/20 1259 04/07/20 2247  LIPASE 34 30   No results for input(s): AMMONIA in the last 168 hours. CBC: Recent Labs  Lab 04/05/20 1259 04/07/20 2247 04/09/20 0428  WBC 5.3 7.4 6.2  NEUTROABS  --  4.6 3.2  HGB 13.9 14.4 13.4  HCT 45.2 45.9 42.8  MCV 82.2 80.8 81.8  PLT 369 409* 366   Cardiac Enzymes: No results for input(s): CKTOTAL, CKMB, CKMBINDEX, TROPONINI in the last 168 hours. BNP: Invalid input(s): POCBNP CBG: Recent Labs  Lab 04/08/20 1102 04/08/20 1644 04/08/20 2205 04/09/20 0652 04/09/20 1207  GLUCAP 244* 140* 189* 201* 203*   D-Dimer No results for input(s): DDIMER in the last 72 hours. Hgb A1c Recent Labs     04/08/20 0855  HGBA1C 13.7*   Lipid Profile No results for input(s): CHOL, HDL, LDLCALC, TRIG, CHOLHDL, LDLDIRECT in the last 72 hours. Thyroid function studies No results for input(s): TSH, T4TOTAL, T3FREE, THYROIDAB in the last 72 hours.  Invalid input(s): FREET3 Anemia work up No results for input(s): VITAMINB12, FOLATE, FERRITIN, TIBC, IRON, RETICCTPCT in the last 72 hours. Urinalysis    Component Value Date/Time   COLORURINE AMBER (A) 04/07/2020 2322   APPEARANCEUR CLOUDY (A) 04/07/2020 2322   LABSPEC 1.028 04/07/2020 2322   PHURINE 6.0 04/07/2020 2322   GLUCOSEU >=500 (A) 04/07/2020 2322   HGBUR NEGATIVE 04/07/2020 2322   BILIRUBINUR NEGATIVE 04/07/2020 2322   KETONESUR 80 (A) 04/07/2020 2322   PROTEINUR 30 (A) 04/07/2020 2322   NITRITE NEGATIVE 04/07/2020 2322   LEUKOCYTESUR LARGE (A) 04/07/2020 2322   Sepsis Labs Invalid input(s): PROCALCITONIN,  WBC,  LACTICIDVEN Microbiology Recent Results (from the past 240 hour(s))  Urine culture     Status: Abnormal   Collection Time: 04/07/20 11:22 PM   Specimen: Urine, Random  Result Value Ref Range Status   Specimen Description URINE, RANDOM  Final   Special Requests   Final    URINE, RANDOM Performed at [facility], [address] [place], [place] [address]    Culture MULTIPLE SPECIES PRESENT, SUGGEST RECOLLECTION (A)  Final   Report Status 04/09/2020 FINAL  Final  SARS CORONAVIRUS 2 (TAT 6-24 HRS) Nasopharyngeal Nasopharyngeal Swab     Status: None   Collection Time: 04/08/20  4:38 AM   Specimen: Nasopharyngeal Swab  Result Value Ref Range Status   SARS Coronavirus 2 NEGATIVE NEGATIVE Final    Comment: (NOTE) SARS-CoV-2 target nucleic acids are NOT DETECTED.  The SARS-CoV-2 RNA is generally detectable in upper and lower respiratory specimens during the acute phase of infection. Negative results do not preclude SARS-CoV-2 infection, do not rule out co-infections with other pathogens, and should not be  used as the sole basis for treatment or other patient management decisions. Negative results must be combined with clinical observations, patient history, and epidemiological information. The  expected result is Negative.  Fact Sheet for Patients: SugarRoll.be  Fact Sheet for Healthcare Providers: https://www.woods-mathews.com/  This test is not yet approved or cleared by the [place] FDA and  has been authorized for detection and/or diagnosis of SARS-CoV-2 by FDA under an Emergency Use Authorization (EUA). This EUA will remain  in effect (meaning this test can be used) for the duration of the COVID-19 declaration under Se ction 564(b)(1) of the Act, 21 U.S.C. section 360bbb-3(b)(1), unless the authorization is terminated or revoked sooner.  Performed at [facility], [place] [address], [place], [place] [address]     Procedures/Studies: DG Abd 1 View  Result Date: 04/08/2020 CLINICAL DATA:  Vomiting, abdominal pain six EXAM: ABDOMEN - 1 VIEW COMPARISON:  CT 04/05/2020 FINDINGS: Normal abdominal gas pattern. No gross free intraperitoneal gas. Mild hepatomegaly. Cholecystectomy clips are seen in the right upper quadrant. Rim calcified lymph node seen within the right hemipelvis. IMPRESSION: Normal abdominal gas pattern. Electronically Signed   By: Fidela Salisbury MD   On: 04/08/2020 06:46   CT ABDOMEN PELVIS W CONTRAST  Result Date: 04/05/2020 CLINICAL DATA:  Right lower quadrant pain, possible appendicitis or kidney stone. EXAM: CT ABDOMEN AND PELVIS WITH CONTRAST TECHNIQUE: Multidetector CT imaging of the abdomen and pelvis was performed using the standard protocol following bolus administration of intravenous contrast. CONTRAST:  134m OMNIPAQUE IOHEXOL 300 MG/ML  SOLN COMPARISON:  None. FINDINGS: Lower chest: Lung bases are clear. Hepatobiliary: Mild hepatic steatosis without focal mass. Previous cholecystectomy. Biliary tree is normal.  Pancreas: Normal. Spleen: Normal. Adrenals/Urinary Tract: Adrenal glands are normal. Kidneys are normal size without hydronephrosis or nephrolithiasis. 1 cm cyst over the upper pole left kidney. Ureters are normal. Bladder is normal size. There is minimal patchy increased density along the posterior dependent bladder likely early contrast filling. Stomach/Bowel: Stomach and small bowel are normal. Appendix is normal. Colon is normal. Vascular/Lymphatic: Abdominal aorta is normal in caliber. No adenopathy. Reproductive: Normal. Other: No free fluid or focal inflammatory change. Musculoskeletal: No focal abnormality. IMPRESSION: 1. No acute findings in the abdomen/pelvis. Normal appendix. 2. 1 cm left renal cyst. 3. Mild hepatic steatosis without focal mass. Electronically Signed   By: DMarin OlpM.D.   On: 04/05/2020 15:33     Time coordinating discharge: Over 30 minutes    DDwyane Dee MD  [facility] 04/09/2020, 4:38 PM

## 2020-04-09 NOTE — Plan of Care (Signed)
  Problem: Activity: Goal: Risk for activity intolerance will decrease Outcome: Progressing   Problem: Elimination: Goal: Will not experience complications related to bowel motility Outcome: Progressing Goal: Will not experience complications related to urinary retention Outcome: Progressing   Problem: Safety: Goal: Ability to remain free from injury will improve Outcome: Progressing   Problem: Pain Managment: Goal: General experience of comfort will improve Outcome: Progressing   

## 2020-05-08 ENCOUNTER — Other Ambulatory Visit: Payer: Self-pay

## 2020-05-08 ENCOUNTER — Ambulatory Visit: Payer: Medicare (Managed Care) | Attending: Internal Medicine | Admitting: Internal Medicine

## 2020-05-08 ENCOUNTER — Encounter: Payer: Self-pay | Admitting: Internal Medicine

## 2020-05-08 VITALS — BP 149/84 | HR 81 | Resp 16 | Ht 61.0 in | Wt 165.0 lb

## 2020-05-08 DIAGNOSIS — E66811 Obesity, class 1: Secondary | ICD-10-CM | POA: Insufficient documentation

## 2020-05-08 DIAGNOSIS — I10 Essential (primary) hypertension: Secondary | ICD-10-CM | POA: Diagnosis not present

## 2020-05-08 DIAGNOSIS — E669 Obesity, unspecified: Secondary | ICD-10-CM

## 2020-05-08 DIAGNOSIS — E1165 Type 2 diabetes mellitus with hyperglycemia: Secondary | ICD-10-CM

## 2020-05-08 DIAGNOSIS — E1142 Type 2 diabetes mellitus with diabetic polyneuropathy: Secondary | ICD-10-CM | POA: Diagnosis not present

## 2020-05-08 DIAGNOSIS — J4540 Moderate persistent asthma, uncomplicated: Secondary | ICD-10-CM | POA: Diagnosis not present

## 2020-05-08 DIAGNOSIS — L309 Dermatitis, unspecified: Secondary | ICD-10-CM

## 2020-05-08 DIAGNOSIS — Z23 Encounter for immunization: Secondary | ICD-10-CM | POA: Insufficient documentation

## 2020-05-08 DIAGNOSIS — M172 Bilateral post-traumatic osteoarthritis of knee: Secondary | ICD-10-CM | POA: Insufficient documentation

## 2020-05-08 LAB — GLUCOSE, POCT (MANUAL RESULT ENTRY): POC Glucose: 280 mg/dl — AB (ref 70–99)

## 2020-05-08 MED ORDER — LOSARTAN POTASSIUM 100 MG PO TABS: 100.0000 mg | ORAL_TABLET | Freq: Every day | ORAL | 6 refills | Status: DC

## 2020-05-08 MED ORDER — LANTUS SOLOSTAR 100 UNIT/ML ~~LOC~~ SOPN: 10.0000 [IU] | PEN_INJECTOR | Freq: Every day | SUBCUTANEOUS | 11 refills | Status: DC

## 2020-05-08 MED ORDER — INSULIN ASPART 100 UNIT/ML FLEXPEN
PEN_INJECTOR | SUBCUTANEOUS | 11 refills | Status: DC
Start: 2020-05-08 — End: 2020-07-10

## 2020-05-08 MED ORDER — TRIAMCINOLONE ACETONIDE 0.1 % EX CREA: 1.0000 "application " | TOPICAL_CREAM | Freq: Two times a day (BID) | CUTANEOUS | 0 refills | Status: DC

## 2020-05-08 MED ORDER — ALBUTEROL SULFATE (2.5 MG/3ML) 0.083% IN NEBU: 2.5000 mg | INHALATION_SOLUTION | Freq: Four times a day (QID) | RESPIRATORY_TRACT | 1 refills | Status: DC | PRN

## 2020-05-08 MED ORDER — PEN NEEDLES 31G X 8 MM MISC: 6 refills | Status: DC

## 2020-05-08 NOTE — Patient Instructions (Addendum)
Please sign a release for [place] to get your medical records from your previous provider in [place].  Start Trulicity. Decrease glipizide to 10 mg daily. Start Lantus insulin 10 units at bedtime. Start NovoLog insulin 3 units with meals. Continue to monitor your blood sugars at least twice a day before meals and bring your log with you on your next visit.  Increase the blood pressure medication Losartan to 100 mg daily.  Remember to bring your other inhaler with you on the next visit.   Diabetes Mellitus and Nutrition, Adult When you have diabetes, or diabetes mellitus, it is very important to have healthy eating habits because your blood sugar (glucose) levels are greatly affected by what you eat and drink. Eating healthy foods in the right amounts, at about the same times every day, can help you:  Control your blood glucose.  Lower your risk of heart disease.  Improve your blood pressure.  Reach or maintain a healthy weight. What can affect my meal plan? Every person with diabetes is different, and each person has different needs for a meal plan. Your health care provider may recommend that you work with a dietitian to make a meal plan that is best for you. Your meal plan may vary depending on factors such as:  The calories you need.  The medicines you take.  Your weight.  Your blood glucose, blood pressure, and cholesterol levels.  Your activity level.  Other health conditions you have, such as heart or kidney disease. How do carbohydrates affect me? Carbohydrates, also called carbs, affect your blood glucose level more than any other type of food. Eating carbs naturally raises the amount of glucose in your blood. Carb counting is a method for keeping track of how many carbs you eat. Counting carbs is important to keep your blood glucose at a healthy level, especially if you use insulin or take certain oral diabetes medicines. It is important to know how many carbs you can  safely have in each meal. This is different for every person. Your dietitian can help you calculate how many carbs you should have at each meal and for each snack. How does alcohol affect me? Alcohol can cause a sudden decrease in blood glucose (hypoglycemia), especially if you use insulin or take certain oral diabetes medicines. Hypoglycemia can be a life-threatening condition. Symptoms of hypoglycemia, such as sleepiness, dizziness, and confusion, are similar to symptoms of having too much alcohol.  Do not drink alcohol if: ? Your health care provider tells you not to drink. ? You are pregnant, may be pregnant, or are planning to become pregnant.  If you drink alcohol: ? Do not drink on an empty stomach. ? Limit how much you use to:  0-1 drink a day for women.  0-2 drinks a day for men. ? Be aware of how much alcohol is in your drink. In the [place] one drink equals one 12 oz bottle of beer (355 mL), one 5 oz glass of wine (148 mL), or one 1 oz glass of hard liquor (44 mL). ? Keep yourself hydrated with water, diet soda, or unsweetened iced tea.  Keep in mind that regular soda, juice, and other mixers may contain a lot of sugar and must be counted as carbs. What are tips for following this plan? Reading food labels  Start by checking the serving size on the "Nutrition Facts" label of packaged foods and drinks. The amount of calories, carbs, fats, and other nutrients listed on the label  is based on one serving of the item. Many items contain more than one serving per package.  Check the total grams (g) of carbs in one serving. You can calculate the number of servings of carbs in one serving by dividing the total carbs by 15. For example, if a food has 30 g of total carbs per serving, it would be equal to 2 servings of carbs.  Check the number of grams (g) of saturated fats and trans fats in one serving. Choose foods that have a low amount or none of these fats.  Check the number of  milligrams (mg) of salt (sodium) in one serving. Most people should limit total sodium intake to less than 2,300 mg per day.  Always check the nutrition information of foods labeled as "low-fat" or "nonfat." These foods may be higher in added sugar or refined carbs and should be avoided.  Talk to your dietitian to identify your daily goals for nutrients listed on the label. Shopping  Avoid buying canned, pre-made, or processed foods. These foods tend to be high in fat, sodium, and added sugar.  Shop around the outside edge of the grocery store. This is where you will most often find fresh fruits and vegetables, bulk grains, fresh meats, and fresh dairy. Cooking  Use low-heat cooking methods, such as baking, instead of high-heat cooking methods like deep frying.  Cook using healthy oils, such as olive, canola, or sunflower oil.  Avoid cooking with butter, cream, or high-fat meats. Meal planning  Eat meals and snacks regularly, preferably at the same times every day. Avoid going long periods of time without eating.  Eat foods that are high in fiber, such as fresh fruits, vegetables, beans, and whole grains. Talk with your dietitian about how many servings of carbs you can eat at each meal.  Eat 4-6 oz (112-168 g) of lean protein each day, such as lean meat, chicken, fish, eggs, or tofu. One ounce (oz) of lean protein is equal to: ? 1 oz (28 g) of meat, chicken, or fish. ? 1 egg. ?  cup (62 g) of tofu.  Eat some foods each day that contain healthy fats, such as avocado, nuts, seeds, and fish.   What foods should I eat? Fruits Berries. Apples. Oranges. Peaches. Apricots. Plums. Grapes. Mango. Papaya. Pomegranate. Kiwi. Cherries. Vegetables Lettuce. Spinach. Leafy greens, including kale, chard, collard greens, and mustard greens. Beets. Cauliflower. Cabbage. Broccoli. Carrots. Green beans. Tomatoes. Peppers. Onions. Cucumbers. Brussels sprouts. Grains Whole grains, such as whole-wheat  or whole-grain bread, crackers, tortillas, cereal, and pasta. Unsweetened oatmeal. Quinoa. Brown or wild rice. Meats and other proteins Seafood. Poultry without skin. Lean cuts of poultry and beef. Tofu. Nuts. Seeds. Dairy Low-fat or fat-free dairy products such as milk, yogurt, and cheese. The items listed above may not be a complete list of foods and beverages you can eat. Contact a dietitian for more information. What foods should I avoid? Fruits Fruits canned with syrup. Vegetables Canned vegetables. Frozen vegetables with butter or cream sauce. Grains Refined white flour and flour products such as bread, pasta, snack foods, and cereals. Avoid all processed foods. Meats and other proteins Fatty cuts of meat. Poultry with skin. Breaded or fried meats. Processed meat. Avoid saturated fats. Dairy Full-fat yogurt, cheese, or milk. Beverages Sweetened drinks, such as soda or iced tea. The items listed above may not be a complete list of foods and beverages you should avoid. Contact a dietitian for more information. Questions to ask a  health care provider  Do I need to meet with a diabetes educator?  Do I need to meet with a dietitian?  What number can I call if I have questions?  When are the best times to check my blood glucose? Where to find more information:  American Diabetes Association: diabetes.org  Academy of Nutrition and Dietetics: www.eatright.[employer] of Diabetes and Digestive and Kidney Diseases: CarFlippers.tn  Association of Diabetes Care and Education Specialists: www.diabeteseducator.org Summary  It is important to have healthy eating habits because your blood sugar (glucose) levels are greatly affected by what you eat and drink.  A healthy meal plan will help you control your blood glucose and maintain a healthy lifestyle.  Your health care provider may recommend that you work with a dietitian to make a meal plan that is best for  you.  Keep in mind that carbohydrates (carbs) and alcohol have immediate effects on your blood glucose levels. It is important to count carbs and to use alcohol carefully. This information is not intended to replace advice given to you by your health care provider. Make sure you discuss any questions you have with your health care provider. Document Revised: 01/02/2019 Document Reviewed: 01/02/2019 Elsevier Patient Education  2021 Else[employer] Inc.   Influenza Virus Vaccine injection (Fluarix) What is this medicine? INFLUENZA VIRUS VACCINE (in floo EN zuh VAHY ruhs vak SEEN) helps to reduce the risk of getting influenza also known as the flu. This medicine may be used for other purposes; ask your health care provider or pharmacist if you have questions. COMMON BRAND NAME(S): Fluarix, Fluzone What should I tell my health care provider before I take this medicine? They need to know if you have any of these conditions:  bleeding disorder like hemophilia  fever or infection  Guillain-Barre syndrome or other neurological problems  immune system problems  infection with the human immunodeficiency virus (HIV) or AIDS  low blood platelet counts  multiple sclerosis  an unusual or allergic reaction to influenza virus vaccine, eggs, chicken proteins, latex, gentamicin, other medicines, foods, dyes or preservatives  pregnant or trying to get pregnant  breast-feeding How should I use this medicine? This vaccine is for injection into a muscle. It is given by a health care professional. A copy of Vaccine Information Statements will be given before each vaccination. Read this sheet carefully each time. The sheet may change frequently. Talk to your pediatrician regarding the use of this medicine in children. Special care may be needed. Overdosage: If you think you have taken too much of this medicine contact a poison control center or emergency room at once. NOTE: This medicine is only for you.  Do not share this medicine with others. What if I miss a dose? This does not apply. What may interact with this medicine?  chemotherapy or radiation therapy  medicines that lower your immune system like etanercept, anakinra, infliximab, and adalimumab  medicines that treat or prevent blood clots like warfarin  phenytoin  steroid medicines like prednisone or cortisone  theophylline  vaccines This list may not describe all possible interactions. Give your health care provider a list of all the medicines, herbs, non-prescription drugs, or dietary supplements you use. Also tell them if you smoke, drink alcohol, or use illegal drugs. Some items may interact with your medicine. What should I watch for while using this medicine? Report any side effects that do not go away within 3 days to your doctor or health care professional. Call your health  care provider if any unusual symptoms occur within 6 weeks of receiving this vaccine. You may still catch the flu, but the illness is not usually as bad. You cannot get the flu from the vaccine. The vaccine will not protect against colds or other illnesses that may cause fever. The vaccine is needed every year. What side effects may I notice from receiving this medicine? Side effects that you should report to your doctor or health care professional as soon as possible:  allergic reactions like skin rash, itching or hives, swelling of the face, lips, or tongue Side effects that usually do not require medical attention (report to your doctor or health care professional if they continue or are bothersome):  fever  headache  muscle aches and pains  pain, tenderness, redness, or swelling at site where injected  weak or tired This list may not describe all possible side effects. Call your doctor for medical advice about side effects. You may report side effects to FDA at 1-800-FDA-1088. Where should I keep my medicine? This vaccine is only given in  a clinic, pharmacy, doctor's office, or other health care setting and will not be stored at home. NOTE: This sheet is a summary. It may not cover all possible information. If you have questions about this medicine, talk to your doctor, pharmacist, or health care provider.  2021 Elsevier/Gold Standard (2007-08-23 09:30:40)

## 2020-05-08 NOTE — Progress Notes (Signed)
Patient ID: Laurie Andrews, female    DOB: [birth year]  MRN: [id number]  CC: Hospitalization Follow-up   Subjective: Laurie Andrews is a 50 y.o. female who presents for new hosp f/u Her concerns today include:  Patient with history of DM type II with peripheral neuropathy, HTN, opiate dependence, anxiety disorder, asthma, migraines, OA knees, alopecia  Previous PC was [facility] in [place].   Relocated about 1 yr ago.  Patient hospitalized 2/28-04/09/2020 with worsening nausea/vomiting and right-sided abdominal pain with right lower back pain.  She was seen in the emergency room for the same on 04/07/2020 and was discharged home after unremarkable work-up. -She was admitted for possible pyelonephritis.  UA positive for LE, 21-50 WBCs and negative nitrate.  CT of the abdomen and pelvis showed no acute findings.  Urine culture showed multiple species with no predominant organism.  A1c was elevated at 13.7.  She was on glipizide and Trulicity prior to admission.  NovoLog sliding scale added on discharge.  Today: Patient reports that she still gets nausea and sometimes vomiting in the mornings when she has breakfast.  Abdominal pain has resolved.  History of cholecystectomy.  DM:  Lab Results  Component Value Date   HGBA1C 13.7 (H) 04/08/2020   Results for orders placed or performed in visit on 05/08/20  POCT glucose (manual entry)  Result Value Ref Range   POC Glucose 280 (A) 70 - 99 mg/dl    She has been on Trulicity for close to 2 years.  Also on glipizide.  Reports compliance.  Never did start NovoLog because they forgot to prescribe the needles for the pen.  Did not tolerate Metformin in the past due to GI upset. -Reports 100 pound weight loss to date.  Current weight is 165 pounds.  Decreased appetite.  Intermittent vomiting in the mornings. -Checks blood sugars TID before meals.  Range 200-280. -When asked whether she exercise, patient states she works at [employer] and  does a lot of standing. -Reports neuropathy symptoms in feet.  HTN: On Cozaar 50 mg daily which she takes consistently and took already for the morning. No lower extremity edema.  No chest pains.  Some intermittent shortness of breath which she relates to asthma.  Asthma: She is on a maintenance inhaler but does not recall the name.  She still has some at home.  She will bring the name with her on subsequent visit.  Uses albuterol inhaler about once a day.  Also has a nebulizer machine which she uses only when she feels she has a flareup but not every day.  Needing refill on nebulizer treatments.  Chronic knee pain: Complains of chronic pain in both knees for quite some time. Has had injuries to both knees -Had arthroscopy on left knee 1.5 yrs ago for torn structure and arthritis.  Surgery was not as successful as she had hope.  Had injections to the knee which have not helped.  Told she would need additional surgery. -Reports having significant arthritis in the right knee for which she needs TKR.  Pain with walking and prolonged standing. -On oxycodone 5 mg 3 times daily through her previous PCP in [place].  States that she is still on a controlled substance prescribing agreement with her and she has continued to refill the prescription for her until she was able to get established with a provider here.  Recently filled last prescription.  Complains of dry itchy rash on the mid posterior thorax which flared up recently.  She has been rubbing Vaseline on it but still itches.  She is requesting a cream to use for it.  Gives history of eczema.  History of anxiety: I noticed that she has clonazepam on her med list.  Patient states that she was seen a counselor Dr. Portable in [place] and was on clonazepam in the past but has not been on that in a while.  She is not requesting that at this time. Patient Active Problem List   Diagnosis Date Noted  . Influenza vaccine needed 05/08/2020  .  Acute pyelonephritis 04/09/2020  . Constipation 04/09/2020  . Opioid dependence ([place]) 04/09/2020  . Type 2 diabetes mellitus with hyperglycemia ([place]) 04/08/2020  . Essential hypertension 04/08/2020     Current Outpatient Medications on File Prior to Visit  Medication Sig Dispense Refill  . albuterol (VENTOLIN HFA) 108 (90 Base) MCG/ACT inhaler Inhale 1-2 puffs into the lungs daily as needed for wheezing or shortness of breath.    Marland Kitchen amitriptyline (ELAVIL) 100 MG tablet Take 100 mg by mouth as needed for sleep.    . blood glucose meter kit and supplies KIT Dispense based on patient and insurance preference. Use up to four times daily as directed. (FOR ICD-9 250.00, 250.01). 1 each 0  . blood glucose meter kit and supplies KIT Dispense based on patient and insurance preference. Use up to four times daily as directed. 1 each 0  . cetirizine (ZYRTEC) 10 MG tablet Take 10 mg by mouth daily.    . Cholecalciferol 25 MCG (1000 UT) capsule Take 1,000 Units by mouth daily.    . clonazePAM (KLONOPIN) 0.5 MG tablet Take 0.5 mg by mouth daily.    . diclofenac Sodium (VOLTAREN) 1 % GEL Apply 2 g topically 4 (four) times daily. 50 g 0  . Dulaglutide (TRULICITY) 8.29 HB/7.1IR SOPN Inject 0.75 mg as directed once a week. Wednesdays    . GAVILAX 17 GM/SCOOP powder Take 17 g by mouth daily as needed for mild constipation.    Marland Kitchen glipiZIDE (GLUCOTROL) 10 MG tablet Take 20 mg by mouth daily before breakfast.    . insulin aspart (NOVOLOG) 100 UNIT/ML FlexPen Inject 1-9 Units into the skin 3 (three) times daily with meals. Glucose 121 - 150: 1 unit, Glucose 151 - 200: 2 units, Glucose 201 - 250: 3 units, Glucose 251 - 300: 5 units, Glucose 301 - 350: 7 units, Glucose 351 - 400: 9 units, Glucose > 400 call MD 15 mL 11  . losartan (COZAAR) 50 MG tablet Take 50 mg by mouth daily.    . montelukast (SINGULAIR) 10 MG tablet Take 10 mg by mouth daily.    . naratriptan (AMERGE) 2.5 MG tablet Take 2.5 mg by mouth as needed for  migraine.    . nystatin cream (MYCOSTATIN) Apply 1 application topically 3 (three) times daily as needed for dry skin.    Marland Kitchen ondansetron (ZOFRAN ODT) 4 MG disintegrating tablet Take 1 tablet (4 mg total) by mouth every 8 (eight) hours as needed for nausea or vomiting. 20 tablet 1  . oxyCODONE (OXY IR/ROXICODONE) 5 MG immediate release tablet Take 5 mg by mouth in the morning, at noon, and at bedtime.    . pantoprazole (PROTONIX) 40 MG tablet Take 40 mg by mouth 2 (two) times daily.    Marland Kitchen senna-docusate (SENOKOT-S) 8.6-50 MG tablet Take 1 tablet by mouth 2 (two) times daily.    Marland Kitchen topiramate (TOPAMAX) 25 MG tablet Take 50 mg by mouth 2 (two)  times daily.     No current facility-administered medications on file prior to visit.    Allergies  Allergen Reactions  . Keflex [Cephalexin] Nausea And Vomiting  . Aspirin Nausea And Vomiting and Rash  . Morphine And Related Itching and Rash  . Penicillins Nausea And Vomiting and Rash    Social History   Socioeconomic History  . Marital status: Single    Spouse name: Not on file  . Number of children: Not on file  . Years of education: Not on file  . Highest education level: Not on file  Occupational History  . Not on file  Tobacco Use  . Smoking status: Never Smoker  . Smokeless tobacco: Never Used  Vaping Use  . Vaping Use: Never used  Substance and Sexual Activity  . Alcohol use: Not Currently  . Drug use: Never  . Sexual activity: Not on file  Other Topics Concern  . Not on file  Social History Narrative  . Not on file   Social Determinants of Health   Financial Resource Strain: Not on file  Food Insecurity: Not on file  Transportation Needs: Not on file  Physical Activity: Not on file  Stress: Not on file  Social Connections: Not on file  Intimate Partner Violence: Not on file    Family History  Family history unknown: Yes    Past Surgical History:  Procedure Laterality Date  . BREAST SURGERY    . CHOLECYSTECTOMY     . KNEE SURGERY      ROS: Review of Systems Negative except as stated above  PHYSICAL EXAM: BP (!) 149/84   Pulse 81   Resp 16   Ht $R'5\' 1"'kg$  (1.549 m)   Wt 165 lb (74.8 kg)   SpO2 100%   BMI 31.18 kg/m   Wt Readings from Last 3 Encounters:  05/08/20 165 lb (74.8 kg)  09/07/19 175 lb 6.4 oz (79.6 kg)  09/05/19 (!) 226 lb (102.5 kg)   Physical Exam  General appearance - alert, well appearing, middle-aged older African-American female and in no distress Mental status - normal mood, behavior, speech, dress, motor activity, and thought processes Mouth - mucous membranes moist, pharynx normal without lesions Neck - supple, no significant adenopathy Chest - clear to auscultation, no wheezes, rales or rhonchi, symmetric air entry Heart - normal rate, regular rhythm, normal S1, S2, no murmurs, rubs, clicks or gallops Musculoskeletal -knees: Mild enlargement of both joints.  No point tenderness.  She has crepitus and popping on passive movements of both knees right greater than left.  \ Extremities -no lower extremity edema. Skin: No predominant rash seen at this time on the posterior thorax with some excoriation from scratching in the mid thorax left side.  CMP Latest Ref Rng & Units 04/09/2020 04/08/2020 04/07/2020  Glucose 70 - 99 mg/dL 191(H) 259(H) 250(H)  BUN 6 - 20 mg/dL $Remove'6 9 11  'QPIjcZT$ Creatinine 0.44 - 1.00 mg/dL 0.65 0.55 0.68  Sodium 135 - 145 mmol/L 136 137 134(L)  Potassium 3.5 - 5.1 mmol/L 3.9 3.8 4.2  Chloride 98 - 111 mmol/L 104 105 99  CO2 22 - 32 mmol/L 20(L) 22 24  Calcium 8.9 - 10.3 mg/dL 9.3 8.5(L) 9.3  Total Protein 6.5 - 8.1 g/dL - 5.9(L) 7.5  Total Bilirubin 0.3 - 1.2 mg/dL - 1.2 1.0  Alkaline Phos 38 - 126 U/L - 108 135(H)  AST 15 - 41 U/L - 17 24  ALT 0 - 44 U/L - 18 22  Lipid Panel  No results found for: CHOL, TRIG, HDL, CHOLHDL, VLDL, LDLCALC, LDLDIRECT  CBC    Component Value Date/Time   WBC 6.2 04/09/2020 0428   RBC 5.23 (H) 04/09/2020 0428   HGB 13.4  04/09/2020 0428   HCT 42.8 04/09/2020 0428   PLT 366 04/09/2020 0428   MCV 81.8 04/09/2020 0428   MCH 25.6 (L) 04/09/2020 0428   MCHC 31.3 04/09/2020 0428   RDW 12.1 04/09/2020 0428   LYMPHSABS 2.4 04/09/2020 0428   MONOABS 0.5 04/09/2020 0428   EOSABS 0.1 04/09/2020 0428   BASOSABS 0.0 04/09/2020 0428    ASSESSMENT AND PLAN: Patient establishing care with new provider 1. Type 2 diabetes mellitus with peripheral neuropathy (HCC) Not at goal Given the nausea and vomiting that she is having most mornings, I recommend stopping the Trulicity.  I went over with her how Trulicity works and why it may be causing the nausea/vomiting.   -Recommend starting Lantus insulin as basal 10 units at bedtime and NovoLog to take with meals.  -Decrease glipizide to 10 mg once a day.  We will plan to stop this on subsequent visit with titration of insulin.  -Went over signs and symptoms of hypoglycemia and how to treat.  Advised to purchase glucose tabs over-the-counter and always keep a pack with her to chew 3 of 4 as needed for any hypoglycemic episodes. -Informed that insulin will cause some weight gain -Continue to monitor blood sugars at least twice a day before meals with goal being 90-130. -Discussed and encourage healthy eating habits  -Clinical pharmacist to do insulin teaching today.  - POCT glucose (manual entry) - Microalbumin / creatinine urine ratio - insulin glargine (LANTUS SOLOSTAR) 100 UNIT/ML Solostar Pen; Inject 10 Units into the skin at bedtime.  Dispense: 15 mL; Refill: 11 - insulin aspart (NOVOLOG) 100 UNIT/ML FlexPen; 3 units subsut with meals  Dispense: 15 mL; Refill: 11 - Insulin Pen Needle (PEN NEEDLES) 31G X 8 MM MISC; UAD  Dispense: 100 each; Refill: 6  2. Obesity (BMI 30.0-34.9) Commended her on weight loss. Discussed and encourage healthy eating habits. Advised to eliminate sugary drinks from the diet, cut back on portion sizes of white carbohydrates, eat more lean white  meat and seafood instead of beef or pork, incorporate fresh fruits and vegetables into the diet. -Try to move as much as she can given the situation with her knees.  3. Essential hypertension Not at goal.  Increase Cozaar to 100 mg daily - losartan (COZAAR) 100 MG tablet; Take 1 tablet (100 mg total) by mouth daily.  Dispense: 30 tablet; Refill: 6  4. Moderate persistent asthma without complication Refill nebulizer treatment. Continue albuterol inhaler as needed.  Advised to bring the name and dose of her maintenance inhaler with her on subsequent visit so that I can update her medication list. - albuterol (PROVENTIL) (2.5 MG/3ML) 0.083% nebulizer solution; Take 3 mLs (2.5 mg total) by nebulization every 6 (six) hours as needed for wheezing or shortness of breath.  Dispense: 150 mL; Refill: 1  5. Osteoarthritis of both knees, post traumatic We discussed referral to orthopedics and to local pain specialist. Advised that we do not prescribe oxycodone, hydrocodone or morphine at our facility. Request that she sign a release for [place] to get her records from her previous PCP so that we can pass info on to the specialists - Ambulatory referral to Orthopedic Surgery - Ambulatory referral to Pain Clinic  6. Dermatitis We will give a trial  of triamcinolone - triamcinolone (KENALOG) 0.1 %; Apply 1 application topically 2 (two) times daily.  Dispense: 30 g; Refill: 0  7. Influenza vaccine needed - Flu Vaccine QUAD 6+ mos PF IM (Fluarix Quad PF)  Patient advised to sign a release for [place] to get her medical records from her previous PCP in [place].   Patient was given the opportunity to ask questions.  Patient verbalized understanding of the plan and was able to repeat key elements of the plan.   Orders Placed This Encounter  Procedures  . Microalbumin / creatinine urine ratio  . POCT glucose (manual entry)     Requested Prescriptions    No prescriptions requested or ordered in this  encounter    No follow-ups on file.  Karle Plumber, MD, FACP

## 2020-05-09 LAB — MICROALBUMIN / CREATININE URINE RATIO
Creatinine, Urine: 38.3 mg/dL
Microalb/Creat Ratio: 24 mg/g creat (ref 0–29)
Microalbumin, Urine: 9.2 ug/mL

## 2020-05-13 ENCOUNTER — Telehealth: Payer: Self-pay

## 2020-05-13 NOTE — Telephone Encounter (Signed)
Contacted pt to go over lab results pt is aware and doesn't have any questions or concerns 

## 2020-05-14 ENCOUNTER — Telehealth: Payer: Self-pay | Admitting: Internal Medicine

## 2020-05-14 NOTE — Telephone Encounter (Signed)
Phone call placed to patient today.  I received an FMLA form for her from [employer].  I inquired whether this FMLA was to cover her for the days when she was hospitalized.  Patient stated yes.  She reports being out of work from February 26 through March 5.  She return to work March 6.

## 2020-05-15 NOTE — Telephone Encounter (Signed)
Patient called today to see if paper work had been faxed to [employer] / Pt stated it is due by Monday / please advise

## 2020-05-20 ENCOUNTER — Encounter: Payer: Self-pay | Admitting: Orthopaedic Surgery

## 2020-05-20 ENCOUNTER — Ambulatory Visit (INDEPENDENT_AMBULATORY_CARE_PROVIDER_SITE_OTHER): Payer: Medicare (Managed Care) | Admitting: Orthopaedic Surgery

## 2020-05-20 ENCOUNTER — Ambulatory Visit: Payer: Self-pay

## 2020-05-20 ENCOUNTER — Ambulatory Visit (INDEPENDENT_AMBULATORY_CARE_PROVIDER_SITE_OTHER): Payer: Medicare (Managed Care)

## 2020-05-20 DIAGNOSIS — M1712 Unilateral primary osteoarthritis, left knee: Secondary | ICD-10-CM

## 2020-05-20 DIAGNOSIS — M1711 Unilateral primary osteoarthritis, right knee: Secondary | ICD-10-CM

## 2020-05-20 NOTE — Progress Notes (Signed)
Office Visit Note   Patient: Laurie Andrews           Date of Birth: 1970-06-28           MRN: [id number] Visit Date: 05/20/2020              Requested by: Marcine Matar, MD [address] [place],  [place] [address] PCP: Pcp, No   Assessment & Plan: Visit Diagnoses:  1. Primary osteoarthritis of left knee     Plan: Impression is left greater than right degenerative joint disease of knees.  She has failed conservative treatments so far and arthroscopic surgery is not provide her with any relief.  Unfortunately her A1c will have to be less than 7.8 before we can safely proceed with a left total knee replacement.  Her most recent A1c was over 13 a month ago.  She has been placed on medications by PCP and she has follow-up for this.  She would like to have the left knee replaced as soon as she is a surgical candidate.  I have given her my card so that she can contact us as soon as her A1c is below 7.8.  We also discussed the details of the surgery including risk benefits rehab recovery.  Follow-Up Instructions: Return if symptoms worsen or fail to improve.   Orders:  Orders Placed This Encounter  Procedures  . XR KNEE 3 VIEW LEFT  . XR KNEE 3 VIEW RIGHT   No orders of the defined types were placed in this encounter.     Procedures: No procedures performed   Clinical Data: No additional findings.   Subjective: Chief Complaint  Patient presents with  . Left Knee - Pain  . Right Knee - Pain    Laurie Andrews is a 50 year old female here for evaluation of bilateral knee pain worse on the left.  She recently moved down to [place] from [facility]  She had a prior left knee arthroscopy with partial medial meniscectomy and chondroplasty back in [place] which did not provide her with significant relief.  Since then she has had several cortisone and Visco injections which only helped temporarily.  She works at the [employer] at [employer] and she is having a  lot of pain in difficulty with her job because of how much standing she has to do.  She has start up stiffness and pain as well as a falling sensation.  She fell on ice 2 months ago.  Currently she takes oxycodone twice a day that is prescribed by PCP.  She has had Voltaren gel in the past as well without much benefit.   Review of Systems  Constitutional: Negative.   HENT: Negative.   Eyes: Negative.   Respiratory: Negative.   Cardiovascular: Negative.   Endocrine: Negative.   Musculoskeletal: Negative.   Neurological: Negative.   Hematological: Negative.   Psychiatric/Behavioral: Negative.   All other systems reviewed and are negative.    Objective: Vital Signs: There were no vitals taken for this visit.  Physical Exam Vitals and nursing note reviewed.  Constitutional:      Appearance: She is well-developed.  HENT:     Head: Normocephalic and atraumatic.  Pulmonary:     Effort: Pulmonary effort is normal.  Abdominal:     Palpations: Abdomen is soft.  Musculoskeletal:     Cervical back: Neck supple.  Skin:    General: Skin is warm.     Capillary Refill: Capillary refill takes less than 2 seconds.  Neurological:     Mental Status: She is alert and oriented to person, place, and time.  Psychiatric:        Behavior: Behavior normal.        Thought Content: Thought content normal.        Judgment: Judgment normal.     Ortho Exam Bilateral knees show no joint effusion.  Moderate to severe pain with range of motion which is mildly restricted.  Collaterals and cruciates are stable.  1+ patellofemoral crepitus. Specialty Comments:  No specialty comments available.  Imaging: XR KNEE 3 VIEW LEFT  Result Date: 05/20/2020 Moderate degenerative changes to the left knee with joint space narrowing of the medial compartment and spurring of the patellofemoral compartment.  XR KNEE 3 VIEW RIGHT  Result Date: 05/20/2020 Mild degenerative changes to the right knee    PMFS  History: Patient Active Problem List   Diagnosis Date Noted  . Influenza vaccine needed 05/08/2020  . Obesity (BMI 30.0-34.9) 05/08/2020  . Moderate persistent asthma without complication 05/08/2020  . Post-traumatic osteoarthritis of both knees 05/08/2020  . Acute pyelonephritis 04/09/2020  . Constipation 04/09/2020  . Opioid dependence (HCC) 04/09/2020  . Type 2 diabetes mellitus with hyperglycemia (HCC) 04/08/2020  . Essential hypertension 04/08/2020   Past Medical History:  Diagnosis Date  . Anxiety   . Asthma   . Diabetes mellitus without complication (HCC)   . Hypertension     Family History  Family history unknown: Yes    Past Surgical History:  Procedure Laterality Date  . BREAST SURGERY    . CHOLECYSTECTOMY    . KNEE SURGERY     Social History   Occupational History  . Not on file  Tobacco Use  . Smoking status: Never Smoker  . Smokeless tobacco: Never Used  Vaping Use  . Vaping Use: Never used  Substance and Sexual Activity  . Alcohol use: Not Currently  . Drug use: Never  . Sexual activity: Not on file

## 2020-05-21 NOTE — Telephone Encounter (Signed)
FMLA was faxed on 05/15/20

## 2020-05-29 ENCOUNTER — Ambulatory Visit: Payer: Medicare (Managed Care) | Admitting: Pharmacist

## 2020-06-18 ENCOUNTER — Telehealth: Payer: Self-pay | Admitting: Internal Medicine

## 2020-06-18 ENCOUNTER — Encounter: Payer: Self-pay | Admitting: Internal Medicine

## 2020-06-18 NOTE — Progress Notes (Signed)
I received labs from [facility] pain clinic that were drawn on 06/03/2020 by Merryl Hacker. Chemistry: Alkaline phosphatase of 138, glucose of 309, potassium level normal, GFR 92, creatinine 0.7 H/H13.3/41.8 Sed rate, ANA negative. A1c 12.2 MND level 17.7 Rheumatoid factor normal Uric acid level normal at 4.6. Results were faxed to [place] on 06/10/2020.  Note on fax sheet indicates that vitamin D3 was prescribed for the patient. Patient has appointment with me next month.  I will try to get her in with the clinical pharmacist before then for follow-up on the diabetes.

## 2020-06-19 NOTE — Telephone Encounter (Signed)
Patient is aware of Dr. Laural Benes notes and stated she did not have any questions. She also has been scheduled to see [facility] May 20th and will bring her log.

## 2020-06-27 ENCOUNTER — Ambulatory Visit: Payer: Medicare (Managed Care) | Admitting: Pharmacist

## 2020-07-10 ENCOUNTER — Other Ambulatory Visit: Payer: Self-pay

## 2020-07-10 ENCOUNTER — Ambulatory Visit: Payer: Medicare (Managed Care) | Attending: Internal Medicine | Admitting: Pharmacist

## 2020-07-10 ENCOUNTER — Encounter (INDEPENDENT_AMBULATORY_CARE_PROVIDER_SITE_OTHER): Payer: Self-pay

## 2020-07-10 ENCOUNTER — Encounter: Payer: Self-pay | Admitting: Pharmacist

## 2020-07-10 DIAGNOSIS — E1142 Type 2 diabetes mellitus with diabetic polyneuropathy: Secondary | ICD-10-CM

## 2020-07-10 MED ORDER — LANTUS SOLOSTAR 100 UNIT/ML ~~LOC~~ SOPN: 16.0000 [IU] | PEN_INJECTOR | Freq: Every day | SUBCUTANEOUS | 11 refills | Status: DC

## 2020-07-10 MED ORDER — INSULIN ASPART 100 UNIT/ML FLEXPEN: PEN_INJECTOR | SUBCUTANEOUS | 11 refills | Status: DC

## 2020-07-10 NOTE — Patient Instructions (Signed)
Thank you for coming to see me today. Please do the following:  1. Increase Lantus to 16 units daily.  2. Increase Humalog to 5 units three times a day. Take before you eat your meals.   3. Continue checking blood sugars at home.  4. Continue making the lifestyle changes we've discussed together during our visit. Diet and exercise play a significant role in improving your blood sugars.  5. Follow-up with me after your visit with Dr. Laural Benes.   Hypoglycemia or low blood sugar:   Low blood sugar can happen quickly and may become an emergency if not treated right away.   While this shouldn't happen often, it can be brought upon if you skip a meal or do not eat enough. Also, if your insulin or other diabetes medications are dosed too high, this can cause your blood sugar to go to low.   Warning signs of low blood sugar include: 1. Feeling shaky or dizzy 2. Feeling weak or tired  3. Excessive hunger 4. Feeling anxious or upset  5. Sweating even when you aren't exercising  What to do if I experience low blood sugar? 1. Check your blood sugar with your meter. If lower than 70, proceed to step 2.  2. Treat with 3-4 glucose tablets or 3 packets of regular sugar. If these aren't around, you can try hard candy. Yet another option would be to drink 4 ounces of fruit juice or 6 ounces of REGULAR soda.  3. Re-check your sugar in 15 minutes. If it is still below 70, do what you did in step 2 again. If has come back up, go ahead and eat a snack or small meal at this time.

## 2020-07-10 NOTE — Progress Notes (Signed)
    S:    PCP: Dr. Laural Benes   No chief complaint on file.  Patient arrives in good spirits.  Presents for diabetes evaluation, education, and management. Patient was referred on 06/18/2020 after A1c results from [facility] revealed an A1c of 12.2%.   Patient reports Diabetes is longstanding. She could not tolerate metformin. She has previously used Trulicity with [place]. Denies hx of pancreatitis. No personal hx of ACS/CAD, CKD, CHF, or stroke. No personal or fhx of thyroid cancer.   Family/Social History:  Fhx: unknown by the patient Tobacco: never smoker  Alcohol: denies use   Insurance coverage/medication affordability: Medicare/Medicaid   Medication adherence reported but appears suboptimal.   Current diabetes medications include: glipizide 20 mg daily (reports only taking 10 mg daily), Lantus 10 units daily (cannot verbalize her dose), Novolog 3 units TID (cannot verbalize her dose)  Patient denies hypoglycemic events.  Patient reported dietary habits:  - Tries to stay away from sweets, carbs   Patient-reported exercise habits: walks at work    Patient reports nocturia (nighttime urination).  Patient denies neuropathy (nerve pain). Patient reports visual changes. Patient reports self foot exams.    O:  Lab Results  Component Value Date   HGBA1C 13.7 (H) 04/08/2020   There were no vitals filed for this visit.  Lipid Panel  No results found for: CHOL, TRIG, HDL, CHOLHDL, VLDL, LDLCALC, LDLDIRECT  Home fasting blood sugars: 180s  2 hour post-meal/random blood sugars: 200-300s.  Clinical Atherosclerotic Cardiovascular Disease (ASCVD): No  The 10-year ASCVD risk score Denman George DC Jr., et al., 2013) is: 17%   Values used to calculate the score:     Age: 50 years     Sex: Female     Is Non-Hispanic African American: Yes     Diabetic: Yes     Tobacco smoker: No     Systolic Blood Pressure: 149 mmHg     Is BP treated: Yes     HDL Cholesterol: 51 mg/dl     Total  Cholesterol: 192 mg/dl   A/P: Diabetes longstanding currently uncontrolled. Patient is able to verbalize appropriate hypoglycemia management plan. Medication adherence appears suboptimal, however, she assures me she is taking her insulin as prescribed. She is hyperglycemic at home and endorses associated symptoms. For this reason, will titrate insulin.  -Increased dose of Lantus to 16 units daily.  -Increased dose of Novolog to 5 units TID BEFORE meals.   -Discontinued glipizide -Extensively discussed pathophysiology of diabetes, recommended lifestyle interventions, dietary effects on blood sugar control -Counseled on s/sx of and management of hypoglycemia -Next A1C anticipated 09/2020.   Written patient instructions provided.  Total time in face to face counseling 30 minutes.   Follow up PCP Clinic Visit on Monday.   Butch Penny, PharmD, P[place]y [place], CPP Clinical Pharmacist [facility] [phone]

## 2020-07-13 ENCOUNTER — Telehealth: Payer: Self-pay

## 2020-07-13 NOTE — Telephone Encounter (Signed)
Called patient x2 and LVM advising patient I was calling from [facility] In regards to her appointment for tomorrow. Apologized for short notice but Dr. Laural Benes will not be in the office to complete her appointments in person tomorrow, but she will be available for a virtual visit. Advised patient Dr. Laural Benes would give her a phone call to complete her visit at her appt time of 9:10am and if she had any questions or concerns to give [place] a call [phone].

## 2020-07-14 ENCOUNTER — Ambulatory Visit: Payer: Medicare (Managed Care) | Attending: Internal Medicine | Admitting: Internal Medicine

## 2020-07-14 ENCOUNTER — Other Ambulatory Visit: Payer: Self-pay

## 2020-07-14 DIAGNOSIS — E1142 Type 2 diabetes mellitus with diabetic polyneuropathy: Secondary | ICD-10-CM | POA: Diagnosis not present

## 2020-07-14 DIAGNOSIS — I10 Essential (primary) hypertension: Secondary | ICD-10-CM | POA: Diagnosis not present

## 2020-07-14 DIAGNOSIS — Z1231 Encounter for screening mammogram for malignant neoplasm of breast: Secondary | ICD-10-CM | POA: Diagnosis not present

## 2020-07-14 MED ORDER — LANTUS SOLOSTAR 100 UNIT/ML ~~LOC~~ SOPN
20.0000 [IU] | PEN_INJECTOR | Freq: Every day | SUBCUTANEOUS | 11 refills | Status: DC
Start: 2020-07-14 — End: 2021-07-24

## 2020-07-14 MED ORDER — INSULIN ASPART 100 UNIT/ML FLEXPEN: PEN_INJECTOR | SUBCUTANEOUS | 11 refills | Status: DC

## 2020-07-14 NOTE — Progress Notes (Signed)
Virtual Visit via Telephone Note  I connected with Laurie Andrews on 07/14/2020 at 9:28 a.m by telephone and verified that I am speaking with the correct person using two identifiers  Location: Patient: home Provider: office  Participants: Myself Patient   I discussed the limitations, risks, security and privacy concerns of performing an evaluation and management service by telephone and the availability of in person appointments. I also discussed with the patient that there may be a patient responsible charge related to this service. The patient expressed understanding and agreed to proceed.   History of Present Illness: Patient with history of DM type II with peripheral neuropathy, HTN, opiate dependence, anxiety disorder, asthma, migraines, OA knees, alopecia.  Last seen 04/2020  DM: We received labs from [facility]. One of which was A1C of 12.2 done end of April. I had pt see our clinical pharmacist last wk.  Glipizide was discontinued.  Lantus was increased to 16 units and NovoLog to 5 units with meals.  Patient reports compliance with the changes. Checking BS TID before meals. Did not check BS at yet this morning.  BS have been in upper 200s despite recent changes in dose of insulins -Walks and stands a lot at work.   -due for eye exam.  A little blurred vision.  Wears glasses  HTN: Had blood pressure monitoring device but misplaced it.  Losartan increased to 100 mg on last visit.  She reports compliance with medication and limit salt in the foods.  Occasional headaches.  HM:  Due for MMG and eye exam.  Reports having had nl colonoscopy done 2020 or 2021 in [place].  She had signed a release on last visit with me for [place] to get her records from previous PCP in [place].  I have not received those records as yet.    Outpatient Encounter Medications as of 07/14/2020  Medication Sig  . albuterol (PROVENTIL) (2.5 MG/3ML) 0.083% nebulizer solution Take 3 mLs (2.5 mg  total) by nebulization every 6 (six) hours as needed for wheezing or shortness of breath.  Marland Kitchen albuterol (VENTOLIN HFA) 108 (90 Base) MCG/ACT inhaler Inhale 1-2 puffs into the lungs daily as needed for wheezing or shortness of breath.  Marland Kitchen amitriptyline (ELAVIL) 100 MG tablet Take 100 mg by mouth as needed for sleep.  . blood glucose meter kit and supplies KIT Dispense based on patient and insurance preference. Use up to four times daily as directed. (FOR ICD-9 250.00, 250.01).  . blood glucose meter kit and supplies KIT Dispense based on patient and insurance preference. Use up to four times daily as directed.  . cetirizine (ZYRTEC) 10 MG tablet Take 10 mg by mouth daily.  . Cholecalciferol 25 MCG (1000 UT) capsule Take 1,000 Units by mouth daily.  . diclofenac Sodium (VOLTAREN) 1 % GEL Apply 2 g topically 4 (four) times daily.  Marland Kitchen GAVILAX 17 GM/SCOOP powder Take 17 g by mouth daily as needed for mild constipation.  . insulin aspart (NOVOLOG) 100 UNIT/ML FlexPen 5 units subsut with meals  . insulin glargine (LANTUS SOLOSTAR) 100 UNIT/ML Solostar Pen Inject 16 Units into the skin at bedtime.  . Insulin Pen Needle (PEN NEEDLES) 31G X 8 MM MISC UAD  . losartan (COZAAR) 100 MG tablet Take 1 tablet (100 mg total) by mouth daily.  . montelukast (SINGULAIR) 10 MG tablet Take 10 mg by mouth daily.  . naratriptan (AMERGE) 2.5 MG tablet Take 2.5 mg by mouth as needed for migraine.  . nystatin cream (  MYCOSTATIN) Apply 1 application topically 3 (three) times daily as needed for dry skin.  Marland Kitchen ondansetron (ZOFRAN ODT) 4 MG disintegrating tablet Take 1 tablet (4 mg total) by mouth every 8 (eight) hours as needed for nausea or vomiting.  Marland Kitchen oxyCODONE (OXY IR/ROXICODONE) 5 MG immediate release tablet Take 5 mg by mouth in the morning, at noon, and at bedtime.  . pantoprazole (PROTONIX) 40 MG tablet Take 40 mg by mouth 2 (two) times daily.  Marland Kitchen senna-docusate (SENOKOT-S) 8.6-50 MG tablet Take 1 tablet by mouth 2 (two)  times daily.  Marland Kitchen topiramate (TOPAMAX) 25 MG tablet Take 50 mg by mouth 2 (two) times daily.  Marland Kitchen triamcinolone (KENALOG) 0.1 % Apply 1 application topically 2 (two) times daily.   No facility-administered encounter medications on file as of 07/14/2020.      Observations/Objective: Results for orders placed or performed in visit on 05/08/20  Microalbumin / creatinine urine ratio  Result Value Ref Range   Creatinine, Urine 38.3 Not Estab. mg/dL   Microalbumin, Urine 9.2 Not Estab. ug/mL   Microalb/Creat Ratio 24 0 - 29 mg/g creat  POCT glucose (manual entry)  Result Value Ref Range   POC Glucose 280 (A) 70 - 99 mg/dl   Lab Results  Component Value Date   HGBA1C 13.7 (H) 04/08/2020   No results found for: CHOL, HDL, LDLCALC, LDLDIRECT, TRIG, CHOLHDL   Assessment and Plan: 1. Type 2 diabetes mellitus with peripheral neuropathy (HCC) Reported blood sugars are not at goal.  I recommend increasing Lantus insulin to 20 units daily and NovoLog to 7 units with meals.  If after 3 days, she is still waking up with morning blood sugars greater than 150, I recommend increasing the Lantus insulin to 24 units. Continue moving as much as she can and trying to eat healthy. - insulin glargine (LANTUS SOLOSTAR) 100 UNIT/ML Solostar Pen; Inject 20 Units into the skin at bedtime.  Dispense: 15 mL; Refill: 11 - insulin aspart (NOVOLOG) 100 UNIT/ML FlexPen; 7 units subsut with meals  Dispense: 15 mL; Refill: 11 - Ambulatory referral to Ophthalmology - Lipid panel; Future  2. Essential hypertension Continue Cozaar.  Advised to check with her insurance to see if they would purchase another blood pressure monitoring device for her so she can check blood pressures at home.  3. Encounter for screening mammogram for malignant neoplasm of breast Patient agreeable for referral for mammogram. - MM Digital Screening; Future   Follow Up Instructions: 3 mths   I discussed the assessment and treatment plan with  the patient. The patient was provided an opportunity to ask questions and all were answered. The patient agreed with the plan and demonstrated an understanding of the instructions.   The patient was advised to call back or seek an in-person evaluation if the symptoms worsen or if the condition fails to improve as anticipated.  I  Spent 13 minutes on this telephone encounter  Karle Plumber, MD

## 2020-10-05 ENCOUNTER — Other Ambulatory Visit: Payer: Self-pay | Admitting: Internal Medicine

## 2020-10-05 DIAGNOSIS — L309 Dermatitis, unspecified: Secondary | ICD-10-CM

## 2020-10-05 DIAGNOSIS — J4540 Moderate persistent asthma, uncomplicated: Secondary | ICD-10-CM

## 2020-10-08 ENCOUNTER — Other Ambulatory Visit: Payer: Self-pay | Admitting: Internal Medicine

## 2020-10-08 NOTE — Telephone Encounter (Signed)
Patient last seen her PCP 05/08/2020..  Medication Refill - Medication: albuterol (VENTOLIN HFA) 108 (90 Base) MCG/ACT inhaler  Has the patient contacted their pharmacy? Yes.    (Agent: If yes, when and what did the pharmacy advise?) Contact PCP office  Preferred Pharmacy (with phone number or street name):   [id number] - [place], [place] - [address]  [address], [place] [address]   Agent: Please be advised that RX refills may take up to 3 business days. We ask that you follow-up with your pharmacy.

## 2020-10-08 NOTE — Telephone Encounter (Signed)
Requested medication (s) are due for refill today: historical medication  Requested medication (s) are on the active medication list: yes  Last refill:  03/05/19  Future visit scheduled: yes in 1 week  Notes to clinic:  historical medication . Do you want to order Rx?     Requested Prescriptions  Pending Prescriptions Disp Refills   albuterol (VENTOLIN HFA) 108 (90 Base) MCG/ACT inhaler      Sig: Inhale 1-2 puffs into the lungs daily as needed for wheezing or shortness of breath.     Pulmonology:  Beta Agonists Failed - 10/08/2020  5:30 PM      Failed - One inhaler should last at least one month. If the patient is requesting refills earlier, contact the patient to check for uncontrolled symptoms.      Passed - Valid encounter within last 12 months    Recent Outpatient Visits           2 months ago Essential hypertension   [facility] Jonah Blue B, MD   3 months ago Type 2 diabetes mellitus with peripheral neuropathy [facility])   [place] [facility] [place], Cornelius Moras, RPH-CPP   5 months ago Type 2 diabetes mellitus with peripheral neuropathy [facility])   [place] [facility] Marcine Matar, MD       Future Appointments             In 1 week Marcine Matar, MD [facility]

## 2020-10-10 MED ORDER — ALBUTEROL SULFATE HFA 108 (90 BASE) MCG/ACT IN AERS: 1.0000 | INHALATION_SPRAY | Freq: Every day | RESPIRATORY_TRACT | 2 refills | Status: DC | PRN

## 2020-10-16 ENCOUNTER — Ambulatory Visit: Payer: Medicare (Managed Care) | Admitting: Internal Medicine

## 2020-12-03 ENCOUNTER — Other Ambulatory Visit: Payer: Self-pay | Admitting: Internal Medicine

## 2020-12-03 DIAGNOSIS — L309 Dermatitis, unspecified: Secondary | ICD-10-CM

## 2020-12-03 DIAGNOSIS — I10 Essential (primary) hypertension: Secondary | ICD-10-CM

## 2020-12-03 NOTE — Telephone Encounter (Signed)
Requested Prescriptions  Pending Prescriptions Disp Refills  . triamcinolone cream (KENALOG) 0.1 % [Pharmacy Med Name: TRIAMCINOLONE 0.1% CREAM] 30 g 0    Sig: APPLY TO AFFECTED AREA TWICE A DAY     Dermatology:  Corticosteroids Passed - 12/03/2020  5:59 AM      Passed - Valid encounter within last 12 months    Recent Outpatient Visits          4 months ago Essential hypertension   [place] [facility] Jonah Blue B, MD   4 months ago Type 2 diabetes mellitus with peripheral neuropathy [facility])   [place] [facility] Drucilla Chalet, RPH-CPP   6 months ago Type 2 diabetes mellitus with peripheral neuropathy [facility])   [place] [facility] Jonah Blue B, MD             . losartan (COZAAR) 100 MG tablet [Pharmacy Med Name: LOSARTAN POTASSIUM 100 MG TAB] 90 tablet 0    Sig: TAKE 1 TABLET BY MOUTH EVERY DAY     Cardiovascular:  Angiotensin Receptor Blockers Failed - 12/03/2020  5:59 AM      Failed - Cr in normal range and within 180 days    Creatinine, Ser  Date Value Ref Range Status  04/09/2020 0.65 0.44 - 1.00 mg/dL Final         Failed - K in normal range and within 180 days    Potassium  Date Value Ref Range Status  04/09/2020 3.9 3.5 - 5.1 mmol/L Final         Failed - Last BP in normal range    BP Readings from Last 1 Encounters:  05/08/20 (!) 149/84         Passed - Patient is not pregnant      Passed - Valid encounter within last 6 months    Recent Outpatient Visits          4 months ago Essential hypertension   [place] [facility] [place], Gavin Pound B, MD   4 months ago Type 2 diabetes mellitus with peripheral neuropathy [facility])   [place] [facility] [place], Cornelius Moras, RPH-CPP   6 months ago Type 2 diabetes mellitus with peripheral neuropathy [facility])   [place] [facility] Marcine Matar, MD

## 2020-12-22 ENCOUNTER — Other Ambulatory Visit: Payer: Self-pay | Admitting: Internal Medicine

## 2020-12-22 NOTE — Telephone Encounter (Signed)
Requested medication (s) are due for refill today - no  Requested medication (s) are on the active medication list -yes  Future visit scheduled -no  Last refill: 10/10/20 18g 2 RF  Notes to clinic: Request RF: should not be out yet- attempted to call patient to review inhaler usage and need- left message to call office  Requested Prescriptions  Pending Prescriptions Disp Refills   albuterol (VENTOLIN HFA) 108 (90 Base) MCG/ACT inhaler [Pharmacy Med Name: ALBUTEROL HFA (PROAIR) INHALER] 8.5 each 2    Sig: Inhale 1-2 puffs into the lungs daily as needed for wheezing or shortness of breath.     Pulmonology:  Beta Agonists Failed - 12/22/2020 12:06 AM      Failed - One inhaler should last at least one month. If the patient is requesting refills earlier, contact the patient to check for uncontrolled symptoms.      Passed - Valid encounter within last 12 months    Recent Outpatient Visits           5 months ago Essential hypertension   [place] [facility] [place], Gavin Pound B, MD   5 months ago Type 2 diabetes mellitus with peripheral neuropathy [facility])   [place] [facility] [place], Cornelius Moras, RPH-CPP   7 months ago Type 2 diabetes mellitus with peripheral neuropathy [facility])   [place] [facility] Marcine Matar, MD                 Requested Prescriptions  Pending Prescriptions Disp Refills   albuterol (VENTOLIN HFA) 108 (90 Base) MCG/ACT inhaler [Pharmacy Med Name: ALBUTEROL HFA (PROAIR) INHALER] 8.5 each 2    Sig: Inhale 1-2 puffs into the lungs daily as needed for wheezing or shortness of breath.     Pulmonology:  Beta Agonists Failed - 12/22/2020 12:06 AM      Failed - One inhaler should last at least one month. If the patient is requesting refills earlier, contact the patient to check for uncontrolled symptoms.      Passed - Valid encounter within last 12 months    Recent Outpatient Visits           5  months ago Essential hypertension   [place] [facility] Jonah Blue B, MD   5 months ago Type 2 diabetes mellitus with peripheral neuropathy [facility])   [place] [facility] [place], Cornelius Moras, RPH-CPP   7 months ago Type 2 diabetes mellitus with peripheral neuropathy [facility])   [place] [facility] Marcine Matar, MD

## 2021-02-01 ENCOUNTER — Other Ambulatory Visit: Payer: Self-pay | Admitting: Internal Medicine

## 2021-02-03 NOTE — Telephone Encounter (Signed)
Requested Prescriptions  Pending Prescriptions Disp Refills   albuterol (VENTOLIN HFA) 108 (90 Base) MCG/ACT inhaler [Pharmacy Med Name: ALBUTEROL HFA (PROAIR) INHALER] 8.5 each 0    Sig: INHALE 1-2 PUFFS INTO THE LUNGS DAILY AS NEEDED FOR WHEEZING OR SHORTNESS OF BREATH.     Pulmonology:  Beta Agonists Failed - 02/01/2021 12:05 AM      Failed - One inhaler should last at least one month. If the patient is requesting refills earlier, contact the patient to check for uncontrolled symptoms.      Passed - Valid encounter within last 12 months    Recent Outpatient Visits          6 months ago Essential hypertension   [place] [facility] Jonah Blue B, MD   6 months ago Type 2 diabetes mellitus with peripheral neuropathy [facility])   [place] [facility] [place], Cornelius Moras, RPH-CPP   9 months ago Type 2 diabetes mellitus with peripheral neuropathy [facility])   [place] [facility] Marcine Matar, MD

## 2021-02-21 ENCOUNTER — Other Ambulatory Visit: Payer: Self-pay | Admitting: Internal Medicine

## 2021-02-21 DIAGNOSIS — L309 Dermatitis, unspecified: Secondary | ICD-10-CM

## 2021-02-21 DIAGNOSIS — I10 Essential (primary) hypertension: Secondary | ICD-10-CM

## 2021-02-21 DIAGNOSIS — J4540 Moderate persistent asthma, uncomplicated: Secondary | ICD-10-CM

## 2021-02-21 NOTE — Telephone Encounter (Signed)
Requested Prescriptions  Pending Prescriptions Disp Refills   losartan (COZAAR) 100 MG tablet [Pharmacy Med Name: LOSARTAN POTASSIUM 100 MG TAB] 90 tablet     Sig: TAKE 1 TABLET BY MOUTH EVERY DAY     Cardiovascular:  Angiotensin Receptor Blockers Failed - 02/21/2021  4:42 PM      Failed - Cr in normal range and within 180 days    Creatinine, Ser  Date Value Ref Range Status  04/09/2020 0.65 0.44 - 1.00 mg/dL Final         Failed - K in normal range and within 180 days    Potassium  Date Value Ref Range Status  04/09/2020 3.9 3.5 - 5.1 mmol/L Final         Failed - Last BP in normal range    BP Readings from Last 1 Encounters:  05/08/20 (!) 149/84         Failed - Valid encounter within last 6 months    Recent Outpatient Visits          7 months ago Essential hypertension   [place], Neoma Laming B, MD   7 months ago Type 2 diabetes mellitus with peripheral neuropathy [facility])   [place], Annie Main L, RPH-CPP   9 months ago Type 2 diabetes mellitus with peripheral neuropathy [facility])   [place], Deborah B, MD             Passed - Patient is not pregnant       triamcinolone cream (KENALOG) 0.1 % [Pharmacy Med Name: TRIAMCINOLONE 0.1% CREAM] 30 g 0    Sig: APPLY TO AFFECTED AREA TWICE A DAY     Dermatology:  Corticosteroids Passed - 02/21/2021  4:42 PM      Passed - Valid encounter within last 12 months    Recent Outpatient Visits          7 months ago Essential hypertension   [place], Neoma Laming B, MD   7 months ago Type 2 diabetes mellitus with peripheral neuropathy [facility])   [place], Annie Main L, RPH-CPP   9 months ago Type 2 diabetes mellitus with peripheral neuropathy ([place])   [place], Neoma Laming B, MD               albuterol (PROVENTIL) (2.5 MG/3ML) 0.083% nebulizer solution [Pharmacy Med Name: ALBUTEROL SUL 2.5 MG/3 ML SOLN] 150 mL 0    Sig: INHALE 3 ML BY NEBULIZATION EVERY 6 HOURS AS NEEDED FOR WHEEZING OR SHORTNESS OF BREATH     Pulmonology:  Beta Agonists Failed - 02/21/2021  4:42 PM      Failed - One inhaler should last at least one month. If the patient is requesting refills earlier, contact the patient to check for uncontrolled symptoms.      Passed - Valid encounter within last 12 months    Recent Outpatient Visits          7 months ago Essential hypertension   [place], MD   7 months ago Type 2 diabetes mellitus with peripheral neuropathy [facility])   [place], Jarome [place], RPH-CPP   9 months ago Type 2 diabetes mellitus with peripheral neuropathy [facility])   [place] Ladell Pier, MD

## 2021-02-22 ENCOUNTER — Ambulatory Visit: Payer: Self-pay

## 2021-02-22 NOTE — Telephone Encounter (Signed)
Requested Prescriptions  Pending Prescriptions Disp Refills   losartan (COZAAR) 100 MG tablet [Pharmacy Med Name: LOSARTAN POTASSIUM 100 MG TAB] 90 tablet     Sig: TAKE 1 TABLET BY MOUTH EVERY DAY     Cardiovascular:  Angiotensin Receptor Blockers Failed - 02/22/2021  4:38 PM      Failed - Cr in normal range and within 180 days    Creatinine, Ser  Date Value Ref Range Status  04/09/2020 0.65 0.44 - 1.00 mg/dL Final         Failed - K in normal range and within 180 days    Potassium  Date Value Ref Range Status  04/09/2020 3.9 3.5 - 5.1 mmol/L Final         Failed - Last BP in normal range    BP Readings from Last 1 Encounters:  05/08/20 (!) 149/84         Failed - Valid encounter within last 6 months    Recent Outpatient Visits          7 months ago Essential hypertension   [facility] [place], Gavin Pound B, MD   7 months ago Type 2 diabetes mellitus with peripheral neuropathy [facility])   [place] [facility] [place], Cornelius Moras, RPH-CPP   9 months ago Type 2 diabetes mellitus with peripheral neuropathy [facility])   [place] [facility] Marcine Matar, MD             Passed - Patient is not pregnant       oxyCODONE (OXY IR/ROXICODONE) 5 MG immediate release tablet 30 tablet     Sig: Take 1 tablet (5 mg total) by mouth in the morning, at noon, and at bedtime.     Not Delegated - Analgesics:  Opioid Agonists Failed - 02/22/2021  4:38 PM      Failed - This refill cannot be delegated      Failed - Urine Drug Screen completed in last 360 days      Failed - Valid encounter within last 6 months    Recent Outpatient Visits          7 months ago Essential hypertension   [place] [place], Gavin Pound B, MD   7 months ago Type 2 diabetes mellitus with peripheral neuropathy [facility])   [place] [facility] [place], Jeannett Senior L, RPH-CPP   9 months ago Type 2  diabetes mellitus with peripheral neuropathy [facility])   [place] Marcine Matar, MD             Signed Prescriptions Disp Refills   triamcinolone cream (KENALOG) 0.1 % 30 g 0    Sig: APPLY TO AFFECTED AREA TWICE A DAY     Dermatology:  Corticosteroids Passed - 02/21/2021  4:42 PM      Passed - Valid encounter within last 12 months    Recent Outpatient Visits          7 months ago Essential hypertension   [place] [facility] Jonah Blue B, MD   7 months ago Type 2 diabetes mellitus with peripheral neuropathy [facility])   [place] [facility] [place], Jeannett Senior L, RPH-CPP   9 months ago Type 2 diabetes mellitus with peripheral neuropathy [facility])   [place] [facility] Marcine Matar, MD  albuterol (PROVENTIL) (2.5 MG/3ML) 0.083% nebulizer solution 150 mL 0    Sig: INHALE 3 ML BY NEBULIZATION EVERY 6 HOURS AS NEEDED FOR WHEEZING OR SHORTNESS OF BREATH     Pulmonology:  Beta Agonists Failed - 02/21/2021  4:42 PM      Failed - One inhaler should last at least one month. If the patient is requesting refills earlier, contact the patient to check for uncontrolled symptoms.      Passed - Valid encounter within last 12 months    Recent Outpatient Visits          7 months ago Essential hypertension   [place] [facility] Jonah Blue B, MD   7 months ago Type 2 diabetes mellitus with peripheral [facility] [place])   [place] [facility] [place], Cornelius Moras, RPH-CPP   9 months ago Type 2 diabetes mellitus with peripheral neuropathy [facility])    [facility] Marcine Matar, MD

## 2021-02-22 NOTE — Telephone Encounter (Addendum)
Requested medication (s) are due for refill today: yes  Requested medication (s) are on the active medication list: yes-   Oxycodone (hx provider)  Last refill:  oxycodone: 02/21/20                              losartan: 12/03/20 #90  Future visit scheduled: no- attempted to make appt but no availability until March.   Notes to clinic:  Called pt to make appt - the next appt available is in March -advised pt will put on wait list to discuss knee pain and to discuss getting a new pain doctor  Requested Prescriptions  Pending Prescriptions Disp Refills   losartan (COZAAR) 100 MG tablet [Pharmacy Med Name: LOSARTAN POTASSIUM 100 MG TAB] 90 tablet     Sig: TAKE 1 TABLET BY MOUTH EVERY DAY     Cardiovascular:  Angiotensin Receptor Blockers Failed - 02/22/2021  4:38 PM      Failed - Cr in normal range and within 180 days    Creatinine, Ser  Date Value Ref Range Status  04/09/2020 0.65 0.44 - 1.00 mg/dL Final          Failed - K in normal range and within 180 days    Potassium  Date Value Ref Range Status  04/09/2020 3.9 3.5 - 5.1 mmol/L Final          Failed - Last BP in normal range    BP Readings from Last 1 Encounters:  05/08/20 (!) 149/84          Failed - Valid encounter within last 6 months    Recent Outpatient Visits           7 months ago Essential hypertension   [facility] [place], Gavin Pound B, MD   7 months ago Type 2 diabetes mellitus with peripheral neuropathy [facility])   [place] [facility] [place], Cornelius Moras, RPH-CPP   9 months ago Type 2 diabetes mellitus with peripheral neuropathy [facility])   [place] [facility] Marcine Matar, MD              Passed - Patient is not pregnant       oxyCODONE (OXY IR/ROXICODONE) 5 MG immediate release tablet 30 tablet     Sig: Take 1 tablet (5 mg total) by mouth in the morning, at noon, and at bedtime.     Not Delegated - Analgesics:  Opioid  Agonists Failed - 02/22/2021  4:38 PM      Failed - This refill cannot be delegated      Failed - Urine Drug Screen completed in last 360 days      Failed - Valid encounter within last 6 months    Recent Outpatient Visits           7 months ago Essential hypertension   [place] [facility] Jonah Blue B, MD   7 months ago Type 2 diabetes mellitus with peripheral neuropathy [facility])   [place] [facility] [place], Jeannett Senior L, RPH-CPP   9 months ago Type 2 diabetes mellitus with peripheral neuropathy [facility])   [place] [facility] Marcine Matar, MD              Signed Prescriptions Disp Refills   triamcinolone cream (KENALOG) 0.1 % 30 g 0    Sig: APPLY TO AFFECTED  AREA TWICE A DAY     Dermatology:  Corticosteroids Passed - 02/21/2021  4:42 PM      Passed - Valid encounter within last 12 months    Recent Outpatient Visits           7 months ago Essential hypertension   [place] [place], Gavin Pound B, MD   7 months ago Type 2 diabetes mellitus with peripheral neuropathy [facility])   [place] [facility] [place], Jeannett Senior L, RPH-CPP   9 months ago Type 2 diabetes mellitus with peripheral neuropathy (HCC)   [place] [facility] Jonah Blue B, MD               albuterol (PROVENTIL) (2.5 MG/3ML) 0.083% nebulizer solution 150 mL 0    Sig: INHALE 3 ML BY NEBULIZATION EVERY 6 HOURS AS NEEDED FOR WHEEZING OR SHORTNESS OF BREATH     Pulmonology:  Beta Agonists Failed - 02/21/2021  4:42 PM      Failed - One inhaler should last at least one month. If the patient is requesting refills earlier, contact the patient to check for uncontrolled symptoms.      Passed - Valid encounter within last 12 months    Recent Outpatient Visits           7 months ago Essential hypertension   [place] [facility] Jonah Blue B,  MD   7 months ago Type 2 diabetes mellitus with peripheral neuropathy [facility])   [place] [facility] [place], Cornelius Moras, RPH-CPP   9 months ago Type 2 diabetes mellitus with peripheral neuropathy [facility])   [place] [facility] Marcine Matar, MD

## 2021-02-22 NOTE — Telephone Encounter (Addendum)
Requested medication (s) are due for refill today: yes  Requested medication (s) are on the active medication list: yes  Last refill:  12/03/20 #90 Losartan)                              oxycodone: 02/21/20 hx med  Future visit scheduled: no  Notes to clinic:  spoke with pt and pt placed on wait list- Next available appt 3/23 Oxycodone not delegated to NT to RF   Requested Prescriptions  Pending Prescriptions Disp Refills   losartan (COZAAR) 100 MG tablet [Pharmacy Med Name: LOSARTAN POTASSIUM 100 MG TAB] 90 tablet     Sig: TAKE 1 TABLET BY MOUTH EVERY DAY     Cardiovascular:  Angiotensin Receptor Blockers Failed - 02/22/2021  4:38 PM      Failed - Cr in normal range and within 180 days    Creatinine, Ser  Date Value Ref Range Status  04/09/2020 0.65 0.44 - 1.00 mg/dL Final          Failed - K in normal range and within 180 days    Potassium  Date Value Ref Range Status  04/09/2020 3.9 3.5 - 5.1 mmol/L Final          Failed - Last BP in normal range    BP Readings from Last 1 Encounters:  05/08/20 (!) 149/84          Failed - Valid encounter within last 6 months    Recent Outpatient Visits           7 months ago Essential hypertension   [place] [place], Gavin Pound B, MD   7 months ago Type 2 diabetes mellitus with peripheral neuropathy [facility])   [place] [facility] [place], Cornelius Moras, RPH-CPP   9 months ago Type 2 diabetes mellitus with peripheral neuropathy [facility])   [place] [facility] Marcine Matar, MD              Passed - Patient is not pregnant       oxyCODONE (OXY IR/ROXICODONE) 5 MG immediate release tablet 30 tablet     Sig: Take 1 tablet (5 mg total) by mouth in the morning, at noon, and at bedtime.     Not Delegated - Analgesics:  Opioid Agonists Failed - 02/22/2021  4:38 PM      Failed - This refill cannot be delegated      Failed - Urine Drug Screen completed in  last 360 days      Failed - Valid encounter within last 6 months    Recent Outpatient Visits           7 months ago Essential hypertension   [facility] Jonah Blue B, MD   7 months ago Type 2 diabetes mellitus with peripheral neuropathy [facility])    [facility] [place], Jeannett Senior L, RPH-CPP   9 months ago Type 2 diabetes mellitus with peripheral neuropathy [facility])    [facility] Marcine Matar, MD              Signed Prescriptions Disp Refills   triamcinolone cream (KENALOG) 0.1 % 30 g 0    Sig: APPLY TO AFFECTED AREA TWICE A DAY     Dermatology:  Corticosteroids Passed - 02/21/2021  4:42 PM      Passed -  Valid encounter within last 12 months    Recent Outpatient Visits           7 months ago Essential hypertension   [place] [facility] [place], Gavin Pound B, MD   7 months ago Type 2 diabetes mellitus with peripheral neuropathy [facility])   [place] [facility] Well[facility] [place], Jeannett Senior L, RPH-CPP   9 months ago Type 2 diabetes mellitus with peripheral neuropathy (HCC)   [place] [facility] Jonah Blue B, MD               albuterol (PROVENTIL) (2.5 MG/3ML) 0.083% nebulizer solution 150 mL 0    Sig: INHALE 3 ML BY NEBULIZATION EVERY 6 HOURS AS NEEDED FOR WHEEZING OR SHORTNESS OF BREATH     Pulmonology:  Beta Agonists Failed - 02/21/2021  4:42 PM      Failed - One inhaler should last at least one month. If the patient is requesting refills earlier, contact the patient to check for uncontrolled symptoms.      Passed - Valid encounter within last 12 months    Recent Outpatient Visits           7 months ago Essential hypertension   [place] [facility] Jonah Blue B, MD   7 months ago Type 2 diabetes mellitus with peripheral neuropathy [facility])   [place] [facility] [place], Cornelius Moras, RPH-CPP   9 months ago Type 2 diabetes mellitus with peripheral neuropathy [facility])   [place] [facility] Marcine Matar, MD

## 2021-02-22 NOTE — Telephone Encounter (Signed)
°  Chief Complaint: left?knee pain, fall Symptoms: head swelling to right side of head near the eye, nausea, severe headache, knee pain  Frequency: constant Pertinent Negatives: Patient denies dizziness,,  Disposition: [x] ED /[] Urgent Care (no appt availability in office) / [] Appointment(In office/virtual)/ []  [place] [facility]/ [] Home Care/ [] Refused Recommended Disposition /[] [place] [facility]/ []  Follow-up with PCP Additional Notes: Unsure if pt will go.      Reason for Disposition  [1] SEVERE headache AND [2] not improved 2 hours after pain medicine/ice packs  Answer Assessment - Initial Assessment Questions 1. MECHANISM: "How did the injury happen?" For falls, ask: "What height did you fall from?" and "What surface did you fall against?"      Knee gave out  2. ONSET: "When did the injury happen?" (Minutes or hours ago)      *No Answer* 3. NEUROLOGIC SYMPTOMS: "Was there any loss of consciousness?" "Are there any other neurological symptoms?"      "Massive" headache 4. MENTAL STATUS: "Does the person know who they are, who you are, and where they are?"      yes 5. LOCATION: "What part of the head was hit?"      Right side 6. SCALP APPEARANCE: "What does the scalp look like? Is it bleeding now?" If Yes, ask: "Is it difficult to stop?"      lump 7. SIZE: For cuts, bruises, or swelling, ask: "How large is it?" (e.g., inches or centimeters)       8. PAIN: "Is there any pain?" If Yes, ask: "How bad is it?"  (e.g., Scale 1-10; or mild, moderate, severe)     Severe headache 9. TETANUS: For any breaks in the skin, ask: "When was the last tetanus booster?"     *No Answer* 10. OTHER SYMPTOMS: "Do you have any other symptoms?" (e.g., neck pain, vomiting)       Nausea knee pain 11. PREGNANCY: "Is there any chance you are pregnant?" "When was your last menstrual period?"       *No Answer*  Protocols used: Head Injury-A-AH

## 2021-03-12 ENCOUNTER — Other Ambulatory Visit: Payer: Self-pay | Admitting: Internal Medicine

## 2021-03-12 NOTE — Telephone Encounter (Signed)
Requested medication (s) are due for refill today: yes  Requested medication (s) are on the active medication list: yes  Last refill:  02/03/21 8.5g NO RF  Future visit scheduled: NO SHOW 10/16/20  Notes to clinic:  no show, please assess.  Requested Prescriptions  Pending Prescriptions Disp Refills   albuterol (VENTOLIN HFA) 108 (90 Base) MCG/ACT inhaler [Pharmacy Med Name: ALBUTEROL HFA (PROAIR) INHALER] 8.5 each 0    Sig: INHALE 1-2 PUFFS INTO THE LUNGS DAILY AS NEEDED FOR WHEEZING OR SHORTNESS OF BREATH.     Pulmonology:  Beta Agonists 2 Failed - 03/12/2021 12:45 AM      Failed - Last BP in normal range    BP Readings from Last 1 Encounters:  05/08/20 (!) 149/84          Passed - Last Heart Rate in normal range    Pulse Readings from Last 1 Encounters:  05/08/20 81          Passed - Valid encounter within last 12 months    Recent Outpatient Visits           8 months ago Essential hypertension   [place] [facility] [place], Gavin Pound B, MD   8 months ago Type 2 diabetes mellitus with peripheral neuropathy [facility])   [place] [facility] [place], Jeannett Senior L, RPH-CPP   10 months ago Type 2 diabetes mellitus with peripheral neuropathy [facility])   [place] [facility] And Wellness Marcine Matar, MD

## 2021-04-01 ENCOUNTER — Ambulatory Visit (INDEPENDENT_AMBULATORY_CARE_PROVIDER_SITE_OTHER): Payer: Medicare Other

## 2021-04-01 ENCOUNTER — Other Ambulatory Visit: Payer: Self-pay

## 2021-04-01 ENCOUNTER — Encounter (HOSPITAL_COMMUNITY): Payer: Self-pay | Admitting: Emergency Medicine

## 2021-04-01 ENCOUNTER — Ambulatory Visit (HOSPITAL_COMMUNITY)
Admission: EM | Admit: 2021-04-01 | Discharge: 2021-04-01 | Disposition: A | Discharge status: Home / Self Care | Payer: Medicare Other | Attending: Family Medicine | Admitting: Family Medicine

## 2021-04-01 DIAGNOSIS — M25562 Pain in left knee: Secondary | ICD-10-CM

## 2021-04-01 DIAGNOSIS — M545 Low back pain, unspecified: Secondary | ICD-10-CM

## 2021-04-01 DIAGNOSIS — M1712 Unilateral primary osteoarthritis, left knee: Secondary | ICD-10-CM | POA: Diagnosis not present

## 2021-04-01 DIAGNOSIS — M5136 Other intervertebral disc degeneration, lumbar region: Secondary | ICD-10-CM | POA: Diagnosis not present

## 2021-04-01 DIAGNOSIS — W19XXXA Unspecified fall, initial encounter: Secondary | ICD-10-CM

## 2021-04-01 DIAGNOSIS — G8929 Other chronic pain: Secondary | ICD-10-CM

## 2021-04-01 MED ORDER — TIZANIDINE HCL 4 MG PO TABS: 4.0000 mg | ORAL_TABLET | Freq: Three times a day (TID) | ORAL | 0 refills | Status: DC | PRN

## 2021-04-01 NOTE — ED Triage Notes (Signed)
Pt reports left knee pain for awhile. Pt states she has been out of pain medication for 4 months.  Pt reports lower back pain x 1 week. Pt states she was walking the dog and the dog pulled to hard almost causing her to fall.

## 2021-04-01 NOTE — Discharge Instructions (Addendum)
Your x-rays show some chronic arthritis changes in your knee and on your back.  There is no fracture noted.  I have sent tizanidine 4 mg to the pharmacy; this is a muscle relaxer, that hopefully will at least help your back.  It can make you sleepy

## 2021-04-01 NOTE — ED Provider Notes (Signed)
[facility]    CSN: [id number] Arrival date & time: 04/01/21  1641      History   Chief Complaint Chief Complaint  Patient presents with   Knee Pain   Back Pain    HPI Laurie Andrews is a 51 y.o. female.    Knee Pain Associated symptoms: back pain   Back Pain Here for low back pain that has worsened in the last week.  Also she has had left knee pain that has worsened in the last week she states these pains have been chronic and ongoing, and then about 1 week ago her pitbull pulled too hard on her when she was walking her and she twisted her knee and fell.  Since then she has had popping in that left knee.  No new neurologic symptoms  She has seen pain management here in [place], but she has not seen that provider since September due to insurance problems.  I can see on PMP that she last filled oxycodone in September 2022.  She also has diabetes, which she states is fairly well controlled lately  Past Medical History:  Diagnosis Date   Anxiety    Asthma    Diabetes mellitus without complication ([place])    Hypertension     Patient Active Problem List   Diagnosis Date Noted   Influenza vaccine needed 05/08/2020   Obesity (BMI 30.0-34.9) 05/08/2020   Moderate persistent asthma without complication 72/10/4707   Post-traumatic osteoarthritis of both knees 05/08/2020   Acute pyelonephritis 04/09/2020   Constipation 04/09/2020   Opioid dependence ([place]) 04/09/2020   Type 2 diabetes mellitus with hyperglycemia ([place]) 04/08/2020   Essential hypertension 04/08/2020    Past Surgical History:  Procedure Laterality Date   BREAST SURGERY     CHOLECYSTECTOMY     KNEE SURGERY      OB History   No obstetric history on file.      Home Medications    Prior to Admission medications   Medication Sig Start Date End Date Taking? Authorizing Provider  tiZANidine (ZANAFLEX) 4 MG tablet Take 1 tablet (4 mg total) by mouth every 8 (eight) hours as needed for muscle  spasms. 04/01/21  Yes Barrett Henle, MD  albuterol (PROVENTIL) (2.5 MG/3ML) 0.083% nebulizer solution INHALE 3 ML BY NEBULIZATION EVERY 6 HOURS AS NEEDED FOR WHEEZING OR SHORTNESS OF BREATH 02/21/21   Ladell Pier, MD  albuterol (VENTOLIN HFA) 108 (90 Base) MCG/ACT inhaler INHALE 1-2 PUFFS INTO THE LUNGS DAILY AS NEEDED FOR WHEEZING OR SHORTNESS OF BREATH. 02/03/21   Ladell Pier, MD  amitriptyline (ELAVIL) 100 MG tablet Take 100 mg by mouth as needed for sleep. 12/29/18   [provider]  blood glucose meter kit and supplies KIT Dispense based on patient and insurance preference. Use up to four times daily as directed. (FOR ICD-9 250.00, 250.01). 09/07/19   Darr, Edison Nasuti, PA-C  blood glucose meter kit and supplies KIT Dispense based on patient and insurance preference. Use up to four times daily as directed. 04/09/20   Dwyane Dee, MD  cetirizine (ZYRTEC) 10 MG tablet Take 10 mg by mouth daily. 06/16/11   [provider]  Cholecalciferol 25 MCG (1000 UT) capsule Take 1,000 Units by mouth daily. 04/16/13   [provider]  diclofenac Sodium (VOLTAREN) 1 % GEL Apply 2 g topically 4 (four) times daily. 04/05/20   Corena Herter, PA-C  GAVILAX 17 GM/SCOOP powder Take 17 g by mouth daily as needed for mild  constipation. 09/04/19   [provider]  insulin aspart (NOVOLOG) 100 UNIT/ML FlexPen 7 units subsut with meals 07/14/20   Ladell Pier, MD  insulin glargine (LANTUS SOLOSTAR) 100 UNIT/ML Solostar Pen Inject 20 Units into the skin at bedtime. 07/14/20   Ladell Pier, MD  Insulin Pen Needle (PEN NEEDLES) 31G X 8 MM MISC UAD 05/08/20   Ladell Pier, MD  losartan (COZAAR) 100 MG tablet TAKE 1 TABLET BY MOUTH EVERY DAY 12/03/20   Ladell Pier, MD  montelukast (SINGULAIR) 10 MG tablet Take 10 mg by mouth daily. 08/29/19   [provider]  naloxone [facility]) nasal spray 4 mg/0.1 mL SMARTSIG:Both Nares 07/10/20   [provider]   naratriptan (AMERGE) 2.5 MG tablet Take 2.5 mg by mouth as needed for migraine. 03/05/19   [provider]  nystatin cream (MYCOSTATIN) Apply 1 application topically 3 (three) times daily as needed for dry skin. 01/05/20   [provider]  ondansetron (ZOFRAN ODT) 4 MG disintegrating tablet Take 1 tablet (4 mg total) by mouth every 8 (eight) hours as needed for nausea or vomiting. 04/09/20   Dwyane Dee, MD  oxyCODONE (OXY IR/ROXICODONE) 5 MG immediate release tablet Take 5 mg by mouth in the morning, at noon, and at bedtime.    [provider]  pantoprazole (PROTONIX) 40 MG tablet Take 40 mg by mouth 2 (two) times daily. 08/29/19   [provider]  senna-docusate (SENOKOT-S) 8.6-50 MG tablet Take 1 tablet by mouth 2 (two) times daily. 04/09/20   Dwyane Dee, MD  topiramate (TOPAMAX) 25 MG tablet Take 50 mg by mouth 2 (two) times daily. 03/20/19   [provider]  triamcinolone cream (KENALOG) 0.1 % APPLY TO AFFECTED AREA TWICE A DAY 02/21/21   Ladell Pier, MD    Family History Family History  Family history unknown: Yes    Social History Social History   Tobacco Use   Smoking status: Never   Smokeless tobacco: Never  Vaping Use   Vaping Use: Never used  Substance Use Topics   Alcohol use: Not Currently   Drug use: Never     Allergies   Keflex [cephalexin], Metformin and related, Trulicity [dulaglutide], Aspirin, Morphine and related, and Penicillins   Review of Systems Review of Systems  Musculoskeletal:  Positive for back pain.    Physical Exam Triage Vital Signs ED Triage Vitals  Enc Vitals Group     BP 04/01/21 1718 (!) 146/81     Pulse Rate 04/01/21 1718 82     Resp 04/01/21 1718 16     Temp 04/01/21 1718 98.3 F (36.8 C)     Temp Source 04/01/21 1718 Oral     SpO2 04/01/21 1718 96 %     Weight 04/01/21 1716 164 lb 14.5 oz (74.8 kg)     Height 04/01/21 1716 $RemoveBefor'5\' 1"'rDHNLReqFduc$  (1.549 m)     Head Circumference --      Peak  Flow --      Pain Score 04/01/21 1715 10     Pain Loc --      Pain Edu? --      Excl. in [place]? --    No data found.  Updated Vital Signs BP (!) 146/81 (BP Location: Right Arm)    Pulse 82    Temp 98.3 F (36.8 C) (Oral)    Resp 16    Ht $R'5\' 1"'SR$  (1.549 m)    Wt 74.8 kg  SpO2 96%    BMI 31.16 kg/m   Visual Acuity Right Eye Distance:   Left Eye Distance:   Bilateral Distance:    Right Eye Near:   Left Eye Near:    Bilateral Near:     Physical Exam Vitals reviewed.  Constitutional:      General: She is not in acute distress.    Appearance: She is not toxic-appearing.  HENT:     Mouth/Throat:     Mouth: Mucous membranes are moist.  Eyes:     Extraocular Movements: Extraocular movements intact.     Pupils: Pupils are equal, round, and reactive to light.  Cardiovascular:     Rate and Rhythm: Normal rate and regular rhythm.     Heart sounds: No murmur heard. Pulmonary:     Effort: Pulmonary effort is normal.     Breath sounds: No stridor. No wheezing, rhonchi or rales.  Musculoskeletal:        General: Tenderness (left knee is very tender) present.     Cervical back: Neck supple. No tenderness.  Lymphadenopathy:     Cervical: No cervical adenopathy.  Skin:    Capillary Refill: Capillary refill takes less than 2 seconds.     Coloration: Skin is not jaundiced or pale.  Neurological:     General: No focal deficit present.     Mental Status: She is alert and oriented to person, place, and time.  Psychiatric:        Behavior: Behavior normal.     UC Treatments / Results  Labs (all labs ordered are listed, but only abnormal results are displayed) Labs Reviewed - No data to display  EKG   Radiology DG Lumbar Spine Complete  Result Date: 04/01/2021 CLINICAL DATA:  Left-sided knee pain history of fall EXAM: LUMBAR SPINE - COMPLETE 4+ VIEW COMPARISON:  None. FINDINGS: Metallic artifacts over the upper abdomen and pelvis. 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 Lumbar  alignment within normal limits. Vertebral body heights are maintained. Moderate disc space narrowing and degenerative change at L5-S1 with mild disc space narrowing at L4-L5. Facet degenerative changes of the lower lumbar spine. IMPRESSION: Degenerative changes.  No acute osseous abnormality Electronically Signed   By: Donavan Foil M.D.   On: 04/01/2021 18:03   DG Knee AP/LAT W/[place] Left  Result Date: 04/01/2021 CLINICAL DATA:  Knee pain EXAM: LEFT KNEE 3 VIEWS COMPARISON:  05/20/2020 FINDINGS: No fracture or malalignment. Mild medial and patellofemoral degenerative change. No sizable effusion IMPRESSION: Mild degenerative changes Electronically Signed   By: Donavan Foil M.D.   On: 04/01/2021 18:04    Procedures Procedures (including critical care time)  Medications Ordered in UC Medications - No data to display  Initial Impression / Assessment and Plan / UC Course  I have reviewed the triage vital signs and the nursing notes.  Pertinent labs & imaging results that were available during my care of the patient were reviewed by me and considered in my medical decision making (see chart for details).    X-rays show arthritis and degenerative changes of the knee and of the back.  No fracture or acute bony abnormality noted.  Discussed with the patient that we do not prescribe narcotics for the most part from the urgent care.  She is allergic to aspirin, therefore I am hesitant to try any anti-inflammatories or Toradol for her  Final Clinical Impressions(s) / UC Diagnoses   Final diagnoses:  Acute midline low back pain without sciatica  Chronic  pain of left knee     Discharge Instructions      Your x-rays show some chronic arthritis changes in your knee and on your back.  There is no fracture noted.  I have sent tizanidine 4 mg to the pharmacy; this is a muscle relaxer, that hopefully will at least help your back.  It can make you sleepy       ED Prescriptions      Medication Sig Dispense Auth. Provider   tiZANidine (ZANAFLEX) 4 MG tablet Take 1 tablet (4 mg total) by mouth every 8 (eight) hours as needed for muscle spasms. 30 tablet Zakyia Gagan, Gwenlyn Perking, MD      I have reviewed the PDMP during this encounter.   Barrett Henle, MD 04/01/21 [id number]

## 2021-04-08 ENCOUNTER — Other Ambulatory Visit: Payer: Self-pay | Admitting: Internal Medicine

## 2021-04-08 DIAGNOSIS — I10 Essential (primary) hypertension: Secondary | ICD-10-CM

## 2021-04-08 NOTE — Telephone Encounter (Signed)
Requested medications are due for refill today.  yes ? ?Requested medications are on the active medications list.  yes ? ?Last refill. 12/03/2020 #90 0 refills ? ?Future visit scheduled.   no ? ?Notes to clinic.  Failed protocol d/t expired labs. Pt is also due for OV ? ? ? ?Requested Prescriptions  ?Pending Prescriptions Disp Refills  ? losartan (COZAAR) 100 MG tablet [Pharmacy Med Name: LOSARTAN POTASSIUM 100 MG TAB] 90 tablet 0  ?  Sig: TAKE 1 TABLET BY MOUTH EVERY DAY  ?  ? Cardiovascular:  Angiotensin Receptor Blockers Failed - 04/08/2021 11:47 AM  ?  ?  Failed - Cr in normal range and within 180 days  ?  Creatinine, Ser  ?Date Value Ref Range Status  ?04/09/2020 0.65 0.44 - 1.00 mg/dL Final  ?  ?  ?  ?  Failed - K in normal range and within 180 days  ?  Potassium  ?Date Value Ref Range Status  ?04/09/2020 3.9 3.5 - 5.1 mmol/L Final  ?  ?  ?  ?  Failed - Last BP in normal range  ?  BP Readings from Last 1 Encounters:  ?04/01/21 (!) 146/81  ?  ?  ?  ?  Failed - Valid encounter within last 6 months  ?  Recent Outpatient Visits   ? ?      ? 8 months ago Essential hypertension  ? [place] Karle Plumber B, MD  ? 9 months ago Type 2 diabetes mellitus with peripheral neuropathy ([place])  ? [place], RPH-CPP  ? 11 months ago Type 2 diabetes mellitus with peripheral neuropathy ([place])  ? [place] Ladell Pier, MD  ? ?  ?  ? ?  ?  ?  Passed - Patient is not pregnant  ?  ?  ?  ?

## 2021-04-21 ENCOUNTER — Telehealth: Payer: Self-pay

## 2021-04-21 NOTE — Telephone Encounter (Signed)
Patient contacted to schedule mammogram.  ? ?LVM for pt to call back as soon as possible.  ? ? ?RE: Mobile Mammo event located at: ? ?[facility]  ?      ?[address]    ?[place] [address]    ? ?Date: April 7th  ? ?

## 2021-04-26 ENCOUNTER — Other Ambulatory Visit: Payer: Self-pay | Admitting: Internal Medicine

## 2021-04-26 DIAGNOSIS — I10 Essential (primary) hypertension: Secondary | ICD-10-CM

## 2021-04-26 DIAGNOSIS — E1142 Type 2 diabetes mellitus with diabetic polyneuropathy: Secondary | ICD-10-CM

## 2021-04-27 ENCOUNTER — Telehealth: Payer: Self-pay | Admitting: Internal Medicine

## 2021-04-27 DIAGNOSIS — M172 Bilateral post-traumatic osteoarthritis of knee: Secondary | ICD-10-CM

## 2021-04-27 NOTE — Telephone Encounter (Signed)
Patient requesting pain management referral, specifically to [facility]. Will forward to the CMA of pt's PCP. ?

## 2021-04-27 NOTE — Telephone Encounter (Signed)
Copied from CRM [id number]. Topic: Referral - Request for Referral ?>> Apr 27, 2021  3:06 PM Jaquita Rector A wrote: ?Has patient seen PCP for this complaint? Yes.   ?*If NO, is insurance requiring patient see PCP for this issue before PCP can refer them? ?Referral for which specialty: Pain Clinic  ?Preferred provider/office: [facility]  ?Reason for referral: Pain issues ?

## 2021-04-28 NOTE — Telephone Encounter (Signed)
Will forward to pcp

## 2021-04-28 NOTE — Telephone Encounter (Signed)
Requested medication (s) are due for refill today: Yes ? ?Requested medication (s) are on the active medication list:  ? ?Last refill:   ? ?Future visit scheduled: Yes ? ?Notes to clinic:  See pharmacy request. ? ? ? ?Requested Prescriptions  ?Pending Prescriptions Disp Refills  ? HUMALOG KWIKPEN 100 UNIT/ML KwikPen [Pharmacy Med Name: HUMALOG 100 UNIT/ML KWIKPEN]  0  ?  ? Endocrinology:  Diabetes - Insulins Failed - 04/26/2021 12:26 PM  ?  ?  Failed - HBA1C is between 0 and 7.9 and within 180 days  ?  Hgb A1c MFr Bld  ?Date Value Ref Range Status  ?04/08/2020 13.7 (H) 4.8 - 5.6 % Final  ?  Comment:  ?  (NOTE) ?Pre diabetes:          5.7%-6.4% ? ?Diabetes:              >6.4% ? ?Glycemic control for   <7.0% ?adults with diabetes ?  ?  ?  ?  ?  Failed - Valid encounter within last 6 months  ?  Recent Outpatient Visits   ? ?      ? 9 months ago Essential hypertension  ? [place] Karle Plumber B, MD  ? 9 months ago Type 2 diabetes mellitus with peripheral neuropathy ([place])  ? [place], RPH-CPP  ? 11 months ago Type 2 diabetes mellitus with peripheral neuropathy ([place])  ? [place] Ladell Pier, MD  ? ?  ?  ?Future Appointments   ? ?        ? In 2 months Ladell Pier, MD [place]  ? ?  ? ?  ?  ?  ? ?

## 2021-04-28 NOTE — Addendum Note (Signed)
Addended by: Jonah Blue B on: 04/28/2021 01:18 PM ? ? Modules accepted: Orders ? ?

## 2021-05-14 NOTE — Telephone Encounter (Signed)
Final attempt to schedule for mobile mammogram.  ?

## 2021-05-15 ENCOUNTER — Ambulatory Visit: Payer: Self-pay | Admitting: *Deleted

## 2021-05-15 ENCOUNTER — Telehealth: Payer: Self-pay | Admitting: Internal Medicine

## 2021-05-15 NOTE — Telephone Encounter (Signed)
A1C is 13.   Nurse [place] does te wellness visit.   I send it into [facility]. ? ?[place], Joelene Millin  ?Reason for Disposition ? [1] Follow-up call to recent contact AND [2] information only call, no triage required ? ?Answer Assessment - Initial Assessment Questions ?1. REASON FOR CALL or QUESTION: "What is your reason for calling today?" or "How can I best help you?" or "What question do you have that I can help answer?" ?    Rae Roam, NP wanted to report that pt's A1C is 13.   She does the wellness visits and was asked to check the A1C so the result needs to go to Dr. Karle Plumber. ? ?I let Joelene Millin know the office was closed today (Good Friday) but I would leave the message for Dr. Wynetta Emery for Monday. ? ?Protocols used: Information Only Call - No Triage-A-AH ? ?

## 2021-05-18 ENCOUNTER — Telehealth: Payer: Self-pay | Admitting: Internal Medicine

## 2021-05-18 DIAGNOSIS — M172 Bilateral post-traumatic osteoarthritis of knee: Secondary | ICD-10-CM

## 2021-05-18 NOTE — Telephone Encounter (Signed)
Will forward to provider  

## 2021-05-18 NOTE — Telephone Encounter (Signed)
Copied from CRM [id number]. Topic: Referral - Question >> May 15, 2021  3:03 PM P[place]us, Laurie Andrews wrote: Reason for CRM: Pt wanted to know if another referral for pain management could be sent in due to her insurance, please advise.

## 2021-05-19 ENCOUNTER — Encounter (HOSPITAL_COMMUNITY): Payer: Self-pay | Admitting: Emergency Medicine

## 2021-05-19 ENCOUNTER — Ambulatory Visit (HOSPITAL_COMMUNITY)
Admission: EM | Admit: 2021-05-19 | Discharge: 2021-05-19 | Disposition: A | Discharge status: Home / Self Care | Payer: Medicare Other | Attending: Family Medicine | Admitting: Family Medicine

## 2021-05-19 DIAGNOSIS — M25562 Pain in left knee: Secondary | ICD-10-CM | POA: Diagnosis not present

## 2021-05-19 DIAGNOSIS — M545 Low back pain, unspecified: Secondary | ICD-10-CM | POA: Diagnosis not present

## 2021-05-19 DIAGNOSIS — G8929 Other chronic pain: Secondary | ICD-10-CM

## 2021-05-19 MED ORDER — OXYCODONE HCL 5 MG PO TABS: 5.0000 mg | ORAL_TABLET | Freq: Four times a day (QID) | ORAL | 0 refills | Status: AC | PRN

## 2021-05-19 MED ORDER — TIZANIDINE HCL 4 MG PO TABS: 4.0000 mg | ORAL_TABLET | Freq: Three times a day (TID) | ORAL | 0 refills | Status: DC | PRN

## 2021-05-19 NOTE — ED Provider Notes (Signed)
?[facility] ? ? ? ?CSN: [id number] ?Arrival date & time: 05/19/21  1521 ? ? ?  ? ?History   ?Chief Complaint ?Chief Complaint  ?Patient presents with  ? Hip Pain  ? Fall  ? Back Pain  ? ? ?HPI ?Laurie Andrews is a 51 y.o. female.  ? ?Patient presents with lateral lower back pain radiating into the buttocks and hips and left knee pain for 2 days after fall.  Was walking to the bathroom and getting into the tub when she fell . believes that her left knee buckled beneath her, endorses that she has chronic knee pain and has been recommended that she get a knee replacement.  Pain is worsened by walking and long periods of standing.  Range of motion is intact.  Has attempted use of a muscle relaxer which was ineffective.  Has an upcoming appointment with the pain clinic next week for management of her knee.  History of type 2 diabetes last A1c 13.1.  ? ? ? ? ? ?Past Medical History:  ?Diagnosis Date  ? Anxiety   ? Asthma   ? Diabetes mellitus without complication ([place])   ? Hypertension   ? ? ?Patient Active Problem List  ? Diagnosis Date Noted  ? Influenza vaccine needed 05/08/2020  ? Obesity (BMI 30.0-34.9) 05/08/2020  ? Moderate persistent asthma without complication 44/81/8563  ? Post-traumatic osteoarthritis of both knees 05/08/2020  ? Acute pyelonephritis 04/09/2020  ? Constipation 04/09/2020  ? Opioid dependence ([place]) 04/09/2020  ? Type 2 diabetes mellitus with hyperglycemia ([place]) 04/08/2020  ? Essential hypertension 04/08/2020  ? ? ?Past Surgical History:  ?Procedure Laterality Date  ? BREAST SURGERY    ? CHOLECYSTECTOMY    ? KNEE SURGERY    ? ? ?OB History   ?No obstetric history on file. ?  ? ? ? ?Home Medications   ? ?Prior to Admission medications   ?Medication Sig Start Date End Date Taking? Authorizing Provider  ?albuterol (PROVENTIL) (2.5 MG/3ML) 0.083% nebulizer solution INHALE 3 ML BY NEBULIZATION EVERY 6 HOURS AS NEEDED FOR WHEEZING OR SHORTNESS OF BREATH 02/21/21   Ladell Pier, MD   ?albuterol (VENTOLIN HFA) 108 (90 Base) MCG/ACT inhaler INHALE 1-2 PUFFS INTO THE LUNGS DAILY AS NEEDED FOR WHEEZING OR SHORTNESS OF BREATH. 04/28/21   Ladell Pier, MD  ?amitriptyline (ELAVIL) 100 MG tablet Take 100 mg by mouth as needed for sleep. 12/29/18   [provider]  ?blood glucose meter kit and supplies KIT Dispense based on patient and insurance preference. Use up to four times daily as directed. (FOR ICD-9 250.00, 250.01). 09/07/19   Darr, Edison Nasuti, PA-C  ?blood glucose meter kit and supplies KIT Dispense based on patient and insurance preference. Use up to four times daily as directed. 04/09/20   Dwyane Dee, MD  ?cetirizine (ZYRTEC) 10 MG tablet Take 10 mg by mouth daily. 06/16/11   [provider]  ?Cholecalciferol 25 MCG (1000 UT) capsule Take 1,000 Units by mouth daily. 04/16/13   [provider]  ?diclofenac Sodium (VOLTAREN) 1 % GEL Apply 2 g topically 4 (four) times daily. 04/05/20   Corena Herter, PA-C  ?GAVILAX 17 GM/SCOOP powder Take 17 g by mouth daily as needed for mild constipation. 09/04/19   [provider]  ?insulin glargine (LANTUS SOLOSTAR) 100 UNIT/ML Solostar Pen Inject 20 Units into the skin at bedtime. 07/14/20   Ladell Pier, MD  ?insulin lispro (HUMALOG KWIKPEN) 100 UNIT/ML KwikPen Inject 7 Units into the  skin 3 (three) times daily. Before meals. 04/28/21   Ladell Pier, MD  ?Insulin Pen Needle (PEN NEEDLES) 31G X 8 MM MISC UAD 05/08/20   Ladell Pier, MD  ?losartan (COZAAR) 100 MG tablet TAKE 1 TABLET BY MOUTH EVERY DAY 04/28/21   Ladell Pier, MD  ?montelukast (SINGULAIR) 10 MG tablet Take 10 mg by mouth daily. 08/29/19   [provider]  ?naloxone [facility]) nasal spray 4 mg/0.1 mL SMARTSIG:Both Nares 07/10/20   [provider]  ?naratriptan (AMERGE) 2.5 MG tablet Take 2.5 mg by mouth as needed for migraine. 03/05/19   [provider]  ?nystatin cream (MYCOSTATIN) Apply 1 application topically 3  (three) times daily as needed for dry skin. 01/05/20   [provider]  ?ondansetron (ZOFRAN ODT) 4 MG disintegrating tablet Take 1 tablet (4 mg total) by mouth every 8 (eight) hours as needed for nausea or vomiting. 04/09/20   Dwyane Dee, MD  ?oxyCODONE (OXY IR/ROXICODONE) 5 MG immediate release tablet Take 5 mg by mouth in the morning, at noon, and at bedtime.    [provider]  ?pantoprazole (PROTONIX) 40 MG tablet Take 40 mg by mouth 2 (two) times daily. 08/29/19   [provider]  ?senna-docusate (SENOKOT-S) 8.6-50 MG tablet Take 1 tablet by mouth 2 (two) times daily. 04/09/20   Dwyane Dee, MD  ?tiZANidine (ZANAFLEX) 4 MG tablet Take 1 tablet (4 mg total) by mouth every 8 (eight) hours as needed for muscle spasms. 04/01/21   Barrett Henle, MD  ?topiramate (TOPAMAX) 25 MG tablet Take 50 mg by mouth 2 (two) times daily. 03/20/19   [provider]  ?triamcinolone cream (KENALOG) 0.1 % APPLY TO AFFECTED AREA TWICE A DAY 02/21/21   Ladell Pier, MD  ? ? ?Family History ?Family History  ?Family history unknown: Yes  ? ? ?Social History ?Social History  ? ?Tobacco Use  ? Smoking status: Never  ? Smokeless tobacco: Never  ?Vaping Use  ? Vaping Use: Never used  ?Substance Use Topics  ? Alcohol use: Not Currently  ? Drug use: Never  ? ? ? ?Allergies   ?Keflex [cephalexin], Metformin and related, Trulicity [dulaglutide], Aspirin, Morphine and related, and Penicillins ? ? ?Review of Systems ?Review of Systems ?Defer to HPI  ? ? ?Physical Exam ?Triage Vital Signs ?ED Triage Vitals  ?Enc Vitals Group  ?   BP 05/19/21 1714 (!) 141/90  ?   Pulse Rate 05/19/21 1714 82  ?   Resp 05/19/21 1714 18  ?   Temp 05/19/21 1714 98.2 ?F (36.8 ?C)  ?   Temp src --   ?   SpO2 05/19/21 1714 97 %  ?   Weight --   ?   Height --   ?   Head Circumference --   ?   Peak Flow --   ?   Pain Score 05/19/21 1712 10  ?   Pain Loc --   ?   Pain Edu? --   ?   Excl. in [place]? --   ? ?No data found. ? ?Updated  Vital Signs ?BP (!) 141/90   Pulse 82   Temp 98.2 ?F (36.8 ?C)   Resp 18   SpO2 97%  ? ?Visual Acuity ?Right Eye Distance:   ?Left Eye Distance:   ?Bilateral Distance:   ? ?Right Eye Near:   ?Left Eye Near:    ?Bilateral Near:    ? ?Physical Exam ?Constitutional:   ?  Appearance: Normal appearance.  ?HENT:  ?   Head: Normocephalic.  ?Eyes:  ?   Extraocular Movements: Extraocular movements intact.  ?Pulmonary:  ?   Effort: Pulmonary effort is normal.  ?Musculoskeletal:  ?   Comments: Tenderness throughout the midline and bilateral lumbar region into the bilateral buttocks and hips without point tenderness,  no ecchymosis, swelling or deformity noted, range of motion intact, able to bear weight as she walked into the exam room without assistance  ?Skin: ?   General: Skin is warm and dry.  ?Neurological:  ?   Mental Status: She is alert and oriented to person, place, and time. Mental status is at baseline.  ?Psychiatric:     ?   Mood and Affect: Mood normal.     ?   Behavior: Behavior normal.  ? ? ? ?UC Treatments / Results  ?Labs ?(all labs ordered are listed, but only abnormal results are displayed) ?Labs Reviewed - No data to display ? ?EKG ? ? ?Radiology ?No results found. ? ?Procedures ?Procedures (including critical care time) ? ?Medications Ordered in UC ?Medications - No data to display ? ?Initial Impression / Assessment and Plan / UC Course  ?I have reviewed the triage vital signs and the nursing notes. ? ?Pertinent labs & imaging results that were available during my care of the patient were reviewed by me and considered in my medical decision making (see chart for details). ? ?Cute bilateral low back pain without sciatica ?Chronic pain of the left knee ? ?Fall per patient is related to her chronic issues with knee, patient is currently being managed by outpatient physician, will move forward with treatment of pain today is low suspicion of injury to the bones of the back, Zanaflex and oxycodone 5 mg IR  for outpatient use, PDMP reviewed, moderate risk, known opioid dependence however patient has been managed by pain clinic for years up until the last 6 months when her insurance lapsed, has upcoming appointment next

## 2021-05-19 NOTE — Discharge Instructions (Addendum)
Your pain is most likely caused by irritation to the muscles or ligaments.  ? ?May use Zanaflex every 8 hours as needed to relax the muscles ? ?You may use oxycodone every 6 hours as needed for severe pain ? ?You may use heating pad in 15 minute intervals as needed for additional comfort, within the first 2-3 days you may find comfort in using ice in 10-15 minutes over affected area ? ?Begin stretching affected area daily for 10 minutes as tolerated to further loosen muscles  ? ?When lying down place pillow underneath and between knees for support ? ?Can try sleeping without pillow on firm mattress  ? ?Practice good posture: head back, shoulders back, chest forward, pelvis back and weight distributed evenly on both legs ? ?If pain persist after recommended treatment or reoccurs if may be beneficial to follow up with orthopedic specialist for evaluation, this doctor specializes in the bones and can manage your symptoms long-term with options such as but not limited to imaging, medications or physical therapy  ?  ?

## 2021-05-19 NOTE — ED Triage Notes (Signed)
Pt is present today with c/o bilateral hip pain and lower back pain from a fall on Sunday. Pt denies LOC or hitting her head.  ?

## 2021-05-19 NOTE — Telephone Encounter (Signed)
Tried contacting pt to schedule a sooner appt pt didn't answer  ?

## 2021-05-28 DIAGNOSIS — M25561 Pain in right knee: Secondary | ICD-10-CM | POA: Diagnosis not present

## 2021-05-28 DIAGNOSIS — G8929 Other chronic pain: Secondary | ICD-10-CM | POA: Diagnosis not present

## 2021-05-28 DIAGNOSIS — M545 Low back pain, unspecified: Secondary | ICD-10-CM | POA: Diagnosis not present

## 2021-05-28 DIAGNOSIS — M25562 Pain in left knee: Secondary | ICD-10-CM | POA: Diagnosis not present

## 2021-05-28 DIAGNOSIS — M5431 Sciatica, right side: Secondary | ICD-10-CM | POA: Diagnosis not present

## 2021-05-28 DIAGNOSIS — M1710 Unilateral primary osteoarthritis, unspecified knee: Secondary | ICD-10-CM | POA: Diagnosis not present

## 2021-05-28 DIAGNOSIS — Z79891 Long term (current) use of opiate analgesic: Secondary | ICD-10-CM | POA: Diagnosis not present

## 2021-05-28 DIAGNOSIS — G894 Chronic pain syndrome: Secondary | ICD-10-CM | POA: Diagnosis not present

## 2021-06-25 DIAGNOSIS — Z79891 Long term (current) use of opiate analgesic: Secondary | ICD-10-CM | POA: Diagnosis not present

## 2021-06-25 DIAGNOSIS — M1710 Unilateral primary osteoarthritis, unspecified knee: Secondary | ICD-10-CM | POA: Diagnosis not present

## 2021-06-25 DIAGNOSIS — M25561 Pain in right knee: Secondary | ICD-10-CM | POA: Diagnosis not present

## 2021-06-25 DIAGNOSIS — G894 Chronic pain syndrome: Secondary | ICD-10-CM | POA: Diagnosis not present

## 2021-06-25 DIAGNOSIS — M545 Low back pain, unspecified: Secondary | ICD-10-CM | POA: Diagnosis not present

## 2021-06-25 DIAGNOSIS — G8929 Other chronic pain: Secondary | ICD-10-CM | POA: Diagnosis not present

## 2021-06-25 DIAGNOSIS — M5431 Sciatica, right side: Secondary | ICD-10-CM | POA: Diagnosis not present

## 2021-06-25 DIAGNOSIS — M25562 Pain in left knee: Secondary | ICD-10-CM | POA: Diagnosis not present

## 2021-07-17 ENCOUNTER — Other Ambulatory Visit: Payer: Self-pay | Admitting: Internal Medicine

## 2021-07-17 NOTE — Telephone Encounter (Signed)
Pt has an future OV scheduled, will refill medication.  Requested Prescriptions  Pending Prescriptions Disp Refills  . albuterol (VENTOLIN HFA) 108 (90 Base) MCG/ACT inhaler [Pharmacy Med Name: ALBUTEROL HFA (PROAIR) INHALER] 8.5 each 0    Sig: INHALE 1-2 PUFFS INTO THE LUNGS DAILY AS NEEDED FOR WHEEZING OR SHORTNESS OF BREATH.     Pulmonology:  Beta Agonists 2 Failed - 07/17/2021  6:58 AM      Failed - Last BP in normal range    BP Readings from Last 1 Encounters:  05/19/21 (!) 141/90         Failed - Valid encounter within last 12 months    Recent Outpatient Visits          1 year ago Essential hypertension   [facility] Jonah Blue B, MD   1 year ago Type 2 diabetes mellitus with peripheral neuropathy [facility])   [place] [facility] [place], Cornelius Moras, RPH-CPP   1 year ago Type 2 diabetes mellitus with peripheral neuropathy [facility])   [place] [facility] Marcine Matar, MD      Future Appointments            In 1 week Marcine Matar, MD [facility]           Passed - Last Heart Rate in normal range    Pulse Readings from Last 1 Encounters:  05/19/21 82

## 2021-07-19 ENCOUNTER — Other Ambulatory Visit: Payer: Self-pay | Admitting: Internal Medicine

## 2021-07-19 DIAGNOSIS — I10 Essential (primary) hypertension: Secondary | ICD-10-CM

## 2021-07-24 ENCOUNTER — Encounter: Payer: Self-pay | Admitting: Internal Medicine

## 2021-07-24 ENCOUNTER — Ambulatory Visit: Payer: Medicare Other | Attending: Internal Medicine | Admitting: Internal Medicine

## 2021-07-24 VITALS — BP 150/90 | HR 70 | Temp 97.7°F | Wt 171.2 lb

## 2021-07-24 DIAGNOSIS — M1710 Unilateral primary osteoarthritis, unspecified knee: Secondary | ICD-10-CM | POA: Diagnosis not present

## 2021-07-24 DIAGNOSIS — M170 Bilateral primary osteoarthritis of knee: Secondary | ICD-10-CM | POA: Insufficient documentation

## 2021-07-24 DIAGNOSIS — I152 Hypertension secondary to endocrine disorders: Secondary | ICD-10-CM | POA: Diagnosis not present

## 2021-07-24 DIAGNOSIS — Z79891 Long term (current) use of opiate analgesic: Secondary | ICD-10-CM | POA: Diagnosis not present

## 2021-07-24 DIAGNOSIS — E1159 Type 2 diabetes mellitus with other circulatory complications: Secondary | ICD-10-CM | POA: Diagnosis not present

## 2021-07-24 DIAGNOSIS — Z79899 Other long term (current) drug therapy: Secondary | ICD-10-CM | POA: Insufficient documentation

## 2021-07-24 DIAGNOSIS — Z23 Encounter for immunization: Secondary | ICD-10-CM | POA: Diagnosis not present

## 2021-07-24 DIAGNOSIS — E669 Obesity, unspecified: Secondary | ICD-10-CM | POA: Insufficient documentation

## 2021-07-24 DIAGNOSIS — J4540 Moderate persistent asthma, uncomplicated: Secondary | ICD-10-CM | POA: Diagnosis not present

## 2021-07-24 DIAGNOSIS — Z6832 Body mass index (BMI) 32.0-32.9, adult: Secondary | ICD-10-CM | POA: Insufficient documentation

## 2021-07-24 DIAGNOSIS — Z794 Long term (current) use of insulin: Secondary | ICD-10-CM | POA: Insufficient documentation

## 2021-07-24 DIAGNOSIS — Z1231 Encounter for screening mammogram for malignant neoplasm of breast: Secondary | ICD-10-CM

## 2021-07-24 DIAGNOSIS — E1165 Type 2 diabetes mellitus with hyperglycemia: Secondary | ICD-10-CM | POA: Insufficient documentation

## 2021-07-24 DIAGNOSIS — M25562 Pain in left knee: Secondary | ICD-10-CM | POA: Diagnosis not present

## 2021-07-24 DIAGNOSIS — E1142 Type 2 diabetes mellitus with diabetic polyneuropathy: Secondary | ICD-10-CM | POA: Insufficient documentation

## 2021-07-24 DIAGNOSIS — G894 Chronic pain syndrome: Secondary | ICD-10-CM | POA: Diagnosis not present

## 2021-07-24 DIAGNOSIS — F1120 Opioid dependence, uncomplicated: Secondary | ICD-10-CM | POA: Insufficient documentation

## 2021-07-24 DIAGNOSIS — E1169 Type 2 diabetes mellitus with other specified complication: Secondary | ICD-10-CM

## 2021-07-24 DIAGNOSIS — M5431 Sciatica, right side: Secondary | ICD-10-CM | POA: Diagnosis not present

## 2021-07-24 DIAGNOSIS — M25561 Pain in right knee: Secondary | ICD-10-CM | POA: Diagnosis not present

## 2021-07-24 DIAGNOSIS — G8929 Other chronic pain: Secondary | ICD-10-CM | POA: Diagnosis not present

## 2021-07-24 DIAGNOSIS — M545 Low back pain, unspecified: Secondary | ICD-10-CM | POA: Diagnosis not present

## 2021-07-24 LAB — POCT GLYCOSYLATED HEMOGLOBIN (HGB A1C): HbA1c POC (<> result, manual entry): >15 % (ref 4.0–5.6)

## 2021-07-24 LAB — GLUCOSE, POCT (MANUAL RESULT ENTRY): POC Glucose: 366 mg/dl — AB (ref 70–99)

## 2021-07-24 MED ORDER — ACCU-CHEK GUIDE W/DEVICE KIT: PACK | 0 refills | Status: DC

## 2021-07-24 MED ORDER — FLUTICASONE-SALMETEROL 100-50 MCG/ACT IN AEPB: 1.0000 | INHALATION_SPRAY | Freq: Two times a day (BID) | RESPIRATORY_TRACT | 3 refills | Status: DC

## 2021-07-24 MED ORDER — INSULIN LISPRO (1 UNIT DIAL) 100 UNIT/ML (KWIKPEN): 5.0000 [IU] | PEN_INJECTOR | Freq: Three times a day (TID) | SUBCUTANEOUS | 1 refills | Status: DC

## 2021-07-24 MED ORDER — LANTUS SOLOSTAR 100 UNIT/ML ~~LOC~~ SOPN: 15.0000 [IU] | PEN_INJECTOR | Freq: Every day | SUBCUTANEOUS | 11 refills | Status: DC

## 2021-07-24 MED ORDER — ACCU-CHEK SOFTCLIX LANCETS MISC: 12 refills | Status: DC

## 2021-07-24 MED ORDER — ACCU-CHEK GUIDE VI STRP: ORAL_STRIP | 12 refills | Status: DC

## 2021-07-24 MED ORDER — LOSARTAN POTASSIUM 100 MG PO TABS: 100.0000 mg | ORAL_TABLET | Freq: Every day | ORAL | 6 refills | Status: DC

## 2021-07-24 NOTE — Patient Instructions (Addendum)
I will send prescription to your pharmacy for diabetic testing supplies. Please take your insulin as prescribed.  Lantus insulin should be 15 units at bedtime and Humalog insulin 5 units with meals. Check your blood sugars at least twice a day before meals.  Record your readings and bring them in with you to see the clinical pharmacist in 2 weeks.

## 2021-07-24 NOTE — Progress Notes (Signed)
Patient ID: Laurie Andrews, female    DOB: Dec 22, 1970  MRN: [id number]  CC: Chronic disease management.  Subjective: Laurie Andrews is a 51 y.o. female who presents for chronic disease management.  Patient was last seen by me 07/2020. Her concerns today include:  Patient with history of DM type II with peripheral neuropathy, HTN, opiate dependence, anxiety disorder, asthma, migraines, OA knees, alopecia.    HM:  Due for MMG, eye exam, PAP,  Reports having had nl colonoscopy done 2020 or 2021 in [place].  She will sign release today.  Last PAP was 2020.  Had Shingrix 6 mths ago ([facility]), Due for Tdapt.   She does not have medications with her today.  HTN: reports compliance with Cozaar 100 mg.  Did not take med as yet for today, had appt with new pain management provider at [facility] this a.m and has not returned home as yet. No device to check BP Limits salt in foods Little SOB at times.   No CP, LE edema  DM: Results for orders placed or performed in visit on 07/24/21  POCT glucose (manual entry)  Result Value Ref Range   POC Glucose 366 (A) 70 - 99 mg/dl  POCT glycosylated hemoglobin (Hb A1C)  Result Value Ref Range   Hemoglobin A1C     HbA1c POC (<> result, manual entry) >15 4.0 - 5.6 %   HbA1c, POC (prediabetic range)     HbA1c, POC (controlled diabetic range)    Not checking BS.  Kit no longer works Suppose to be on Humalog 7 units with meals and Glargine 20 units daily. Pt had difficulty giving me an idea of what she is taking and how much.  Thinks she does 5 units of NOVOLOG at meals and Lantus 10 units.  However, looking at refill history, it looks as though she is not refill glargine in quite some time. Reports dry mouth and blurred vision Up 6 lbs in last yr Feels she can do better with eating habits  OA Knee:  no longer at [facility] for pain management.  Now at [facility] today and had her first visit with them today.  She tells  me she is being followed by orthopedics and needs knee replacement surgery but was told that she needs to get her A1c close to 7 before surgery can be done.  She works at [employer] as a Conservation officer, nature.  She stands for her 8-hour shifts.  Knees are very bothersome.  She is requesting a letter asking that she be allowed to sit to perform her duties.  Asthma:  uses ProAir 2x/night and once during the day.  Cough sometimes Patient Active Problem List   Diagnosis Date Noted   Influenza vaccine needed 05/08/2020   Obesity (BMI 30.0-34.9) 05/08/2020   Moderate persistent asthma without complication 05/08/2020   Post-traumatic osteoarthritis of both knees 05/08/2020   Acute pyelonephritis 04/09/2020   Constipation 04/09/2020   Opioid dependence (HCC) 04/09/2020   Type 2 diabetes mellitus with hyperglycemia (HCC) 04/08/2020   Essential hypertension 04/08/2020     Current Outpatient Medications on File Prior to Visit  Medication Sig Dispense Refill   albuterol (PROVENTIL) (2.5 MG/3ML) 0.083% nebulizer solution INHALE 3 ML BY NEBULIZATION EVERY 6 HOURS AS NEEDED FOR WHEEZING OR SHORTNESS OF BREATH 150 mL 0   albuterol (VENTOLIN HFA) 108 (90 Base) MCG/ACT inhaler INHALE 1-2 PUFFS INTO THE LUNGS DAILY AS NEEDED FOR WHEEZING OR SHORTNESS OF BREATH. 8.5 each  0   amitriptyline (ELAVIL) 100 MG tablet Take 100 mg by mouth as needed for sleep.     blood glucose meter kit and supplies KIT Dispense based on patient and insurance preference. Use up to four times daily as directed. (FOR ICD-9 250.00, 250.01). 1 each 0   blood glucose meter kit and supplies KIT Dispense based on patient and insurance preference. Use up to four times daily as directed. 1 each 0   cetirizine (ZYRTEC) 10 MG tablet Take 10 mg by mouth daily.     Cholecalciferol 25 MCG (1000 UT) capsule Take 1,000 Units by mouth daily.     diclofenac Sodium (VOLTAREN) 1 % GEL Apply 2 g topically 4 (four) times daily. 50 g 0   GAVILAX 17 GM/SCOOP powder Take 17  g by mouth daily as needed for mild constipation.     Insulin Pen Needle (PEN NEEDLES) 31G X 8 MM MISC UAD 100 each 6   montelukast (SINGULAIR) 10 MG tablet Take 10 mg by mouth daily.     naloxone (NARCAN) nasal spray 4 mg/0.1 mL SMARTSIG:Both Nares     naratriptan (AMERGE) 2.5 MG tablet Take 2.5 mg by mouth as needed for migraine.     nystatin cream (MYCOSTATIN) Apply 1 application topically 3 (three) times daily as needed for dry skin.     ondansetron (ZOFRAN ODT) 4 MG disintegrating tablet Take 1 tablet (4 mg total) by mouth every 8 (eight) hours as needed for nausea or vomiting. 20 tablet 1   oxyCODONE (OXY IR/ROXICODONE) 5 MG immediate release tablet Take 5 mg by mouth in the morning, at noon, and at bedtime.     pantoprazole (PROTONIX) 40 MG tablet Take 40 mg by mouth 2 (two) times daily.     senna-docusate (SENOKOT-S) 8.6-50 MG tablet Take 1 tablet by mouth 2 (two) times daily.     tiZANidine (ZANAFLEX) 4 MG tablet Take 1 tablet (4 mg total) by mouth every 8 (eight) hours as needed for muscle spasms. 30 tablet 0   topiramate (TOPAMAX) 25 MG tablet Take 50 mg by mouth 2 (two) times daily.     triamcinolone cream (KENALOG) 0.1 % APPLY TO AFFECTED AREA TWICE A DAY 30 g 0   No current facility-administered medications on file prior to visit.    Allergies  Allergen Reactions   Keflex [Cephalexin] Nausea And Vomiting   Metformin And Related    Trulicity [Dulaglutide] Nausea And Vomiting   Aspirin Nausea And Vomiting and Rash   Morphine And Related Itching and Rash   Penicillins Nausea And Vomiting and Rash    Social History   Socioeconomic History   Marital status: Single    Spouse name: Not on file   Number of children: Not on file   Years of education: Not on file   Highest education level: Not on file  Occupational History   Not on file  Tobacco Use   Smoking status: Never   Smokeless tobacco: Never  Vaping Use   Vaping Use: Never used  Substance and Sexual Activity    Alcohol use: Not Currently   Drug use: Never   Sexual activity: Not on file  Other Topics Concern   Not on file  Social History Narrative   Not on file   Social Determinants of Health   Financial Resource Strain: Not on file  Food Insecurity: Not on file  Transportation Needs: Not on file  Physical Activity: Not on file  Stress: Not on file  Social Connections: Not on file  Intimate Partner Violence: Not on file    Family History  Family history unknown: Yes    Past Surgical History:  Procedure Laterality Date   BREAST SURGERY     CHOLECYSTECTOMY     KNEE SURGERY      ROS: Review of Systems Negative except as stated above  PHYSICAL EXAM: BP (!) 150/90   Pulse 70   Temp 97.7 F (36.5 C) (Oral)   Wt 171 lb 3.2 oz (77.7 kg)   SpO2 98%   BMI 32.35 kg/m   Wt Readings from Last 3 Encounters:  07/24/21 171 lb 3.2 oz (77.7 kg)  04/01/21 164 lb 14.5 oz (74.8 kg)  05/08/20 165 lb (74.8 kg)    Physical Exam  General appearance - alert, well appearing, middle-age obese African-American female and in no distress Mental status - normal mood, behavior, speech, dress, motor activity, and thought processes Neck - supple, no significant adenopathy Chest - clear to auscultation, no wheezes, rales or rhonchi, symmetric air entry Heart - normal rate, regular rhythm, normal S1, S2, no murmurs, rubs, clicks or gallops Extremities - peripheral pulses normal, no pedal edema, no clubbing or cyanosis Diabetic Foot Exam - Simple   Simple Foot Form Diabetic Foot exam was performed with the following findings: Yes 07/24/2021  5:51 PM  Visual Inspection No deformities, no ulcerations, no other skin breakdown bilaterally: Yes Sensation Testing Intact to touch and monofilament testing bilaterally: Yes Pulse Check Posterior Tibialis and Dorsalis pulse intact bilaterally: Yes Comments Slightly flat-footed.         Latest Ref Rng & Units 04/09/2020    4:28 AM 04/08/2020    6:18 AM  04/07/2020   10:47 PM  CMP  Glucose 70 - 99 mg/dL 191  259  250   BUN 6 - 20 mg/dL $Remove'6  9  11   'oVuXoAp$ Creatinine 0.44 - 1.00 mg/dL 0.65  0.55  0.68   Sodium 135 - 145 mmol/L 136  137  134   Potassium 3.5 - 5.1 mmol/L 3.9  3.8  4.2   Chloride 98 - 111 mmol/L 104  105  99   CO2 22 - 32 mmol/L $RemoveB'20  22  24   'UbBXYrcl$ Calcium 8.9 - 10.3 mg/dL 9.3  8.5  9.3   Total Protein 6.5 - 8.1 g/dL  5.9  7.5   Total Bilirubin 0.3 - 1.2 mg/dL  1.2  1.0   Alkaline Phos 38 - 126 U/L  108  135   AST 15 - 41 U/L  17  24   ALT 0 - 44 U/L  18  22    Lipid Panel  No results found for: "CHOL", "TRIG", "HDL", "CHOLHDL", "VLDL", "LDLCALC", "LDLDIRECT"  CBC    Component Value Date/Time   WBC 6.2 04/09/2020 0428   RBC 5.23 (H) 04/09/2020 0428   HGB 13.4 04/09/2020 0428   HCT 42.8 04/09/2020 0428   PLT 366 04/09/2020 0428   MCV 81.8 04/09/2020 0428   MCH 25.6 (L) 04/09/2020 0428   MCHC 31.3 04/09/2020 0428   RDW 12.1 04/09/2020 0428   LYMPHSABS 2.4 04/09/2020 0428   MONOABS 0.5 04/09/2020 0428   EOSABS 0.1 04/09/2020 0428   BASOSABS 0.0 04/09/2020 0428    ASSESSMENT AND PLAN: 1. Type 2 diabetes mellitus with obesity (HCC) Significantly uncontrolled.  I suspect noncompliance with medications.  Discussed importance of taking the medicines as prescribed.  Refill glargine.  Recommend taking 15 units at bedtime and Humalog  insulin 5 units with meals.  I will send prescription for diabetic testing supplies to her pharmacy.  Recommend checking blood sugars at least twice a day before meals.  I will have her follow-up with the clinical pharmacist in 2 weeks for recheck. Patient advised to eliminate sugary drinks from the diet, cut back on portion sizes especially of white carbohydrates, eat more white lean meat like chicken [place] and seafood instead of beef or pork and incorporate fresh fruits and vegetables into the diet daily. -Patient agreeable to seeing a nutritionist for further dietary counseling. - POCT glucose (manual  entry) - POCT glycosylated hemoglobin (Hb A1C) - CBC - Comprehensive metabolic panel - Lipid panel - Microalbumin / creatinine urine ratio - Ambulatory referral to Ophthalmology - Amb ref to Medical Nutrition Therapy-MNT - insulin glargine (LANTUS SOLOSTAR) 100 UNIT/ML Solostar Pen; Inject 15 Units into the skin at bedtime.  Dispense: 15 mL; Refill: 11 - insulin lispro (HUMALOG KWIKPEN) 100 UNIT/ML KwikPen; Inject 5 Units into the skin 3 (three) times daily. Take with meals  Dispense: 15 mL; Refill: 1 - glucose blood (ACCU-CHEK GUIDE) test strip; Use as instructed  Dispense: 100 each; Refill: 12 - Accu-Chek Softclix Lancets lancets; Use as instructed  Dispense: 100 each; Refill: 12 - Blood Glucose Monitoring Suppl (ACCU-CHEK GUIDE) w/Device KIT; UAD  Dispense: 1 kit; Refill: 0  2. Hypertension associated with diabetes ([place]) Not at goal.  She has not taken medicine as yet for today.  Continue Cozaar 100 mg daily.  3. Moderate persistent asthma without complication Not well controlled based on the frequency at which she is using ProAir inhaler.  I recommend adding combination steroid inhaler.  Looks like her insurance covers Advair.  Advised to use twice a day every day to help decrease airway inflammation and control her asthma better.  Advised to rinse the mouth out with warm water after each use. - fluticasone-salmeterol (ADVAIR) 100-50 MCG/ACT AEPB; Inhale 1 puff into the lungs 2 (two) times daily.  Dispense: 1 each; Refill: 3  4. Primary osteoarthritis of both knees Followed by pain management. We will work on trying to get her A1c at goal so that she can proceed with having knee replacement surgery if she still desires. 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   5. Encounter for screening mammogram for malignant neoplasm of breast - MM Digital Screening; Future  6. Need for shingles vaccine Given second Shingrix shot  today.  I will [facility].     Patient was given the opportunity to ask questions.  Patient verbalized understanding of the plan and was able to repeat key elements of the plan.   This documentation was completed using Radio producer.  Any transcriptional errors are unintentional.  Orders Placed This Encounter  Procedures   MM Digital Screening   Varicella-zoster vaccine IM   CBC   Comprehensive metabolic panel   Lipid panel   Microalbumin / creatinine urine ratio   Ambulatory referral to Ophthalmology   Amb ref to Medical Nutrition Therapy-MNT   POCT glucose (manual entry)   POCT glycosylated hemoglobin (Hb A1C)     Requested Prescriptions   Signed Prescriptions Disp Refills   fluticasone-salmeterol (ADVAIR) 100-50 MCG/ACT AEPB 1 each 3    Sig: Inhale 1 puff into the lungs 2 (two) times daily.   insulin glargine (LANTUS SOLOSTAR) 100  UNIT/ML Solostar Pen 15 mL 11    Sig: Inject 15 Units into the skin at bedtime.   insulin lispro (HUMALOG KWIKPEN) 100 UNIT/ML KwikPen 15 mL 1    Sig: Inject 5 Units into the skin 3 (three) times daily. Take with meals   losartan (COZAAR) 100 MG tablet 30 tablet 6    Sig: Take 1 tablet (100 mg total) by mouth daily.   glucose blood (ACCU-CHEK GUIDE) test strip 100 each 12    Sig: Use as instructed   Accu-Chek Softclix Lancets lancets 100 each 12    Sig: Use as instructed   Blood Glucose Monitoring Suppl (ACCU-CHEK GUIDE) w/Device KIT 1 kit 0    Sig: UAD    Return in about 2 months (around 09/23/2021) for Sign release to get colonoscopy report from previous PCP, Appt with [facility] in 2 wks for BS.  Karle Plumber, MD, FACP

## 2021-07-24 NOTE — Progress Notes (Signed)
Requesting cane Needs note for work.

## 2021-07-25 ENCOUNTER — Other Ambulatory Visit: Payer: Self-pay | Admitting: Internal Medicine

## 2021-07-25 DIAGNOSIS — E785 Hyperlipidemia, unspecified: Secondary | ICD-10-CM | POA: Insufficient documentation

## 2021-07-25 DIAGNOSIS — E7800 Pure hypercholesterolemia, unspecified: Secondary | ICD-10-CM

## 2021-07-25 LAB — CBC
Hematocrit: 42.6 % (ref 34.0–46.6)
Hemoglobin: 13.7 g/dL (ref 11.1–15.9)
MCH: 25.3 pg — ABNORMAL LOW (ref 26.6–33.0)
MCHC: 32.2 g/dL (ref 31.5–35.7)
MCV: 79 fL (ref 79–97)
Platelets: 356 10*3/uL (ref 150–450)
RBC: 5.41 x10E6/uL — ABNORMAL HIGH (ref 3.77–5.28)
RDW: 12 % (ref 11.7–15.4)
WBC: 5 10*3/uL (ref 3.4–10.8)

## 2021-07-25 LAB — MICROALBUMIN / CREATININE URINE RATIO
Creatinine, Urine: 37 mg/dL
Microalb/Creat Ratio: 82 mg/g creat — ABNORMAL HIGH (ref 0–29)
Microalbumin, Urine: 30.2 ug/mL

## 2021-07-25 LAB — LIPID PANEL
Chol/HDL Ratio: 3.8 ratio (ref 0.0–4.4)
Cholesterol, Total: 177 mg/dL (ref 100–199)
HDL: 46 mg/dL (ref >39)
LDL Chol Calc (NIH): 88 mg/dL (ref 0–99)
Triglycerides: 259 mg/dL — ABNORMAL HIGH (ref 0–149)
VLDL Cholesterol Cal: 43 mg/dL — ABNORMAL HIGH (ref 5–40)

## 2021-07-25 LAB — COMPREHENSIVE METABOLIC PANEL
ALT: 26 IU/L (ref 0–32)
AST: 15 IU/L (ref 0–40)
Albumin/Globulin Ratio: 1.3 (ref 1.2–2.2)
Albumin: 4.3 g/dL (ref 3.8–4.9)
Alkaline Phosphatase: 188 IU/L — ABNORMAL HIGH (ref 44–121)
BUN/Creatinine Ratio: 14 (ref 9–23)
BUN: 11 mg/dL (ref 6–24)
Bilirubin Total: 0.6 mg/dL (ref 0.0–1.2)
CO2: 24 mmol/L (ref 20–29)
Calcium: 9.8 mg/dL (ref 8.7–10.2)
Chloride: 97 mmol/L (ref 96–106)
Creatinine, Ser: 0.78 mg/dL (ref 0.57–1.00)
Globulin, Total: 3.4 g/dL (ref 1.5–4.5)
Glucose: 341 mg/dL — ABNORMAL HIGH (ref 70–99)
Potassium: 4.7 mmol/L (ref 3.5–5.2)
Sodium: 134 mmol/L (ref 134–144)
Total Protein: 7.7 g/dL (ref 6.0–8.5)
eGFR: 91 mL/min/{1.73_m2} (ref >59)

## 2021-07-25 MED ORDER — ATORVASTATIN CALCIUM 10 MG PO TABS: 10.0000 mg | ORAL_TABLET | Freq: Every day | ORAL | 3 refills | Status: DC

## 2021-07-25 NOTE — Progress Notes (Signed)
Let patient know that she has a small amount of protein in the urine indicating that diabetes may be affecting the kidneys.  Take the blood pressure medicine called losartan daily as prescribed to help protect the kidneys. Blood cell counts are normal. Mild elevation in one of her liver function tests.  We will observe for now. Cholesterol level is 88 with goal being less than 70.  I recommend starting low-dose of a cholesterol medicine called atorvastatin to help decrease risks of heart attack and strokes.  Prescription sent to her pharmacy.

## 2021-07-27 ENCOUNTER — Telehealth: Payer: Self-pay | Admitting: *Deleted

## 2021-07-28 ENCOUNTER — Other Ambulatory Visit: Payer: Self-pay | Admitting: *Deleted

## 2021-07-28 NOTE — Telephone Encounter (Signed)
Insurance does not cover lancets

## 2021-07-28 NOTE — Telephone Encounter (Signed)
Error

## 2021-07-29 NOTE — Telephone Encounter (Signed)
Can you please send lancets covered by his insurance to the pharmacy?  Thank you.

## 2021-07-29 NOTE — Telephone Encounter (Signed)
Yes ma'am, call placed to pharmacy. She picked up her meter, test strips, and lancets earlier today for a $0 copay.

## 2021-08-04 ENCOUNTER — Other Ambulatory Visit: Payer: Self-pay | Admitting: Pharmacist

## 2021-08-04 MED ORDER — ATORVASTATIN CALCIUM 10 MG PO TABS: 10.0000 mg | ORAL_TABLET | Freq: Every day | ORAL | 1 refills | Status: DC

## 2021-08-07 ENCOUNTER — Ambulatory Visit
Admission: RE | Admit: 2021-08-07 | Discharge: 2021-08-07 | Disposition: A | Discharge status: Home / Self Care | Payer: Medicare Other | Source: Ambulatory Visit | Attending: Internal Medicine | Admitting: Internal Medicine

## 2021-08-07 ENCOUNTER — Ambulatory Visit: Payer: Medicaid Other

## 2021-08-07 DIAGNOSIS — Z1231 Encounter for screening mammogram for malignant neoplasm of breast: Secondary | ICD-10-CM | POA: Diagnosis not present

## 2021-08-12 ENCOUNTER — Telehealth: Payer: Self-pay | Admitting: Internal Medicine

## 2021-08-12 NOTE — Telephone Encounter (Signed)
PC placed to pt to go over recent  lab results since she did not call back based on message left by CMA.  Pt was informed the following: Let patient know that she has a small amount of protein in the urine indicating that diabetes may be affecting the kidneys.  Take the blood pressure medicine called losartan daily as prescribed to help protect the kidneys. Blood cell counts are normal. Mild elevation in one of her liver function tests.  We will observe for now. Cholesterol level is 88 with goal being less than 70.  I recommend starting low-dose of a cholesterol medicine called atorvastatin to help decrease risks of heart attack and strokes.  Prescription sent to her pharmacy.  Pt told me that she already picked up the Atorvastatin and has started taking it.  No questions for me.

## 2021-08-21 ENCOUNTER — Other Ambulatory Visit: Payer: Self-pay | Admitting: Internal Medicine

## 2021-08-21 ENCOUNTER — Telehealth: Payer: Self-pay | Admitting: Internal Medicine

## 2021-08-21 DIAGNOSIS — G8929 Other chronic pain: Secondary | ICD-10-CM | POA: Diagnosis not present

## 2021-08-21 DIAGNOSIS — M1712 Unilateral primary osteoarthritis, left knee: Secondary | ICD-10-CM | POA: Diagnosis not present

## 2021-08-21 DIAGNOSIS — G894 Chronic pain syndrome: Secondary | ICD-10-CM | POA: Diagnosis not present

## 2021-08-21 DIAGNOSIS — Z79891 Long term (current) use of opiate analgesic: Secondary | ICD-10-CM | POA: Diagnosis not present

## 2021-08-21 DIAGNOSIS — M1710 Unilateral primary osteoarthritis, unspecified knee: Secondary | ICD-10-CM | POA: Diagnosis not present

## 2021-08-21 DIAGNOSIS — M5431 Sciatica, right side: Secondary | ICD-10-CM | POA: Diagnosis not present

## 2021-08-21 DIAGNOSIS — M25562 Pain in left knee: Secondary | ICD-10-CM | POA: Diagnosis not present

## 2021-08-21 DIAGNOSIS — M545 Low back pain, unspecified: Secondary | ICD-10-CM | POA: Diagnosis not present

## 2021-08-21 DIAGNOSIS — R600 Localized edema: Secondary | ICD-10-CM

## 2021-08-21 DIAGNOSIS — J4540 Moderate persistent asthma, uncomplicated: Secondary | ICD-10-CM

## 2021-08-21 DIAGNOSIS — M25561 Pain in right knee: Secondary | ICD-10-CM | POA: Diagnosis not present

## 2021-08-21 MED ORDER — ALBUTEROL SULFATE (2.5 MG/3ML) 0.083% IN NEBU: INHALATION_SOLUTION | RESPIRATORY_TRACT | 0 refills | Status: DC

## 2021-08-21 NOTE — Telephone Encounter (Signed)
Requested Prescriptions  Pending Prescriptions Disp Refills  . albuterol (PROVENTIL) (2.5 MG/3ML) 0.083% nebulizer solution 150 mL 0    Sig: INHALE 3 ML BY NEBULIZATION EVERY 6 HOURS AS NEEDED FOR WHEEZING OR SHORTNESS OF BREATH     Pulmonology:  Beta Agonists 2 Failed - 08/21/2021 12:41 PM      Failed - Last BP in normal range    BP Readings from Last 1 Encounters:  07/24/21 (!) 150/90         Passed - Last Heart Rate in normal range    Pulse Readings from Last 1 Encounters:  07/24/21 70         Passed - Valid encounter within last 12 months    Recent Outpatient Visits          4 weeks ago Type 2 diabetes mellitus with obesity (HCC)   [place] Marcine Matar, MD   1 year ago Essential hypertension   [place] [facility] Marcine Matar, MD   1 year ago Type 2 diabetes mellitus with peripheral neuropathy [facility])   [place] [facility] [place], Cornelius Moras, RPH-CPP   1 year ago Type 2 diabetes mellitus with peripheral neuropathy [facility])   [place] [facility] Marcine Matar, MD

## 2021-08-21 NOTE — Telephone Encounter (Signed)
Patient requests compression socks if possible. She had them before but from another provider when she lived in [place]. Please assist patient further.

## 2021-08-21 NOTE — Telephone Encounter (Signed)
Medication Refill - Medication: albuterol (PROVENTIL) (2.5 MG/3ML) 0.083% nebulizer solution  Has the patient contacted their pharmacy? No.   Preferred Pharmacy (with phone number or street name): [id number] [place] [place] - [address] Phone:  [phone]  Fax:  [phone]     Has the patient been seen for an appointment in the last year OR does the patient have an upcoming appointment? Yes.

## 2021-08-24 ENCOUNTER — Other Ambulatory Visit: Payer: Self-pay | Admitting: Pharmacist

## 2021-08-24 MED ORDER — ALBUTEROL SULFATE HFA 108 (90 BASE) MCG/ACT IN AERS: 1.0000 | INHALATION_SPRAY | Freq: Every day | RESPIRATORY_TRACT | 0 refills | Status: DC | PRN

## 2021-08-25 ENCOUNTER — Other Ambulatory Visit: Payer: Self-pay

## 2021-08-25 DIAGNOSIS — L309 Dermatitis, unspecified: Secondary | ICD-10-CM

## 2021-08-25 MED ORDER — TRIAMCINOLONE ACETONIDE 0.1 % EX CREA: TOPICAL_CREAM | CUTANEOUS | 0 refills | Status: DC

## 2021-08-26 ENCOUNTER — Telehealth: Payer: Self-pay

## 2021-08-26 NOTE — Telephone Encounter (Signed)
Called and left vm about paperwork for compression sock, I will leave at up at the front as well

## 2021-08-31 ENCOUNTER — Other Ambulatory Visit: Payer: Self-pay | Admitting: *Deleted

## 2021-08-31 DIAGNOSIS — R600 Localized edema: Secondary | ICD-10-CM

## 2021-09-02 NOTE — Telephone Encounter (Signed)
Patient called to find out if this information could be mailed to her home address.

## 2021-09-03 NOTE — Telephone Encounter (Signed)
Mailed to address on file.

## 2021-09-03 NOTE — Telephone Encounter (Signed)
Sent off to be mailed per pt request.

## 2021-09-18 ENCOUNTER — Other Ambulatory Visit: Payer: Self-pay | Admitting: Internal Medicine

## 2021-09-18 DIAGNOSIS — M545 Low back pain, unspecified: Secondary | ICD-10-CM | POA: Diagnosis not present

## 2021-09-18 DIAGNOSIS — M5431 Sciatica, right side: Secondary | ICD-10-CM | POA: Diagnosis not present

## 2021-09-18 DIAGNOSIS — M25561 Pain in right knee: Secondary | ICD-10-CM | POA: Diagnosis not present

## 2021-09-18 DIAGNOSIS — G8929 Other chronic pain: Secondary | ICD-10-CM | POA: Diagnosis not present

## 2021-09-18 DIAGNOSIS — M170 Bilateral primary osteoarthritis of knee: Secondary | ICD-10-CM | POA: Diagnosis not present

## 2021-09-18 DIAGNOSIS — Z79891 Long term (current) use of opiate analgesic: Secondary | ICD-10-CM | POA: Diagnosis not present

## 2021-09-18 DIAGNOSIS — G894 Chronic pain syndrome: Secondary | ICD-10-CM | POA: Diagnosis not present

## 2021-09-18 DIAGNOSIS — J4540 Moderate persistent asthma, uncomplicated: Secondary | ICD-10-CM

## 2021-09-18 DIAGNOSIS — M25562 Pain in left knee: Secondary | ICD-10-CM | POA: Diagnosis not present

## 2021-09-18 NOTE — Telephone Encounter (Signed)
Requested Prescriptions  Pending Prescriptions Disp Refills  . albuterol (PROVENTIL) (2.5 MG/3ML) 0.083% nebulizer solution [Pharmacy Med Name: ALBUTEROL SUL 2.5 MG/3 ML SOLN] 150 mL 0    Sig: INHALE 3 ML BY NEBULIZATION EVERY 6 HOURS AS NEEDED FOR WHEEZING OR SHORTNESS OF BREATH     Pulmonology:  Beta Agonists 2 Failed - 09/18/2021  1:29 PM      Failed - Last BP in normal range    BP Readings from Last 1 Encounters:  07/24/21 (!) 150/90         Passed - Last Heart Rate in normal range    Pulse Readings from Last 1 Encounters:  07/24/21 70         Passed - Valid encounter within last 12 months    Recent Outpatient Visits          1 month ago Type 2 diabetes mellitus with obesity (HCC)   [place] [facility] Marcine Matar, MD   1 year ago Essential hypertension   [place] [facility] Marcine Matar, MD   1 year ago Type 2 diabetes mellitus with peripheral neuropathy [facility])   [place] [facility] [place], Cornelius Moras, RPH-CPP   1 year ago Type 2 diabetes mellitus with peripheral neuropathy [facility])   [place] [facility] Marcine Matar, MD      Future Appointments            In 2 months Laural Benes, Binnie Rail, MD [facility]           . albuterol (VENTOLIN HFA) 108 (90 Base) MCG/ACT inhaler [Pharmacy Med Name: ALBUTEROL HFA (PROAIR) INHALER] 8.5 each 0    Sig: INHALE 1-2 PUFFS INTO THE LUNGS DAILY AS NEEDED FOR WHEEZING OR SHORTNESS OF BREATH.     Pulmonology:  Beta Agonists 2 Failed - 09/18/2021  1:29 PM      Failed - Last BP in normal range    BP Readings from Last 1 Encounters:  07/24/21 (!) 150/90         Passed - Last Heart Rate in normal range    Pulse Readings from Last 1 Encounters:  07/24/21 70         Passed - Valid encounter within last 12 months    Recent Outpatient Visits          1 month ago Type 2 diabetes mellitus with obesity  (HCC)   [place] [facility] Marcine Matar, MD   1 year ago Essential hypertension   [place] [facility] Marcine Matar, MD   1 year ago Type 2 diabetes mellitus with peripheral neuropathy [facility])   [place] [facility] [place], Cornelius Moras, RPH-CPP   1 year ago Type 2 diabetes mellitus with peripheral neuropathy [facility])   [place] [facility] Marcine Matar, MD      Future Appointments            In 2 months Laural Benes Binnie Rail, MD [facility]

## 2021-10-14 ENCOUNTER — Other Ambulatory Visit: Payer: Self-pay | Admitting: Internal Medicine

## 2021-10-16 ENCOUNTER — Ambulatory Visit: Payer: Medicaid Other | Admitting: Dietician

## 2021-10-23 ENCOUNTER — Other Ambulatory Visit: Payer: Self-pay | Admitting: Internal Medicine

## 2021-10-23 DIAGNOSIS — G8929 Other chronic pain: Secondary | ICD-10-CM | POA: Diagnosis not present

## 2021-10-23 DIAGNOSIS — M25562 Pain in left knee: Secondary | ICD-10-CM | POA: Diagnosis not present

## 2021-10-23 DIAGNOSIS — G894 Chronic pain syndrome: Secondary | ICD-10-CM | POA: Diagnosis not present

## 2021-10-23 DIAGNOSIS — Z79891 Long term (current) use of opiate analgesic: Secondary | ICD-10-CM | POA: Diagnosis not present

## 2021-10-23 DIAGNOSIS — J4540 Moderate persistent asthma, uncomplicated: Secondary | ICD-10-CM

## 2021-10-23 DIAGNOSIS — M25561 Pain in right knee: Secondary | ICD-10-CM | POA: Diagnosis not present

## 2021-10-23 DIAGNOSIS — M5431 Sciatica, right side: Secondary | ICD-10-CM | POA: Diagnosis not present

## 2021-10-23 DIAGNOSIS — M545 Low back pain, unspecified: Secondary | ICD-10-CM | POA: Diagnosis not present

## 2021-10-23 DIAGNOSIS — M1710 Unilateral primary osteoarthritis, unspecified knee: Secondary | ICD-10-CM | POA: Diagnosis not present

## 2021-10-30 ENCOUNTER — Other Ambulatory Visit: Payer: Self-pay | Admitting: Anesthesiology

## 2021-10-30 ENCOUNTER — Ambulatory Visit
Admission: RE | Admit: 2021-10-30 | Discharge: 2021-10-30 | Disposition: A | Discharge status: Home / Self Care | Payer: Medicare Other | Source: Ambulatory Visit | Attending: Anesthesiology | Admitting: Anesthesiology

## 2021-10-30 DIAGNOSIS — M170 Bilateral primary osteoarthritis of knee: Secondary | ICD-10-CM

## 2021-11-09 ENCOUNTER — Other Ambulatory Visit: Payer: Self-pay | Admitting: Internal Medicine

## 2021-11-09 DIAGNOSIS — J4540 Moderate persistent asthma, uncomplicated: Secondary | ICD-10-CM

## 2021-11-09 NOTE — Telephone Encounter (Signed)
Requested Prescriptions  Pending Prescriptions Disp Refills  . ADVAIR DISKUS 100-50 MCG/ACT AEPB [Pharmacy Med Name: ADVAIR 100-50 DISKUS] 1 each 3    Sig: INHALE 1 PUFF INTO THE LUNGS TWICE A DAY     Pulmonology:  Combination Products Passed - 11/09/2021 12:14 AM      Passed - Valid encounter within last 12 months    Recent Outpatient Visits          3 months ago Type 2 diabetes mellitus with obesity ([place])   [place] Ladell Pier, MD   1 year ago Essential hypertension   [place], Deborah B, MD   1 year ago Type 2 diabetes mellitus with peripheral neuropathy [facility])   [place], Jarome Matin, RPH-CPP   1 year ago Type 2 diabetes mellitus with peripheral neuropathy [facility])   [place], MD      Future Appointments            In 2 weeks Ladell Pier, MD [place]

## 2021-11-17 ENCOUNTER — Emergency Department (HOSPITAL_COMMUNITY): Payer: Medicare Other

## 2021-11-17 ENCOUNTER — Encounter (HOSPITAL_COMMUNITY): Payer: Self-pay

## 2021-11-17 ENCOUNTER — Emergency Department (HOSPITAL_COMMUNITY)
Admission: EM | Admit: 2021-11-17 | Discharge: 2021-11-17 | Disposition: A | Discharge status: Home / Self Care | Payer: Medicare Other | Attending: Emergency Medicine | Admitting: Emergency Medicine

## 2021-11-17 ENCOUNTER — Other Ambulatory Visit: Payer: Self-pay

## 2021-11-17 DIAGNOSIS — W01198A Fall on same level from slipping, tripping and stumbling with subsequent striking against other object, initial encounter: Secondary | ICD-10-CM | POA: Diagnosis not present

## 2021-11-17 DIAGNOSIS — M545 Low back pain, unspecified: Secondary | ICD-10-CM | POA: Diagnosis not present

## 2021-11-17 DIAGNOSIS — S0990XA Unspecified injury of head, initial encounter: Secondary | ICD-10-CM | POA: Diagnosis not present

## 2021-11-17 DIAGNOSIS — Z794 Long term (current) use of insulin: Secondary | ICD-10-CM | POA: Insufficient documentation

## 2021-11-17 DIAGNOSIS — M25552 Pain in left hip: Secondary | ICD-10-CM | POA: Diagnosis not present

## 2021-11-17 DIAGNOSIS — W19XXXA Unspecified fall, initial encounter: Secondary | ICD-10-CM

## 2021-11-17 DIAGNOSIS — Y990 Civilian activity done for income or pay: Secondary | ICD-10-CM | POA: Diagnosis not present

## 2021-11-17 DIAGNOSIS — M25562 Pain in left knee: Secondary | ICD-10-CM | POA: Insufficient documentation

## 2021-11-17 MED ORDER — METHOCARBAMOL 500 MG PO TABS: 500.0000 mg | ORAL_TABLET | Freq: Two times a day (BID) | ORAL | 0 refills | Status: DC

## 2021-11-17 MED ORDER — DICLOFENAC SODIUM 1 % EX GEL: 2.0000 g | Freq: Four times a day (QID) | CUTANEOUS | 0 refills | Status: DC

## 2021-11-17 MED ORDER — HYDROCODONE-ACETAMINOPHEN 5-325 MG PO TABS
1.0000 | ORAL_TABLET | Freq: Once | ORAL | Status: AC
  Administered 2021-11-17: 1 via ORAL
  Filled 2021-11-17: qty 1

## 2021-11-17 NOTE — ED Provider Triage Note (Signed)
Emergency Medicine Provider Triage Evaluation Note  Laurie Andrews , a 51 y.o. female  was evaluated in triage.  Pt complains of left leg pain after fall that occurred at work yesterday when patient was walking backwards and tripped and fell landing on her buttocks, hitting her back and head on the floor.  No loss of consciousness, is not anticoagulated.  Reports pain primarily in her left leg.  States that she is supposed to be having surgery on her left knee for chronic knee problems.  Review of Systems  Positive: As above  Negative: Vomiting, confusion  Physical Exam  Ht 5\' 1"  (1.549 m)   Wt 77.6 kg   BMI 32.31 kg/m  Gen:   Awake, no distress   Resp:  Normal effort  MSK:   Pain with palpation diffuse left leg Other:  No midline neck/back tenderness  Medical Decision Making  Medically screening exam initiated at 11:31 AM.  Appropriate orders placed.  Laurie Andrews was informed that the remainder of the evaluation will be completed by another provider, this initial triage assessment does not replace that evaluation, and the importance of remaining in the ED until their evaluation is complete.     Tacy Learn, PA-C 11/17/21 1134

## 2021-11-17 NOTE — ED Triage Notes (Signed)
Patient reports fell yesterday at work reports she stepped back and tripped over something causing her to fall on butt and back and striking head.  Patient complains of dizziness but not LOC.  Patient complains of left leg pain.

## 2021-11-17 NOTE — ED Notes (Signed)
Patient verbalizes understanding of discharge instructions. Opportunity for questioning and answers were provided. Armband removed by staff, pt discharged from ED. Pt taken to ED waiting room via wheel chair.  

## 2021-11-17 NOTE — ED Provider Notes (Signed)
[facility] EMERGENCY DEPARTMENT Provider Note   CSN: [id number] Arrival date & time: 11/17/21  1026     History  Chief Complaint  Patient presents with   Laurie Andrews    Laurie Andrews is a 51 y.o. female.  Pt complains of left leg pain after fall that occurred at work yesterday when patient was walking backwards and tripped and fell landing on her buttocks, hitting her back and head on the floor.  No loss of consciousness, is not anticoagulated.  Reports pain primarily in her left leg.  States that she is supposed to be having surgery on her left knee for chronic knee problems.       Home Medications Prior to Admission medications   Medication Sig Start Date End Date Taking? Authorizing Provider  methocarbamol (ROBAXIN) 500 MG tablet Take 1 tablet (500 mg total) by mouth 2 (two) times daily. 11/17/21  Yes Tacy Learn, PA-C  Accu-Chek Softclix Lancets lancets Use as instructed 07/24/21   Ladell Pier, MD  ADVAIR DISKUS 100-50 MCG/ACT AEPB INHALE 1 PUFF INTO THE LUNGS TWICE A DAY 11/09/21   Ladell Pier, MD  albuterol (PROVENTIL) (2.5 MG/3ML) 0.083% nebulizer solution INHALE 3 ML BY NEBULIZATION EVERY 6 HOURS AS NEEDED FOR WHEEZING OR SHORTNESS OF BREATH 10/23/21   Charlott Rakes, MD  albuterol (VENTOLIN HFA) 108 (90 Base) MCG/ACT inhaler INHALE 1-2 PUFFS INTO THE LUNGS DAILY AS NEEDED FOR WHEEZING OR SHORTNESS OF BREATH. 10/14/21   Ladell Pier, MD  amitriptyline (ELAVIL) 100 MG tablet Take 100 mg by mouth as needed for sleep. 12/29/18   [provider]  atorvastatin (LIPITOR) 10 MG tablet Take 1 tablet (10 mg total) by mouth daily. 08/04/21   Ladell Pier, MD  blood glucose meter kit and supplies KIT Dispense based on patient and insurance preference. Use up to four times daily as directed. (FOR ICD-9 250.00, 250.01). 09/07/19   Darr, Edison Nasuti, PA-C  blood glucose meter kit and supplies KIT Dispense based on patient and insurance  preference. Use up to four times daily as directed. 04/09/20   Dwyane Dee, MD  Blood Glucose Monitoring Suppl (ACCU-CHEK GUIDE) w/Device KIT UAD 07/24/21   Ladell Pier, MD  cetirizine (ZYRTEC) 10 MG tablet Take 10 mg by mouth daily. 06/16/11   [provider]  Cholecalciferol 25 MCG (1000 UT) capsule Take 1,000 Units by mouth daily. 04/16/13   [provider]  diclofenac Sodium (VOLTAREN) 1 % GEL Apply 2 g topically 4 (four) times daily. 11/17/21   Tacy Learn, PA-C  GAVILAX 17 GM/SCOOP powder Take 17 g by mouth daily as needed for mild constipation. 09/04/19   [provider]  glucose blood (ACCU-CHEK GUIDE) test strip Use as instructed 07/24/21   Ladell Pier, MD  insulin glargine (LANTUS SOLOSTAR) 100 UNIT/ML Solostar Pen Inject 15 Units into the skin at bedtime. 07/24/21   Ladell Pier, MD  insulin lispro (HUMALOG KWIKPEN) 100 UNIT/ML KwikPen Inject 5 Units into the skin 3 (three) times daily. Take with meals 07/24/21   Ladell Pier, MD  Insulin Pen Needle (PEN NEEDLES) 31G X 8 MM MISC UAD 05/08/20   Ladell Pier, MD  losartan (COZAAR) 100 MG tablet Take 1 tablet (100 mg total) by mouth daily. 07/24/21   Ladell Pier, MD  montelukast (SINGULAIR) 10 MG tablet Take 10 mg by mouth daily. 08/29/19   [provider]  naloxone [facility]) nasal spray 4 mg/0.1 mL SMARTSIG:Both  Nares 07/10/20   [provider]  naratriptan (AMERGE) 2.5 MG tablet Take 2.5 mg by mouth as needed for migraine. 03/05/19   [provider]  nystatin cream (MYCOSTATIN) Apply 1 application topically 3 (three) times daily as needed for dry skin. 01/05/20   [provider]  ondansetron (ZOFRAN ODT) 4 MG disintegrating tablet Take 1 tablet (4 mg total) by mouth every 8 (eight) hours as needed for nausea or vomiting. 04/09/20   Dwyane Dee, MD  oxyCODONE (OXY IR/ROXICODONE) 5 MG immediate release tablet Take 5 mg by mouth in the morning, at noon,  and at bedtime.    [provider]  pantoprazole (PROTONIX) 40 MG tablet Take 40 mg by mouth 2 (two) times daily. 08/29/19   [provider]  senna-docusate (SENOKOT-S) 8.6-50 MG tablet Take 1 tablet by mouth 2 (two) times daily. 04/09/20   Dwyane Dee, MD  topiramate (TOPAMAX) 25 MG tablet Take 50 mg by mouth 2 (two) times daily. 03/20/19   [provider]  triamcinolone cream (KENALOG) 0.1 % APPLY TO AFFECTED AREA TWICE A DAY 08/25/21   Ladell Pier, MD      Allergies    Keflex [cephalexin], Metformin and related, Trulicity [dulaglutide], Aspirin, Morphine and related, and Penicillins    Review of Systems   Review of Systems Negative except as per HPI Physical Exam Updated Vital Signs BP 138/89 (BP Location: Right Arm)   Pulse 90   Temp 99.1 F (37.3 C) (Oral)   Resp 18   Ht 5' 1" (1.549 m)   Wt 77.6 kg   SpO2 96%   BMI 32.31 kg/m  Physical Exam Vitals and nursing note reviewed.  Constitutional:      General: She is not in acute distress.    Appearance: She is well-developed. She is not diaphoretic.  HENT:     Head: Normocephalic and atraumatic.  Eyes:     Extraocular Movements: Extraocular movements intact.     Pupils: Pupils are equal, round, and reactive to light.  Cardiovascular:     Pulses: Normal pulses.  Pulmonary:     Effort: Pulmonary effort is normal.  Musculoskeletal:        General: Tenderness present. No swelling or deformity.     Cervical back: Normal range of motion and neck supple. No tenderness or bony tenderness.     Thoracic back: No bony tenderness.     Lumbar back: No bony tenderness.       Back:     Right lower leg: No edema.     Left lower leg: No edema.     Comments: No pain with log roll right hip Left leg diffusely tender without deformity, ecchymosis, erythema, skin intact. Is able to plantar and dorsiflex although doing so creates pain in the knee. Diffuse left knee pain, pain with log roll left hip.    Skin:    General: Skin is warm and dry.     Findings: No erythema or rash.  Neurological:     Mental Status: She is alert and oriented to person, place, and time.     Cranial Nerves: No cranial nerve deficit.     Sensory: No sensory deficit.     Deep Tendon Reflexes: Babinski sign absent on the right side. Babinski sign absent on the left side.     Reflex Scores:      Patellar reflexes are 2+ on the right side and 2+ on the left side.  Achilles reflexes are 1+ on the right side and 1+ on the left side. Psychiatric:        Behavior: Behavior normal.     ED Results / Procedures / Treatments   Labs (all labs ordered are listed, but only abnormal results are displayed) Labs Reviewed - No data to display  EKG None  Radiology DG Lumbar Spine Complete  Result Date: 11/17/2021 CLINICAL DATA:  Fall, low back pain EXAM: LUMBAR SPINE - COMPLETE 4+ VIEW COMPARISON:  04/01/2021 FINDINGS: No fracture or dislocation of the lumbar spine. Lumbar disc spaces and vertebral body heights are preserved. Mild multilevel facet degenerative change of the lower lumbar levels. Nonobstructive pattern of overlying bowel gas. IMPRESSION: 1.  No fracture or dislocation of the lumbar spine. 2. Lumbar disc spaces and vertebral body heights are preserved. Mild multilevel facet degenerative change of the lower lumbar levels. Lumbar disc and neural foraminal pathology may be further evaluated by MRI if indicated by localizing signs and symptoms. Electronically Signed   By: Delanna Ahmadi M.D.   On: 11/17/2021 12:23   DG Hip Unilat With Pelvis 2-3 Views Left  Result Date: 11/17/2021 CLINICAL DATA:  Left hip pain.  Status post fall. EXAM: DG HIP (WITH OR WITHOUT PELVIS) 2-3V LEFT COMPARISON:  CT AP 04/05/2020 FINDINGS: There is no evidence of hip fracture or dislocation. There is no evidence of arthropathy or other focal bone abnormality. There is a linear lucency which extends through the proximal aspect of the  right femur. This may reflect skin fold artifact. If there are any symptoms referable to the right hip/right femur recommend dedicated views of the right hip. IMPRESSION: 1. Normal appearance of the left hip without signs of acute fracture or dislocation. 2. Linear lucency through the proximal right femur is noted which may represent skin fold artifact. If there are symptoms referable to the right lower extremity, dedicated views of the right hip would be advised. Electronically Signed   By: Kerby Moors M.D.   On: 11/17/2021 12:21   DG Knee Complete 4 Views Left  Result Date: 11/17/2021 CLINICAL DATA:  Fall yesterday, left knee pain EXAM: LEFT KNEE - COMPLETE 4+ VIEW COMPARISON:  10/30/2021 FINDINGS: No fracture or dislocation of the left knee. Mild tricompartmental joint space narrowing and osteophytosis. No knee joint effusion. Soft tissues unremarkable. IMPRESSION: No fracture or dislocation of the left knee. Mild tricompartmental arthrosis. Electronically Signed   By: Delanna Ahmadi M.D.   On: 11/17/2021 12:17    Procedures Procedures    Medications Ordered in ED Medications  HYDROcodone-acetaminophen (NORCO/VICODIN) 5-325 MG per tablet 1 tablet (has no administration in time range)    ED Course/ Medical Decision Making/ A&P                           Medical Decision Making Amount and/or Complexity of Data Reviewed Radiology: ordered.  Risk Prescription drug management.   51 year old female presents for pain in the low back as well as left leg and headache after she fell backwards at work yesterday, landing on her buttocks/back/head. No LOC, not anticoagulated, no reports of vomiting or repetitive questioning. No focal findings to suggest skull fx, neuro exam normal. Has left lower back pain as well as diffuse left leg pain. XR of the L-spine, left hip, left knee as ordered and interpreted by myself- no obvious bony acute bony abnormality, agree with radiologist interpretation.  Reflexes intact in lower extremities. Discussed  with Dr. Doren Custard, ER attending, lumbar spine imaging reviewed, emergent MRI of the lumbar spine not indicated at this time. No pain in the right hip, does not need dedicated right hip imaging at this time. Plan is for norco for pain today, crutches for left leg pain. Can continue with home narcotic pain medication, rx for diclofenac topical as well as robaxin. Recommend recheck with [facility] provider in 2 days for further management.         Final Clinical Impression(s) / ED Diagnoses Final diagnoses:  Fall, initial encounter  Minor head injury, initial encounter  Acute left-sided low back pain without sciatica  Acute hip pain, left  Acute pain of left knee    Rx / DC Orders ED Discharge Orders          Ordered    diclofenac Sodium (VOLTAREN) 1 % GEL  4 times daily        11/17/21 1756    methocarbamol (ROBAXIN) 500 MG tablet  2 times daily        11/17/21 1756              Tacy Learn, PA-C 11/17/21 1804    Godfrey Pick, MD 11/18/21 0021

## 2021-11-17 NOTE — Discharge Instructions (Addendum)
Take your oxycodone as prescribed.  Can apply Voltaren gel as prescribed. Prescription for Robaxin sent to your pharmacy, this is a muscle relaxant.  Do not take other muscle relaxers you may have at home while taking this medication.  Recheck with your [place] in 2 days.

## 2021-11-19 ENCOUNTER — Ambulatory Visit: Payer: Self-pay | Admitting: *Deleted

## 2021-11-19 NOTE — Telephone Encounter (Signed)
Routing to CMA 

## 2021-11-19 NOTE — Telephone Encounter (Signed)
Summary: leg hip and head discomfort   The patient fell on Monday 11/16/21 at work and hurt their leg, hip and head   The patient shares that they have been to the emergency department but continue to experience discomfort   Please contact the patient further when possible        Chief Complaint: pain left leg and hip from a fall. Requesting appt for Workmans comp Symptoms: fell Monday 11/16/21 at work falling backwards hitting left side buttocks area and back of head. Requesting appt for pain management and WC. Reports she has to keep moving due to pain worsening sitting. Left knee swelling and "feels like it is mushy" when walking. Can barely walk. C/o nausea denies headache , no dizziness no vomiting. Reports back of head soreness to touch no swelling. Can not take NSAIDS and robaxin makes her feel "Crazy". Taking oxycodone from pain clinic and will see provider at pain clinic tomorrow.  Frequency: 11/16/21 Pertinent Negatives: Patient denies headaches, no worsening sx of head injury.  Disposition: [] ED /[] Urgent Care (no appt availability in office) / [] Appointment(In office/virtual)/ []  [facility]/ [] Home Care/ [] Refused Recommended Disposition /[] [facility]/ [x]  Follow-up with PCP Additional Notes:   Patient requesting appt asap due to workman's comp due to fall at work. Please advise .      Reason for Disposition  [1] Caller has NON-URGENT question AND [2] triager unable to answer question  Answer Assessment - Initial Assessment Questions 1. MECHANISM: "How did the fall happen?"     Fell at work backwards and hit left side buttocks and back of head 2. DOMESTIC VIOLENCE AND ELDER ABUSE SCREENING: "Did you fall because someone pushed you or tried to hurt you?" If Yes, ask: "Are you safe now?"     na 3. ONSET: "When did the fall happen?" (e.g., minutes, hours, or days ago)     Monday 11/16/21 at work and now out for [facility] 4. LOCATION: "What part of the body hit  the ground?" (e.g., back, buttocks, head, hips, knees, hands, head, stomach)     Left hip , left leg . Back of head  5. INJURY: "Did you hurt (injure) yourself when you fell?" If Yes, ask: "What did you injure? Tell me more about this?" (e.g., body area; type of injury; pain severity)"     Yes . Went to ED patient reports no break or fracture to left hip of leg. Left knee swelling noted where she had previous surgery. No report of bleed or head trauma reported by patient  6. PAIN: "Is there any pain?" If Yes, ask: "How bad is the pain?" (e.g., Scale 1-10; or mild,  moderate, severe)   - NONE (0): No pain   - MILD (1-3): Doesn't interfere with normal activities    - MODERATE (4-7): Interferes with normal activities or awakens from sleep    - SEVERE (8-10): Excruciating pain, unable to do any normal activities      Interferes with mobility, unable to sit for extended times 7. SIZE: For cuts, bruises, or swelling, ask: "How large is it?" (e.g., inches or centimeters)      Swelling to left leg and knee 8. PREGNANCY: "Is there any chance you are pregnant?" "When was your last menstrual period?"     na 9. OTHER SYMPTOMS: "Do you have any other symptoms?" (e.g., dizziness, fever, weakness; new onset or worsening).      Nausea no vomiting. Blurred vision which patient reports not new.  Back of head sore to touch denies swelling  10. CAUSE: "What do you think caused the fall (or falling)?" (e.g., tripped, dizzy spell)       Something was behind patient and she fell over it hitting left side buttocks area and back of head  Protocols used: Falls and [facility]

## 2021-11-20 DIAGNOSIS — G894 Chronic pain syndrome: Secondary | ICD-10-CM | POA: Diagnosis not present

## 2021-11-20 DIAGNOSIS — M545 Low back pain, unspecified: Secondary | ICD-10-CM | POA: Diagnosis not present

## 2021-11-20 DIAGNOSIS — M170 Bilateral primary osteoarthritis of knee: Secondary | ICD-10-CM | POA: Diagnosis not present

## 2021-11-20 DIAGNOSIS — M25562 Pain in left knee: Secondary | ICD-10-CM | POA: Diagnosis not present

## 2021-11-20 DIAGNOSIS — Z79891 Long term (current) use of opiate analgesic: Secondary | ICD-10-CM | POA: Diagnosis not present

## 2021-11-20 DIAGNOSIS — M25561 Pain in right knee: Secondary | ICD-10-CM | POA: Diagnosis not present

## 2021-11-20 DIAGNOSIS — G8929 Other chronic pain: Secondary | ICD-10-CM | POA: Diagnosis not present

## 2021-11-20 DIAGNOSIS — M5431 Sciatica, right side: Secondary | ICD-10-CM | POA: Diagnosis not present

## 2021-11-23 ENCOUNTER — Telehealth: Payer: Self-pay | Admitting: Licensed Clinical Social Worker

## 2021-11-23 NOTE — Patient Instructions (Signed)
Visit Information  Thank you for taking time to visit with me today. Please don't hesitate to contact me if I can be of assistance to you.   Following are the goals we discussed today:   Goals Addressed             This Visit's Progress    COMPLETED: Care Coordination Activities-No Follow Up Required       Care Coordination Interventions: Solution-Focused Strategies employed:  Active listening / Reflection utilized  Emotional Support Provided Verbalization of feelings encouraged  Pt endorses soreness in her whole body noting legs and hips are in pain. Patient sustained injuries while at work and was assessed at ED. Currently she is out of work, stating she walks with a bad limp and has difficulty bending knee. States she has a knot on left thigh Pt reports that she cant take prescribed med, Robaxin, stating, "it makes me feel crazy and weird. It doesn't take away my pain" Pt participates in pain clinic Adult daughter provides limited support for pt LCSW informed patient of care coordination services. Pt is not interested at this time and agreed to contact PCP, should needs arise  LCSW reviewed upcoming appts             If you are experiencing a Mental Health or [place] or need someone to talk to, please call the Suicide and Crisis Lifeline: 988 call 911   Patient verbalizes understanding of instructions and care plan provided today and agrees to view in [place]. Active MyChart status and patient understanding of how to access instructions and care plan via MyChart confirmed with patient.     No further follow up required:    Christa See, MSW, [email or web address] Phone [phone] 5:31 PM

## 2021-11-23 NOTE — Patient Outreach (Signed)
  Care Coordination   Initial Visit Note   11/23/2021 Name: Cindi Ghazarian MRN: [id number] DOB: 1970/09/16  Laurie Andrews is a 51 y.o. year old female who sees Ladell Pier, MD for primary care. I spoke with  Erskine Speed by phone today.  What matters to the patients health and wellness today?  Care Coordination    Goals Addressed             This Visit's Progress    COMPLETED: Care Coordination Activities-No Follow Up Required       Care Coordination Interventions: Solution-Focused Strategies employed:  Active listening / Reflection utilized  Emotional Support Provided Verbalization of feelings encouraged  Pt endorses soreness in her whole body noting legs and hips are in pain. Patient sustained injuries while at work and was assessed at ED. Currently she is out of work, stating she walks with a bad limp and has difficulty bending knee. States she has a knot on left thigh Pt reports that she cant take prescribed med, Robaxin, stating, "it makes me feel crazy and weird. It doesn't take away my pain" Pt participates in pain clinic Adult daughter provides limited support for pt LCSW informed patient of care coordination services. Pt is not interested at this time and agreed to contact PCP, should needs arise  LCSW reviewed upcoming appts             SDOH assessments and interventions completed:  No     Care Coordination Interventions Activated:  Yes  Care Coordination Interventions:  Yes, provided   Follow up plan: No further intervention required.   Encounter Outcome:  Pt. Refused   Christa See, MSW, [email or web address] Phone [phone] 5:31 PM

## 2021-11-27 ENCOUNTER — Ambulatory Visit: Payer: Medicare Other | Attending: Internal Medicine | Admitting: Internal Medicine

## 2021-11-27 ENCOUNTER — Encounter: Payer: Self-pay | Admitting: Internal Medicine

## 2021-11-27 VITALS — BP 141/85 | HR 76 | Temp 98.2°F | Ht 61.0 in | Wt 170.2 lb

## 2021-11-27 DIAGNOSIS — Z9181 History of falling: Secondary | ICD-10-CM | POA: Diagnosis not present

## 2021-11-27 DIAGNOSIS — Z7189 Other specified counseling: Secondary | ICD-10-CM | POA: Diagnosis not present

## 2021-11-27 DIAGNOSIS — Z0001 Encounter for general adult medical examination with abnormal findings: Secondary | ICD-10-CM | POA: Diagnosis not present

## 2021-11-27 DIAGNOSIS — E1169 Type 2 diabetes mellitus with other specified complication: Secondary | ICD-10-CM

## 2021-11-27 DIAGNOSIS — Z6832 Body mass index (BMI) 32.0-32.9, adult: Secondary | ICD-10-CM

## 2021-11-27 DIAGNOSIS — M79606 Pain in leg, unspecified: Secondary | ICD-10-CM | POA: Diagnosis not present

## 2021-11-27 DIAGNOSIS — E1159 Type 2 diabetes mellitus with other circulatory complications: Secondary | ICD-10-CM | POA: Diagnosis not present

## 2021-11-27 DIAGNOSIS — Z0000 Encounter for general adult medical examination without abnormal findings: Secondary | ICD-10-CM

## 2021-11-27 DIAGNOSIS — Z23 Encounter for immunization: Secondary | ICD-10-CM

## 2021-11-27 DIAGNOSIS — M172 Bilateral post-traumatic osteoarthritis of knee: Secondary | ICD-10-CM | POA: Diagnosis not present

## 2021-11-27 DIAGNOSIS — Z1159 Encounter for screening for other viral diseases: Secondary | ICD-10-CM

## 2021-11-27 DIAGNOSIS — E669 Obesity, unspecified: Secondary | ICD-10-CM

## 2021-11-27 DIAGNOSIS — I152 Hypertension secondary to endocrine disorders: Secondary | ICD-10-CM | POA: Diagnosis not present

## 2021-11-27 MED ORDER — AMLODIPINE BESYLATE 5 MG PO TABS: 5.0000 mg | ORAL_TABLET | Freq: Every day | ORAL | 2 refills | Status: DC

## 2021-11-27 MED ORDER — NALOXONE HCL 4 MG/0.1ML NA LIQD: NASAL | 1 refills | Status: DC

## 2021-11-27 NOTE — Progress Notes (Unsigned)
Subjective:   Laurie Andrews is a 51 y.o. female who presents for an Initial Medicare Annual Wellness Visit. Patient with history of DM type II with peripheral neuropathy, HTN, opiate dependence, anxiety disorder, asthma, migraines, OA knees, alopecia.   Review of Systems    MSK:  fell 11/16/2021. Box of bags behind her.  Fell on hip and hit head Pain in LT leg and swollen.  Has to get up after a period.  Out of work through [employer].  Pain shoots up leg.  Pain in thigh when she walks.  Feels like knee will give out.  Buttock feels swollen.  Has a knee brace from pain doctor a while ago - from thigh all way down.  Can not walk well with it.   Cardiac Risk Factors include: none     Objective:    Today's Vitals   11/27/21 1110  BP: (!) 141/85  Pulse: 76  Temp: 98.2 F (36.8 C)  TempSrc: Oral  SpO2: 100%  Weight: 170 lb 3.2 oz (77.2 kg)  Height: 5\' 1"  (1.549 m)  PainSc: 10-Worst pain ever   Body mass index is 32.16 kg/m.     11/27/2021   11:17 AM 11/17/2021   11:32 AM 04/05/2020    1:08 PM 09/07/2019   10:02 AM 09/05/2019    6:55 PM  Advanced Directives  Does Patient Have a Medical Advance Directive? No No No No No  Would patient like information on creating a medical advance directive? Yes (ED - Information included in AVS) No - Patient declined No - Patient declined No - Patient declined No - Patient declined    Current Medications (verified) Outpatient Encounter Medications as of 11/27/2021  Medication Sig   Accu-Chek Softclix Lancets lancets Use as instructed   ADVAIR DISKUS 100-50 MCG/ACT AEPB INHALE 1 PUFF INTO THE LUNGS TWICE A DAY   albuterol (PROVENTIL) (2.5 MG/3ML) 0.083% nebulizer solution INHALE 3 ML BY NEBULIZATION EVERY 6 HOURS AS NEEDED FOR WHEEZING OR SHORTNESS OF BREATH   albuterol (VENTOLIN HFA) 108 (90 Base) MCG/ACT inhaler INHALE 1-2 PUFFS INTO THE LUNGS DAILY AS NEEDED FOR WHEEZING OR SHORTNESS OF BREATH.   atorvastatin (LIPITOR) 10 MG tablet Take  1 tablet (10 mg total) by mouth daily.   blood glucose meter kit and supplies KIT Dispense based on patient and insurance preference. Use up to four times daily as directed. (FOR ICD-9 250.00, 250.01).   blood glucose meter kit and supplies KIT Dispense based on patient and insurance preference. Use up to four times daily as directed.   Blood Glucose Monitoring Suppl (ACCU-CHEK GUIDE) w/Device KIT UAD   cetirizine (ZYRTEC) 10 MG tablet Take 10 mg by mouth daily.   diclofenac Sodium (VOLTAREN) 1 % GEL Apply 2 g topically 4 (four) times daily.   GAVILAX 17 GM/SCOOP powder Take 17 g by mouth daily as needed for mild constipation.   glucose blood (ACCU-CHEK GUIDE) test strip Use as instructed   insulin glargine (LANTUS SOLOSTAR) 100 UNIT/ML Solostar Pen Inject 15 Units into the skin at bedtime.   insulin lispro (HUMALOG KWIKPEN) 100 UNIT/ML KwikPen Inject 5 Units into the skin 3 (three) times daily. Take with meals   Insulin Pen Needle (PEN NEEDLES) 31G X 8 MM MISC UAD   losartan (COZAAR) 100 MG tablet Take 1 tablet (100 mg total) by mouth daily.   montelukast (SINGULAIR) 10 MG tablet Take 10 mg by mouth daily.   naloxone (NARCAN) nasal spray 4 mg/0.1 mL SMARTSIG:Both Nares  naratriptan (AMERGE) 2.5 MG tablet Take 2.5 mg by mouth as needed for migraine.   nystatin cream (MYCOSTATIN) Apply 1 application topically 3 (three) times daily as needed for dry skin.   ondansetron (ZOFRAN ODT) 4 MG disintegrating tablet Take 1 tablet (4 mg total) by mouth every 8 (eight) hours as needed for nausea or vomiting.   oxyCODONE (OXY IR/ROXICODONE) 5 MG immediate release tablet Take 5 mg by mouth in the morning, at noon, and at bedtime.   pantoprazole (PROTONIX) 40 MG tablet Take 40 mg by mouth 2 (two) times daily.   senna-docusate (SENOKOT-S) 8.6-50 MG tablet Take 1 tablet by mouth 2 (two) times daily.   topiramate (TOPAMAX) 25 MG tablet Take 50 mg by mouth 2 (two) times daily.   triamcinolone cream (KENALOG) 0.1  % APPLY TO AFFECTED AREA TWICE A DAY   amitriptyline (ELAVIL) 100 MG tablet Take 100 mg by mouth as needed for sleep. (Patient not taking: Reported on 11/27/2021)   Cholecalciferol 25 MCG (1000 UT) capsule Take 1,000 Units by mouth daily. (Patient not taking: Reported on 11/27/2021)   methocarbamol (ROBAXIN) 500 MG tablet Take 1 tablet (500 mg total) by mouth 2 (two) times daily. (Patient not taking: Reported on 11/27/2021)   No facility-administered encounter medications on file as of 11/27/2021.    Allergies (verified) Keflex [cephalexin], Metformin and related, Trulicity [dulaglutide], Aspirin, Morphine and related, and Penicillins   History: Past Medical History:  Diagnosis Date   Anxiety    Asthma    Diabetes mellitus without complication ([place])    Hypertension    Past Surgical History:  Procedure Laterality Date   BREAST SURGERY     CHOLECYSTECTOMY     HIGH RISK BREAST EXCISION     KNEE SURGERY     Family History  Problem Relation Age of Onset   Breast cancer Maternal Aunt    Social History   Socioeconomic History   Marital status: Single    Spouse name: Not on file   Number of children: Not on file   Years of education: Not on file   Highest education level: Not on file  Occupational History   Not on file  Tobacco Use   Smoking status: Never   Smokeless tobacco: Never  Vaping Use   Vaping Use: Never used  Substance and Sexual Activity   Alcohol use: Not Currently   Drug use: Never   Sexual activity: Not on file  Other Topics Concern   Not on file  Social History Narrative   Not on file   Social Determinants of Health   Financial Resource Strain: Low Risk  (11/27/2021)   Overall Financial Resource Strain (CARDIA)    Difficulty of Paying Living Expenses: Not very hard  Food Insecurity: No Food Insecurity (11/27/2021)   Hunger Vital Sign    Worried About Running Out of Food in the Last Year: Never true    Ran Out of Food in the Last Year: Never true   Transportation Needs: No Transportation Needs (11/27/2021)   PRAPARE - Hydrologist (Medical): No    Lack of Transportation (Non-Medical): No  Physical Activity: Insufficiently Active (11/27/2021)   Exercise Vital Sign    Days of Exercise per Week: 7 days    Minutes of Exercise per Session: 10 min  Stress: No Stress Concern Present (11/27/2021)   [place]    Feeling of Stress : Not at all  Social  Connections: Socially Isolated (11/27/2021)   Social Connection and Isolation Panel [NHANES]    Frequency of Communication with Friends and Family: Three times a week    Frequency of Social Gatherings with Friends and Family: Never    Attends Religious Services: Never    Marine scientist or Organizations: No    Attends Music therapist: Never    Marital Status: Never married    Tobacco Counseling Pt does not smoke Does not drink ETOH beverages  Clinical Intake:  Pre-visit preparation completed: No  Pain : 0-10 Pain Score: 10-Worst pain ever Pain Location: Knee Pain Orientation: Left     Diabetes: Yes CBG done?: No  How often do you need to have someone help you when you read instructions, pamphlets, or other written materials from your doctor or pharmacy?: 1 - Never  Diabetic?yes  Interpreter Needed?: No      Activities of Daily Living    11/27/2021   11:17 AM  In your present state of health, do you have any difficulty performing the following activities:  Hearing? 0  Vision? 0  Difficulty concentrating or making decisions? 0  Walking or climbing stairs? 1  Dressing or bathing? 0  Doing errands, shopping? 0  Preparing Food and eating ? N  Using the Toilet? N  In the past six months, have you accidently leaked urine? N  Do you have problems with loss of bowel control? N  Managing your Medications? N  Managing your Finances? N  Housekeeping or  managing your Housekeeping? N    Patient Care Team: Ladell Pier, MD as PCP - General (Internal Medicine)  Indicate any recent Medical Services you may have received from other than [facility] providers in the past year (date may be approximate).     Assessment:   This is a routine wellness examination for [place].  Hearing/Vision screen No results found.  Dietary issues and exercise activities discussed: Current Exercise Habits: Home exercise routine, Type of exercise: walking, Time (Minutes): 15, Frequency (Times/Week): 7, Weekly Exercise (Minutes/Week): 105, Intensity: Mild, Exercise limited by: None identified   Goals Addressed   None   Depression Screen    11/27/2021   11:17 AM 07/24/2021    2:08 PM 05/08/2020    9:59 AM  PHQ 2/9 Scores  PHQ - 2 Score 0 0 0  PHQ- 9 Score  1     Fall Risk    11/27/2021   11:17 AM 07/24/2021    2:08 PM 05/08/2020    9:58 AM  Fall Risk   Falls in the past year? 1 0 1  Number falls in past yr: 0 0 0  Injury with Fall? 1 0 0    FALL RISK PREVENTION PERTAINING TO THE HOME:  Any stairs in or around the home? No  If so, are there any without handrails?  NA Home free of loose throw rugs in walkways, pet beds, electrical cords, etc? Yes  Adequate lighting in your home to reduce risk of falls? Yes   ASSISTIVE DEVICES UTILIZED TO PREVENT FALLS:  Life alert? No  Use of a cane, walker or w/c? No  Grab bars in the bathroom? No , would like one.  Has arthritis in knees.  Sees [facility] Shower chair or bench in shower? No , feels she needs one Elevated toilet seat or a handicapped toilet? Yes   TIMED UP AND GO:  Was the test performed? {YES/NO:21197}.  Length of time to ambulate 10  feet: *** sec.   {Appearance of WXOD:9537962}  Cognitive Function:        11/27/2021   11:19 AM  6CIT Screen  What Year? 0 points  What month? 0 points  What time? 0 points  Count back from 20 0 points  Months in reverse 2 points   Repeat phrase 0 points  Total Score 2 points    Immunizations Immunization History  Administered Date(s) Administered   Influenza Split 04/11/2012   Influenza,inj,Quad PF,6+ Mos 05/08/2020   Zoster Recombinat (Shingrix) 10/15/2020, 07/24/2021    {TDAP status:2101805}  {Flu Vaccine status:2101806}  {Pneumococcal vaccine status:2101807}  {Covid-19 vaccine status:2101808}  Qualifies for Shingles Vaccine? {YES/NO:21197}  Zostavax completed {YES/NO:21197}  {Shingrix Completed?:2101804}  Screening Tests Health Maintenance  Topic Date Due   COVID-19 Vaccine (1) Never done   OPHTHALMOLOGY EXAM  Never done   Hepatitis C Screening  Never done   TETANUS/TDAP  Never done   PAP SMEAR-Modifier  Never done   COLONOSCOPY (Pts 45-22yrs Insurance coverage will need to be confirmed)  Never done   INFLUENZA VACCINE  09/08/2021   HEMOGLOBIN A1C  01/23/2022   Diabetic kidney evaluation - GFR measurement  07/25/2022   Diabetic kidney evaluation - Urine ACR  07/25/2022   FOOT EXAM  07/25/2022   MAMMOGRAM  08/08/2023   HIV Screening  Completed   Zoster Vaccines- Shingrix  Completed   HPV VACCINES  Aged Out    Health Maintenance  Health Maintenance Due  Topic Date Due   COVID-19 Vaccine (1) Never done   OPHTHALMOLOGY EXAM  Never done   Hepatitis C Screening  Never done   TETANUS/TDAP  Never done   PAP SMEAR-Modifier  Never done   COLONOSCOPY (Pts 45-23yrs Insurance coverage will need to be confirmed)  Never done   INFLUENZA VACCINE  09/08/2021    {Colorectal cancer screening:2101809}  {Mammogram status:21018020}  {Bone Density status:21018021}  Lung Cancer Screening: (Low Dose CT Chest recommended if Age 68-80 years, 30 pack-year currently smoking OR have quit w/in 15years.) {DOES NOT does:27190::"does not"} qualify.   Lung Cancer Screening Referral: ***  Additional Screening:  Hepatitis C Screening: {DOES NOT does:27190::"does not"} qualify; Completed ***  Vision  Screening: Recommended annual ophthalmology exams for early detection of glaucoma and other disorders of the eye. Is the patient up to date with their annual eye exam?  {YES/NO:21197} Who is the provider or what is the name of the office in which the patient attends annual eye exams? *** If pt is not established with a provider, would they like to be referred to a provider to establish care? {YES/NO:21197}.   Dental Screening: Recommended annual dental exams for proper oral hygiene  Community Resource Referral / Chronic Care Management: CRR required this visit?  {YES/NO:21197}  CCM required this visit?  {YES/NO:21197}     Plan:     I have personally reviewed and noted the following in the patient's chart:   Medical and social history Use of alcohol, tobacco or illicit drugs  Current medications and supplements including opioid prescriptions. {Opioid Prescriptions:[phone]} Functional ability and status Nutritional status Physical activity Advanced directives List of other physicians Hospitalizations, surgeries, and ER visits in previous 12 months Vitals Screenings to include cognitive, depression, and falls Referrals and appointments  In addition, I have reviewed and discussed with patient certain preventive protocols, quality metrics, and best practice recommendations. A written personalized care plan for preventive services as well as general preventive health recommendations were provided to patient.  Karle Plumber, MD   11/27/2021   Nurse Notes: ***

## 2021-11-27 NOTE — Patient Instructions (Signed)
I have given you the packet explaining what an advanced directive is.  Should you decide to execute a living will or healthcare power of attorney, please bring a copy for our records.  I have referred you back to your orthopedic specialist Dr. Erlinda Hong for further evaluation of your left leg and knee pain post fall.  Use a warm compress or heating pad to the left thigh.  I have prescribed a cane for you to use.  We will keep you out of work until you have seen the orthopedic specialist.   I have given you a prescription for shower chair.  Blood pressure is not controlled.  We have added another blood pressure medication called amlodipine 5 mg daily.

## 2021-11-27 NOTE — Progress Notes (Unsigned)
Needs medication refills on all meds.

## 2021-11-28 LAB — HEPATITIS C ANTIBODY: Hep C Virus Ab: NONREACTIVE

## 2021-11-30 DIAGNOSIS — R262 Difficulty in walking, not elsewhere classified: Secondary | ICD-10-CM | POA: Diagnosis not present

## 2021-11-30 DIAGNOSIS — M79669 Pain in unspecified lower leg: Secondary | ICD-10-CM | POA: Diagnosis not present

## 2021-12-07 ENCOUNTER — Telehealth: Payer: Self-pay | Admitting: Internal Medicine

## 2021-12-07 NOTE — Telephone Encounter (Signed)
-----   Message from Ena Dawley sent at 12/07/2021  8:13 AM EDT ----- Regarding: RE: Ortho ASAP with Dr. Josem Kaufmann Morning  Patient scheduled with  Dr Erlinda Hong  on  12/22/2021 @9 :45am   ----- Message ----- From: Ladell Pier, MD Sent: 11/29/2021   7:17 PM EDT To: Ena Dawley Subject: Ortho ASAP with Dr. Erlinda Hong                         Try to get her in ASAP with Dr. Erlinda Hong

## 2021-12-08 ENCOUNTER — Ambulatory Visit: Payer: Medicaid Other | Admitting: Orthopaedic Surgery

## 2021-12-09 ENCOUNTER — Telehealth: Payer: Self-pay | Admitting: Internal Medicine

## 2021-12-09 NOTE — Telephone Encounter (Signed)
I received letter from [place] stating that the letter they received from the indicated that she was seen on 11/27/2021 and that she was being referred to an orthopedic specialist.  They wanted me to submit information indicating the date that I am willing to certify that patient was incapacitated from performing her job duties.  The letter stated that patient was seen in the ER on 11/17/2021 and the healthcare provider there did not certify her off work past 11/18/2021. FMLA form was attached but this letter stated that I do not have to complete the attached FMLA form as the patient indicated she is wanting to follow-up for short-term disability.  The short-term disability forms will be faxed to me after the claim has been created. Fax number given was [phone] or [phone].  Telephone number [phone]. Claim number: [id number]  Letter will be sent giving her incapacitated date as 11/17/2021, the day she was seen in the ER.

## 2021-12-10 NOTE — Telephone Encounter (Signed)
Patient called in states info received to [facility], isnt enough, they need medical release form and info showing she I on medicatin , and has shower chair and taking muscle relaxers, [place] FMLA filled out. They have to have this before 5 days.

## 2021-12-14 NOTE — Telephone Encounter (Signed)
Pt called in to follow up. Advised pt per PCP note below.  Pt says that they are with holding her pay and would like to know if provider could complete sooner?    [phone] - incase of any concern.

## 2021-12-14 NOTE — Telephone Encounter (Signed)
Patient name and DOB verified.  Advised that paperwork is pending Ortho Eval.  Advised patient to call Ortho to see if there was a sooner apt. She states they did not have anything sooner.   Again, advised that paperwork was pending Ortho Eval.

## 2021-12-16 ENCOUNTER — Telehealth: Payer: Self-pay | Admitting: Emergency Medicine

## 2021-12-16 NOTE — Telephone Encounter (Signed)
Copied from CRM [id number]. Topic: General - Other >> Dec 16, 2021 10:45 AM Macon Large wrote: Reason for CRM: Pt requests that her most recent office notes be faxed to [facility] in attention to [place] and include # [phone]. Pt stated we already have the fax number so please fax the office notes asap today

## 2021-12-16 NOTE — Telephone Encounter (Signed)
Last office notes has been faxed to [phone]

## 2021-12-18 ENCOUNTER — Other Ambulatory Visit: Payer: Self-pay | Admitting: Internal Medicine

## 2021-12-18 DIAGNOSIS — G8929 Other chronic pain: Secondary | ICD-10-CM | POA: Diagnosis not present

## 2021-12-18 DIAGNOSIS — M25562 Pain in left knee: Secondary | ICD-10-CM | POA: Diagnosis not present

## 2021-12-18 DIAGNOSIS — M5431 Sciatica, right side: Secondary | ICD-10-CM | POA: Diagnosis not present

## 2021-12-18 DIAGNOSIS — M1710 Unilateral primary osteoarthritis, unspecified knee: Secondary | ICD-10-CM | POA: Diagnosis not present

## 2021-12-18 DIAGNOSIS — M545 Low back pain, unspecified: Secondary | ICD-10-CM | POA: Diagnosis not present

## 2021-12-18 DIAGNOSIS — M25561 Pain in right knee: Secondary | ICD-10-CM | POA: Diagnosis not present

## 2021-12-18 DIAGNOSIS — Z79891 Long term (current) use of opiate analgesic: Secondary | ICD-10-CM | POA: Diagnosis not present

## 2021-12-18 DIAGNOSIS — G894 Chronic pain syndrome: Secondary | ICD-10-CM | POA: Diagnosis not present

## 2021-12-18 NOTE — Telephone Encounter (Signed)
Requested Prescriptions  Pending Prescriptions Disp Refills   albuterol (VENTOLIN HFA) 108 (90 Base) MCG/ACT inhaler [Pharmacy Med Name: ALBUTEROL HFA (PROAIR) INHALER] 25.5 each 0    Sig: INHALE 1-2 PUFFS INTO THE LUNGS DAILY AS NEEDED FOR WHEEZING OR SHORTNESS OF BREATH.     Pulmonology:  Beta Agonists 2 Failed - 12/18/2021  2:40 PM      Failed - Last BP in normal range    BP Readings from Last 1 Encounters:  11/27/21 (!) 141/85         Passed - Last Heart Rate in normal range    Pulse Readings from Last 1 Encounters:  11/27/21 76         Passed - Valid encounter within last 12 months    Recent Outpatient Visits           3 weeks ago Encounter for [employer] annual wellness exam   [facility] [place], Gavin Pound B, MD   4 months ago Type 2 diabetes mellitus with obesity [facility])   [facility] Marcine Matar, MD   1 year ago Essential hypertension   [facility] Marcine Matar, MD   1 year ago Type 2 diabetes mellitus with peripheral neuropathy [facility])   [place] [facility] Drucilla Chalet, RPH-CPP   1 year ago Type 2 diabetes mellitus with peripheral neuropathy [facility])   [facility] Marcine Matar, MD       Future Appointments             In 4 days Tarry Kos, MD [facility]   In 4 weeks Marcine Matar, MD [facility]

## 2021-12-22 ENCOUNTER — Encounter: Payer: Self-pay | Admitting: Orthopaedic Surgery

## 2021-12-22 ENCOUNTER — Telehealth: Payer: Self-pay | Admitting: Emergency Medicine

## 2021-12-22 ENCOUNTER — Ambulatory Visit (INDEPENDENT_AMBULATORY_CARE_PROVIDER_SITE_OTHER): Payer: Medicare Other | Admitting: Orthopaedic Surgery

## 2021-12-22 ENCOUNTER — Other Ambulatory Visit: Payer: Self-pay | Admitting: Internal Medicine

## 2021-12-22 DIAGNOSIS — M25552 Pain in left hip: Secondary | ICD-10-CM

## 2021-12-22 DIAGNOSIS — L309 Dermatitis, unspecified: Secondary | ICD-10-CM

## 2021-12-22 MED ORDER — PREDNISONE 5 MG (21) PO TBPK: ORAL_TABLET | ORAL | 0 refills | Status: DC

## 2021-12-22 NOTE — Progress Notes (Signed)
Office Visit Note   Patient: Laurie Andrews           Date of Birth: 13-May-1970           MRN: [id number] Visit Date: 12/22/2021              Requested by: Marcine Matar, MD [address] [place],  [place] [address] PCP: Marcine Matar, MD   Assessment & Plan: Visit Diagnoses:  1. Acute hip pain, left     Plan: Impression is acute left back and hip pain without improvement from prescription medication.  She seems relatively uncomfortable today.  I would like to start her on a steroid pack and muscle relaxer.  I would also like to obtain an MRI of both the lumbar spine and pelvis to assess for structural abnormalities.  She will follow-up with [place] once these have been completed.  We will continue her out of work until she sees [place] following the MRI.  Call with concerns or questions in the meantime.  Follow-Up Instructions: Return for f/u after mri.   Orders:  Orders Placed This Encounter  Procedures   MR Pelvis w/o contrast   MR Lumbar Spine w/o contrast   Meds ordered this encounter  Medications   predniSONE (STERAPRED UNI-PAK 21 TAB) 5 MG (21) TBPK tablet    Sig: Take as directed    Dispense:  21 tablet    Refill:  0      Procedures: No procedures performed   Clinical Data: No additional findings.   Subjective: Chief Complaint  Patient presents with   Left Hip - Pain    HPI patient is a pleasant 51 year old female who comes in today following an injury to her left lower extremity.  While at work on 11/16/2021 she fell backwards over a pile of boxes that was placed behind her.  She notes that she hit her head and fell onto her left side.  She has had pain to the left lower back and left leg since.  Symptoms have not improved.  She has been out of work since injury as she works at [employer] and there is not a light duty or desk work option.  The patient she is having is to the left lower back and buttock radiating down the leg.  She does have  associated swelling to the leg.  Symptoms are worse when she is standing or sitting for a long time.  She is on chronic oxycodone which does not help.  She does note paresthesias to the left lower extremity.  She has tried a guided home exercise program since the injury without relief.  Of note, she was last seen by [place] and was planning to proceed with left total knee replacement however was unable to proceed due to elevated A1c.  Review of Systems as detailed in HPI.  All others reviewed and are negative.   Objective: Vital Signs: There were no vitals taken for this visit.  Physical Exam well-developed well-nourished female no acute distress but alert and oriented x3.  Ortho Exam lumbar spine exam reveals spinous and paraspinous tenderness throughout all lumbar levels.  Increased pain with lumbar flexion and extension.  Markedly positive straight leg raise.  No focal weakness.  Left hip exam reveals painful logroll and FADIR.  She is neurovascular intact distally.  Specialty Comments:  No specialty comments available.  Imaging: No new imaging   PMFS History: Patient Active Problem List   Diagnosis Date Noted  Hyperlipidemia 07/25/2021   Influenza vaccine needed 05/08/2020   Obesity (BMI 30.0-34.9) 05/08/2020   Moderate persistent asthma without complication 05/08/2020   Post-traumatic osteoarthritis of both knees 05/08/2020   Acute pyelonephritis 04/09/2020   Constipation 04/09/2020   Opioid dependence (HCC) 04/09/2020   Type 2 diabetes mellitus with hyperglycemia (HCC) 04/08/2020   Essential hypertension 04/08/2020   Past Medical History:  Diagnosis Date   Anxiety    Asthma    Diabetes mellitus without complication (HCC)    Hypertension     Family History  Problem Relation Age of Onset   Breast cancer Maternal Aunt     Past Surgical History:  Procedure Laterality Date   BREAST SURGERY     CHOLECYSTECTOMY     HIGH RISK BREAST EXCISION     KNEE SURGERY     Social  History   Occupational History   Not on file  Tobacco Use   Smoking status: Never   Smokeless tobacco: Never  Vaping Use   Vaping Use: Never used  Substance and Sexual Activity   Alcohol use: Not Currently   Drug use: Never   Sexual activity: Not on file

## 2021-12-22 NOTE — Telephone Encounter (Signed)
Copied from CRM [id number]. Topic: General - Inquiry >> Dec 22, 2021  2:35 PM Marlow Baars wrote: Reason for CRM: The patient called in stating she had an appointment today with her orthopedist and is waiting for them to schedule an MRI left side from hip all the way down. She brought in [facility] paperwork and was told to tell her provider when she had her appt with the specilist that she saw today. She needs this filled out as soon as possible. Please assist patient furher

## 2021-12-23 ENCOUNTER — Telehealth: Payer: Self-pay | Admitting: Internal Medicine

## 2021-12-23 ENCOUNTER — Other Ambulatory Visit: Payer: Self-pay | Admitting: Pharmacist

## 2021-12-23 DIAGNOSIS — Z794 Long term (current) use of insulin: Secondary | ICD-10-CM

## 2021-12-23 MED ORDER — FREESTYLE LIBRE 3 SENSOR MISC: 3 refills | Status: DC

## 2021-12-23 NOTE — Telephone Encounter (Signed)
Rx sent 

## 2021-12-23 NOTE — Telephone Encounter (Signed)
Medication Refill - Medication: FreeStyle Libre 3 sensor  Pt is requesting that a FreeStyle Libre 3 sensor be prescribed instead of continuing to prick her finger.   Has the patient contacted their pharmacy? No. (Agent: If no, request that the patient contact the pharmacy for the refill. If patient does not wish to contact the pharmacy document the reason why and proceed with request.) (Agent: If yes, when and what did the pharmacy advise?)  Preferred Pharmacy (with phone number or street name):  [id number] - [place] - [address] AT [address]  [address] [place] [address]  Phone: [phone] Fax: [phone]  Hours: Open 24 hours   Has the patient been seen for an appointment in the last year OR does the patient have an upcoming appointment? Yes.    Agent: Please be advised that RX refills may take up to 3 business days. We ask that you follow-up with your pharmacy.

## 2021-12-24 DIAGNOSIS — R058 Other specified cough: Secondary | ICD-10-CM | POA: Diagnosis not present

## 2021-12-24 DIAGNOSIS — J069 Acute upper respiratory infection, unspecified: Secondary | ICD-10-CM | POA: Diagnosis not present

## 2021-12-24 NOTE — Telephone Encounter (Signed)
All paperwork has been faxed

## 2021-12-28 DIAGNOSIS — R051 Acute cough: Secondary | ICD-10-CM | POA: Diagnosis not present

## 2021-12-28 DIAGNOSIS — Z8709 Personal history of other diseases of the respiratory system: Secondary | ICD-10-CM | POA: Diagnosis not present

## 2021-12-28 DIAGNOSIS — R058 Other specified cough: Secondary | ICD-10-CM | POA: Diagnosis not present

## 2022-01-08 DIAGNOSIS — M79669 Pain in unspecified lower leg: Secondary | ICD-10-CM | POA: Diagnosis not present

## 2022-01-08 DIAGNOSIS — R262 Difficulty in walking, not elsewhere classified: Secondary | ICD-10-CM | POA: Diagnosis not present

## 2022-01-15 ENCOUNTER — Ambulatory Visit: Payer: Medicare Other | Attending: Internal Medicine | Admitting: Internal Medicine

## 2022-01-15 VITALS — BP 140/82 | HR 74 | Temp 97.8°F | Ht 61.0 in | Wt 167.0 lb

## 2022-01-15 DIAGNOSIS — I10 Essential (primary) hypertension: Secondary | ICD-10-CM | POA: Diagnosis not present

## 2022-01-15 DIAGNOSIS — M5431 Sciatica, right side: Secondary | ICD-10-CM | POA: Diagnosis not present

## 2022-01-15 DIAGNOSIS — E1169 Type 2 diabetes mellitus with other specified complication: Secondary | ICD-10-CM | POA: Diagnosis not present

## 2022-01-15 DIAGNOSIS — I152 Hypertension secondary to endocrine disorders: Secondary | ICD-10-CM

## 2022-01-15 DIAGNOSIS — G894 Chronic pain syndrome: Secondary | ICD-10-CM | POA: Diagnosis not present

## 2022-01-15 DIAGNOSIS — M25552 Pain in left hip: Secondary | ICD-10-CM | POA: Insufficient documentation

## 2022-01-15 DIAGNOSIS — Z713 Dietary counseling and surveillance: Secondary | ICD-10-CM | POA: Insufficient documentation

## 2022-01-15 DIAGNOSIS — E1142 Type 2 diabetes mellitus with diabetic polyneuropathy: Secondary | ICD-10-CM | POA: Diagnosis not present

## 2022-01-15 DIAGNOSIS — F1120 Opioid dependence, uncomplicated: Secondary | ICD-10-CM | POA: Diagnosis not present

## 2022-01-15 DIAGNOSIS — M25561 Pain in right knee: Secondary | ICD-10-CM | POA: Diagnosis not present

## 2022-01-15 DIAGNOSIS — E1159 Type 2 diabetes mellitus with other circulatory complications: Secondary | ICD-10-CM

## 2022-01-15 DIAGNOSIS — E1165 Type 2 diabetes mellitus with hyperglycemia: Secondary | ICD-10-CM | POA: Diagnosis not present

## 2022-01-15 DIAGNOSIS — Z794 Long term (current) use of insulin: Secondary | ICD-10-CM | POA: Diagnosis not present

## 2022-01-15 DIAGNOSIS — M25562 Pain in left knee: Secondary | ICD-10-CM | POA: Diagnosis not present

## 2022-01-15 DIAGNOSIS — M545 Low back pain, unspecified: Secondary | ICD-10-CM | POA: Diagnosis not present

## 2022-01-15 DIAGNOSIS — E669 Obesity, unspecified: Secondary | ICD-10-CM | POA: Insufficient documentation

## 2022-01-15 DIAGNOSIS — M1710 Unilateral primary osteoarthritis, unspecified knee: Secondary | ICD-10-CM | POA: Diagnosis not present

## 2022-01-15 DIAGNOSIS — Z6831 Body mass index (BMI) 31.0-31.9, adult: Secondary | ICD-10-CM | POA: Diagnosis not present

## 2022-01-15 DIAGNOSIS — G8929 Other chronic pain: Secondary | ICD-10-CM | POA: Diagnosis not present

## 2022-01-15 DIAGNOSIS — Z79891 Long term (current) use of opiate analgesic: Secondary | ICD-10-CM | POA: Diagnosis not present

## 2022-01-15 LAB — POCT URINALYSIS DIP (CLINITEK)
Bilirubin, UA: NEGATIVE
Glucose, UA: 500 mg/dL — AB
Ketones, POC UA: NEGATIVE mg/dL
Leukocytes, UA: NEGATIVE
Nitrite, UA: NEGATIVE
POC PROTEIN,UA: NEGATIVE
Spec Grav, UA: 1.015 (ref 1.010–1.025)
Urobilinogen, UA: 0.2 E.U./dL
pH, UA: 5.5 (ref 5.0–8.0)

## 2022-01-15 LAB — POCT GLYCOSYLATED HEMOGLOBIN (HGB A1C): HbA1c, POC (controlled diabetic range): 14.8 % — AB (ref 0.0–7.0)

## 2022-01-15 LAB — GLUCOSE, POCT (MANUAL RESULT ENTRY): POC Glucose: 397 mg/dl — AB (ref 70–99)

## 2022-01-15 MED ORDER — FREESTYLE LIBRE 3 READER DEVI: 1.0000 "application " | Freq: Three times a day (TID) | 0 refills | Status: DC

## 2022-01-15 MED ORDER — INSULIN LISPRO (1 UNIT DIAL) 100 UNIT/ML (KWIKPEN): 8.0000 [IU] | PEN_INJECTOR | Freq: Three times a day (TID) | SUBCUTANEOUS | 1 refills | Status: DC

## 2022-01-15 MED ORDER — LANTUS SOLOSTAR 100 UNIT/ML ~~LOC~~ SOPN: 20.0000 [IU] | PEN_INJECTOR | Freq: Every day | SUBCUTANEOUS | 11 refills | Status: DC

## 2022-01-15 MED ORDER — AMLODIPINE BESYLATE 10 MG PO TABS: 10.0000 mg | ORAL_TABLET | Freq: Every day | ORAL | 6 refills | Status: DC

## 2022-01-15 NOTE — Progress Notes (Signed)
Patient ID: Laurie Andrews, female    DOB: 1970/10/29  MRN: [id number]  CC: Diabetes (DM f/u. Med refill./Would like to have the [employer]. )   Subjective: Laurie Andrews is a 51 y.o. female who presents for chronic ds management Her concerns today include:  Patient with history of DM type II with peripheral neuropathy, HTN, opiate dependence, anxiety disorder, asthma, migraines, OA knees, alopecia.     Patient does not have medications with her.  She arrived late to her appt but was worked in.  DIABETES TYPE 2 Last A1C:   Results for orders placed or performed in visit on 01/15/22  POCT glucose (manual entry)  Result Value Ref Range   POC Glucose 397 (A) 70 - 99 mg/dl  POCT glycosylated hemoglobin (Hb A1C)  Result Value Ref Range   Hemoglobin A1C     HbA1c POC (<> result, manual entry)     HbA1c, POC (prediabetic range)     HbA1c, POC (controlled diabetic range) 14.8 (A) 0.0 - 7.0 %  POCT URINALYSIS DIP (CLINITEK)  Result Value Ref Range   Color, UA yellow yellow   Clarity, UA clear clear   Glucose, UA =500 (A) negative mg/dL   Bilirubin, UA negative negative   Ketones, POC UA negative negative mg/dL   Spec Grav, UA 8.295 6.213 - 1.025   Blood, UA trace-lysed (A) negative   pH, UA 5.5 5.0 - 8.0   POC PROTEIN,UA negative negative, trace   Urobilinogen, UA 0.2 0.2 or 1.0 E.U./dL   Nitrite, UA Negative Negative   Leukocytes, UA Negative Negative  Patient reports compliance with taking Humalog 5 units with meals and glargine insulin 15 units daily.  A1c has not significantly improved compared to when it was checked 5 months ago and level was greater than 15. Med Adherence:  [x]  Yes    []  No Medication side effects:  []  Yes    [x]  No Home Monitoring?  [x]  Yes 3 times a day.  She does not have a log with her.   Home glucose results range: Reports blood sugar this morning was 392 and most of the times blood sugars have been in the 280s. Diet Adherence: [x]  Yes    []   No Exercise: []  Yes    [x]  No Hypoglycemic episodes?: []  Yes    [x]  No  Pressure noted to be elevated today.  On last visit we had added amlodipine 5 mg daily she reports compliance with taking this and Cozaar 100 mg daily.  No device to check blood pressure.  She limits salt in the foods.  HL: Reports compliance with taking atorvastatin.  Complained of pain and swelling in the left leg and feeling like the left knee wants to give out after she had fallen at work.  She was assessed to have probable bone contusion of the left buttock and thigh and worsening of osteoarthritis pain in the knee.  Referred to orthopedics Dr. Roda Shutters.  She was seen by him on 12/22/2021.  His impression was acute left back and hip pain.  He gave her steroid pack and muscle relaxant.  Ordered MRI of the pelvis and lumbar spine.  Scheduled to have these done on the 10th of this month and has follow-up with him several days after.  Quired today about why she is still having pain in the left hip. Still on Oxycodone through [facility].  I wrote rxn for Narcan for her on last visit   I completed  short-term disability form keeping her out of work until the end of this month. Patient Active Problem List   Diagnosis Date Noted   Hyperlipidemia 07/25/2021   Influenza vaccine needed 05/08/2020   Obesity (BMI 30.0-34.9) 05/08/2020   Moderate persistent asthma without complication 05/08/2020   Post-traumatic osteoarthritis of both knees 05/08/2020   Acute pyelonephritis 04/09/2020   Constipation 04/09/2020   Opioid dependence (HCC) 04/09/2020   Type 2 diabetes mellitus with hyperglycemia (HCC) 04/08/2020   Essential hypertension 04/08/2020     Current Outpatient Medications on File Prior to Visit  Medication Sig Dispense Refill   Accu-Chek Softclix Lancets lancets Use as instructed 100 each 12   ADVAIR DISKUS 100-50 MCG/ACT AEPB INHALE 1 PUFF INTO THE LUNGS TWICE A DAY 1 each 3   albuterol (PROVENTIL) (2.5 MG/3ML) 0.083%  nebulizer solution INHALE 3 ML BY NEBULIZATION EVERY 6 HOURS AS NEEDED FOR WHEEZING OR SHORTNESS OF BREATH 150 mL 0   albuterol (VENTOLIN HFA) 108 (90 Base) MCG/ACT inhaler INHALE 1-2 PUFFS INTO THE LUNGS DAILY AS NEEDED FOR WHEEZING OR SHORTNESS OF BREATH. 25.5 each 0   amitriptyline (ELAVIL) 100 MG tablet Take 100 mg by mouth as needed for sleep.     amLODipine (NORVASC) 5 MG tablet Take 1 tablet (5 mg total) by mouth daily. 90 tablet 2   atorvastatin (LIPITOR) 10 MG tablet Take 1 tablet (10 mg total) by mouth daily. 90 tablet 1   blood glucose meter kit and supplies KIT Dispense based on patient and insurance preference. Use up to four times daily as directed. (FOR ICD-9 250.00, 250.01). 1 each 0   blood glucose meter kit and supplies KIT Dispense based on patient and insurance preference. Use up to four times daily as directed. 1 each 0   Blood Glucose Monitoring Suppl (ACCU-CHEK GUIDE) w/Device KIT UAD 1 kit 0   cetirizine (ZYRTEC) 10 MG tablet Take 10 mg by mouth daily.     Continuous Blood Gluc Sensor (FREESTYLE LIBRE 3 SENSOR) MISC Use to check blood sugar TID. E11.65 2 each 3   diclofenac Sodium (VOLTAREN) 1 % GEL Apply 2 g topically 4 (four) times daily. 50 g 0   GAVILAX 17 GM/SCOOP powder Take 17 g by mouth daily as needed for mild constipation.     glucose blood (ACCU-CHEK GUIDE) test strip Use as instructed 100 each 12   insulin glargine (LANTUS SOLOSTAR) 100 UNIT/ML Solostar Pen Inject 15 Units into the skin at bedtime. 15 mL 11   insulin lispro (HUMALOG KWIKPEN) 100 UNIT/ML KwikPen Inject 5 Units into the skin 3 (three) times daily. Take with meals 15 mL 1   Insulin Pen Needle (PEN NEEDLES) 31G X 8 MM MISC UAD 100 each 6   losartan (COZAAR) 100 MG tablet Take 1 tablet (100 mg total) by mouth daily. 30 tablet 6   montelukast (SINGULAIR) 10 MG tablet Take 10 mg by mouth daily.     naloxone (NARCAN) nasal spray 4 mg/0.1 mL SMARTSIG:Both Nares 1 each 1   naratriptan (AMERGE) 2.5 MG  tablet Take 2.5 mg by mouth as needed for migraine.     nystatin cream (MYCOSTATIN) Apply 1 application topically 3 (three) times daily as needed for dry skin.     oxyCODONE (OXY IR/ROXICODONE) 5 MG immediate release tablet Take 5 mg by mouth in the morning, at noon, and at bedtime.     pantoprazole (PROTONIX) 40 MG tablet Take 40 mg by mouth 2 (two) times daily.  triamcinolone cream (KENALOG) 0.1 % APPLY TO AFFECTED AREA TWICE A DAY 30 g 0   methocarbamol (ROBAXIN) 500 MG tablet Take 1 tablet (500 mg total) by mouth 2 (two) times daily. (Patient not taking: Reported on 01/15/2022) 20 tablet 0   ondansetron (ZOFRAN ODT) 4 MG disintegrating tablet Take 1 tablet (4 mg total) by mouth every 8 (eight) hours as needed for nausea or vomiting. (Patient not taking: Reported on 01/15/2022) 20 tablet 1   predniSONE (STERAPRED UNI-PAK 21 TAB) 5 MG (21) TBPK tablet Take as directed (Patient not taking: Reported on 01/15/2022) 21 tablet 0   senna-docusate (SENOKOT-S) 8.6-50 MG tablet Take 1 tablet by mouth 2 (two) times daily. (Patient not taking: Reported on 01/15/2022)     topiramate (TOPAMAX) 25 MG tablet Take 50 mg by mouth 2 (two) times daily. (Patient not taking: Reported on 01/15/2022)     No current facility-administered medications on file prior to visit.    Allergies  Allergen Reactions   Keflex [Cephalexin] Nausea And Vomiting   Metformin And Related    Trulicity [Dulaglutide] Nausea And Vomiting   Aspirin Nausea And Vomiting and Rash   Morphine And Related Itching and Rash   Penicillins Nausea And Vomiting and Rash    Social History   Socioeconomic History   Marital status: Single    Spouse name: Not on file   Number of children: Not on file   Years of education: Not on file   Highest education level: Not on file  Occupational History   Not on file  Tobacco Use   Smoking status: Never   Smokeless tobacco: Never  Vaping Use   Vaping Use: Never used  Substance and Sexual Activity    Alcohol use: Not Currently   Drug use: Never   Sexual activity: Not on file  Other Topics Concern   Not on file  Social History Narrative   Not on file   Social Determinants of Health   Financial Resource Strain: Low Risk  (11/27/2021)   Overall Financial Resource Strain (CARDIA)    Difficulty of Paying Living Expenses: Not very hard  Food Insecurity: No Food Insecurity (11/27/2021)   Hunger Vital Sign    Worried About Running Out of Food in the Last Year: Never true    Ran Out of Food in the Last Year: Never true  Transportation Needs: No Transportation Needs (11/27/2021)   PRAPARE - Administrator, Civil Service (Medical): No    Lack of Transportation (Non-Medical): No  Physical Activity: Insufficiently Active (11/27/2021)   Exercise Vital Sign    Days of Exercise per Week: 7 days    Minutes of Exercise per Session: 10 min  Stress: No Stress Concern Present (11/27/2021)   [employer] of Occupational Health - Occupational Stress Questionnaire    Feeling of Stress : Not at all  Social Connections: Socially Isolated (11/27/2021)   Social Connection and Isolation Panel [NHANES]    Frequency of Communication with Friends and Family: Three times a week    Frequency of Social Gatherings with Friends and Family: Never    Attends Religious Services: Never    Database administrator or Organizations: No    Attends Banker Meetings: Never    Marital Status: Never married  Intimate Partner Violence: Not At Risk (11/27/2021)   Humiliation, Afraid, Rape, and Kick questionnaire    Fear of Current or Ex-Partner: No    Emotionally Abused: No    Physically Abused:  No    Sexually Abused: No    Family History  Problem Relation Age of Onset   Breast cancer Maternal Aunt     Past Surgical History:  Procedure Laterality Date   BREAST SURGERY     CHOLECYSTECTOMY     HIGH RISK BREAST EXCISION     KNEE SURGERY      ROS: Review of Systems Negative except  as stated above  PHYSICAL EXAM: BP (!) 140/82 (BP Location: Left Arm, Patient Position: Sitting, Cuff Size: Normal)   Pulse 74   Temp 97.8 F (36.6 C) (Oral)   Ht 5\' 1"  (1.549 m)   Wt 167 lb (75.8 kg)   SpO2 100%   BMI 31.55 kg/m   Physical Exam BP 145/83 General appearance - alert, well appearing, and in no distress Mental status -poor historian but answers most questions as best she could Chest - clear to auscultation, no wheezes, rales or rhonchi, symmetric air entry Heart - normal rate, regular rhythm, normal S1, S2, no murmurs, rubs, clicks or gallops Extremities -no edema noted today of the thighs and lower legs. MSK: She is noted to be hopping favoring her left leg.  However I do note that she does not have a 4-prong cane with her.  I inquired about this.  She told me that she left it in the car.     Latest Ref Rng & Units 07/24/2021    3:01 PM 04/09/2020    4:28 AM 04/08/2020    6:18 AM  CMP  Glucose 70 - 99 mg/dL 284  132  440   BUN 6 - 24 mg/dL 11  6  9    Creatinine 0.57 - 1.00 mg/dL 1.02  7.25  3.66   Sodium 134 - 144 mmol/L 134  136  137   Potassium 3.5 - 5.2 mmol/L 4.7  3.9  3.8   Chloride 96 - 106 mmol/L 97  104  105   CO2 20 - 29 mmol/L 24  20  22    Calcium 8.7 - 10.2 mg/dL 9.8  9.3  8.5   Total Protein 6.0 - 8.5 g/dL 7.7   5.9   Total Bilirubin 0.0 - 1.2 mg/dL 0.6   1.2   Alkaline Phos 44 - 121 IU/L 188   108   AST 0 - 40 IU/L 15   17   ALT 0 - 32 IU/L 26   18    Lipid Panel     Component Value Date/Time   CHOL 177 07/24/2021 1501   TRIG 259 (H) 07/24/2021 1501   HDL 46 07/24/2021 1501   CHOLHDL 3.8 07/24/2021 1501   LDLCALC 88 07/24/2021 1501    CBC    Component Value Date/Time   WBC 5.0 07/24/2021 1501   WBC 6.2 04/09/2020 0428   RBC 5.41 (H) 07/24/2021 1501   RBC 5.23 (H) 04/09/2020 0428   HGB 13.7 07/24/2021 1501   HCT 42.6 07/24/2021 1501   PLT 356 07/24/2021 1501   MCV 79 07/24/2021 1501   MCH 25.3 (L) 07/24/2021 1501   MCH 25.6 (L)  04/09/2020 0428   MCHC 32.2 07/24/2021 1501   MCHC 31.3 04/09/2020 0428   RDW 12.0 07/24/2021 1501   LYMPHSABS 2.4 04/09/2020 0428   MONOABS 0.5 04/09/2020 0428   EOSABS 0.1 04/09/2020 0428   BASOSABS 0.0 04/09/2020 0428    ASSESSMENT AND PLAN:  1. Type 2 diabetes mellitus with hyperglycemia, with long-term current use of insulin (HCC) Not at goal. I  recommend increase glargine insulin to 20 units at bedtime and Humalog to 8 units with meals. She has the [place] device.  She was seen by our clinical pharmacist today who showed her how to apply it and download the app. Patient advised to eliminate sugary drinks from the diet, cut back on portion sizes especially of white carbohydrates, eat more white lean meat like chicken [place] and seafood instead of beef or pork and incorporate fresh fruits and vegetables into the diet daily. Follow-up with clinical pharmacist in about 3 weeks for recheck - POCT glucose (manual entry) - POCT glycosylated hemoglobin (Hb A1C) - POCT URINALYSIS DIP (CLINITEK)  2. Type 2 diabetes mellitus with obesity (HCC) See #1 above - insulin glargine (LANTUS SOLOSTAR) 100 UNIT/ML Solostar Pen; Inject 20 Units into the skin at bedtime.  Dispense: 15 mL; Refill: 11 - insulin lispro (HUMALOG KWIKPEN) 100 UNIT/ML KwikPen; Inject 8 Units into the skin 3 (three) times daily. Take with meals  Dispense: 15 mL; Refill: 1  3. Hypertension associated with diabetes (HCC) Not at goal.  Increase amlodipine to 10 mg daily.  Continue Cozaar 100 mg daily. - amLODipine (NORVASC) 10 MG tablet; Take 1 tablet (10 mg total) by mouth daily.  Dispense: 30 tablet; Refill: 6  4. Hip pain, left Patient to keep upcoming appointment for MRIs and appointment with Dr. Roda Shutters. Informed that I completed disability short-term keeping her out of work until the end of December.  She will need to see what the orthopedics recommends an upcoming visit to see if she will be released to return to work based on  the results of MRI. Encouraged her to use the 4-prong cane that she requested on last visit and did pick up.    Patient was given the opportunity to ask questions.  Patient verbalized understanding of the plan and was able to repeat key elements of the plan.   This documentation was completed using Paediatric nurse.  Any transcriptional errors are unintentional.  Orders Placed This Encounter  Procedures   POCT glucose (manual entry)   POCT glycosylated hemoglobin (Hb A1C)   POCT URINALYSIS DIP (CLINITEK)     Requested Prescriptions   Signed Prescriptions Disp Refills   Continuous Blood Gluc Receiver (FREESTYLE LIBRE 3 READER) DEVI 1 each 0    Sig: 1 application  by Does not apply route in the morning, at noon, and at bedtime. Use to check blood sugar TID. E11.65    No follow-ups on file.  Jonah Blue, MD, FACP

## 2022-01-15 NOTE — Patient Instructions (Signed)
Your blood pressure is not at goal.  I recommend increasing amlodipine to 10 mg daily. Your blood sugars are not at goal.  The goal for blood sugars before meals is 90-130.  Your continuous glucose monitor will help you in determining how foods affect your blood sugars. Increased glargine insulin to 20 units at bedtime. Increase Humalog insulin to 8 units with meals. We have referred you to a nutritionist and an endocrinologist.

## 2022-01-17 ENCOUNTER — Ambulatory Visit
Admission: RE | Admit: 2022-01-17 | Discharge: 2022-01-17 | Disposition: A | Discharge status: Home / Self Care | Payer: Medicaid Other | Source: Ambulatory Visit | Attending: Orthopaedic Surgery | Admitting: Orthopaedic Surgery

## 2022-01-17 DIAGNOSIS — M47817 Spondylosis without myelopathy or radiculopathy, lumbosacral region: Secondary | ICD-10-CM | POA: Diagnosis not present

## 2022-01-17 DIAGNOSIS — M4697 Unspecified inflammatory spondylopathy, lumbosacral region: Secondary | ICD-10-CM | POA: Diagnosis not present

## 2022-01-17 DIAGNOSIS — M25552 Pain in left hip: Secondary | ICD-10-CM

## 2022-01-17 DIAGNOSIS — M5127 Other intervertebral disc displacement, lumbosacral region: Secondary | ICD-10-CM | POA: Diagnosis not present

## 2022-01-17 DIAGNOSIS — M160 Bilateral primary osteoarthritis of hip: Secondary | ICD-10-CM | POA: Diagnosis not present

## 2022-01-17 DIAGNOSIS — M545 Low back pain, unspecified: Secondary | ICD-10-CM | POA: Diagnosis not present

## 2022-01-22 ENCOUNTER — Encounter: Payer: Self-pay | Admitting: Orthopaedic Surgery

## 2022-01-22 ENCOUNTER — Telehealth: Payer: Self-pay | Admitting: Internal Medicine

## 2022-01-22 ENCOUNTER — Ambulatory Visit (INDEPENDENT_AMBULATORY_CARE_PROVIDER_SITE_OTHER): Payer: Medicare Other | Admitting: Orthopaedic Surgery

## 2022-01-22 DIAGNOSIS — M79605 Pain in left leg: Secondary | ICD-10-CM | POA: Diagnosis not present

## 2022-01-22 NOTE — Progress Notes (Unsigned)
Office Visit Note   Patient: Laurie Andrews           Date of Birth: August 08, 1970           MRN: [id number] Visit Date: 01/22/2022              Requested by: Marcine Matar, MD [address] [place],  [place] [address] PCP: Marcine Matar, MD   Assessment & Plan: Visit Diagnoses:  1. Pain in left leg     Plan: Impression is chronic left lower back and leg pain and weakness with underlying left knee arthritis.  Recent MRI of the pelvis and lumbar spine is not consistent with her current symptoms and pain appears to be out of proportion.  At this point, I recommended a course of physical therapy to see if that will alleviate any of her symptoms.  She would like to remain out of work in the meantime.  She has disability paperwork that she will turn into the front.  I have agreed to keep her out for another 6 weeks.  She will follow-up at that time we will likely release her back to work based on recent MRI findings.  Follow-Up Instructions: Return in about 6 weeks (around 03/05/2022).   Orders:  No orders of the defined types were placed in this encounter.  No orders of the defined types were placed in this encounter.     Procedures: No procedures performed   Clinical Data: No additional findings.   Subjective: Chief Complaint  Patient presents with   Lower Back - Follow-up    MRI review    HPI patient is a 51 year old female who comes in today to review MRI results of her lumbar spine and pelvis.  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  She is not being filed under work comp.  She does have a history of left knee DJD but is unable to proceed with left total knee replacement due to elevated A1c.  She has been complaining of pain from the left lower back and buttock down the back of the left leg since this injury.  She feels weakness in the left leg.  She has worked on a home exercise  program and is taken steroids and muscle relaxers as well as chronic oxycodone all without relief.  She tells me she is allergic to NSAIDs.  Recent MRI of the pelvis shows mild bilateral hip arthritis.  Lumbar spine MRI shows mild disc bulge at L5-S1.  ROS negative except HPI  Gen exam: stable   Objective: Vital Signs: There were no vitals taken for this visit.    Ortho Exam unchanged back and hip exam  Specialty Comments:  No specialty comments available.  Imaging: No new imaging   PMFS History: Patient Active Problem List   Diagnosis Date Noted   Hyperlipidemia 07/25/2021   Influenza vaccine needed 05/08/2020   Obesity (BMI 30.0-34.9) 05/08/2020   Moderate persistent asthma without complication 05/08/2020   Post-traumatic osteoarthritis of both knees 05/08/2020   Acute pyelonephritis 04/09/2020   Constipation 04/09/2020   Type 2 diabetes mellitus with hyperglycemia (HCC) 04/08/2020   Essential hypertension 04/08/2020   Past Medical History:  Diagnosis Date   Anxiety    Asthma    Diabetes mellitus without complication (HCC)    Hypertension     Family History  Problem Relation Age of Onset   Breast cancer  Maternal Aunt     Past Surgical History:  Procedure Laterality Date   BREAST SURGERY     CHOLECYSTECTOMY     HIGH RISK BREAST EXCISION     KNEE SURGERY     Social History   Occupational History   Not on file  Tobacco Use   Smoking status: Never   Smokeless tobacco: Never  Vaping Use   Vaping Use: Never used  Substance and Sexual Activity   Alcohol use: Not Currently   Drug use: Never   Sexual activity: Not on file

## 2022-01-22 NOTE — Telephone Encounter (Signed)
Copied from CRM [id number]. Topic: General - Other >> Jan 22, 2022 11:05 AM Esperanza Sheets wrote: Pt states ortho care has took her out of work for an additional 6 wks  Pt inquiring if pcp can complete another short term disability form for the 6 weeks   Pt requesting a cb to discuss further

## 2022-01-25 NOTE — Telephone Encounter (Signed)
Called & spoke to patient. Verified name & DOB. Informed of message below. Patient expressed understanding.

## 2022-01-27 ENCOUNTER — Encounter: Payer: Self-pay | Admitting: Internal Medicine

## 2022-01-27 ENCOUNTER — Telehealth: Payer: Self-pay | Admitting: Internal Medicine

## 2022-01-27 NOTE — Telephone Encounter (Signed)
Letter was placed on desk. Awaiting signature

## 2022-01-30 ENCOUNTER — Other Ambulatory Visit: Payer: Self-pay | Admitting: Internal Medicine

## 2022-01-30 ENCOUNTER — Other Ambulatory Visit: Payer: Self-pay | Admitting: Family Medicine

## 2022-01-30 ENCOUNTER — Other Ambulatory Visit: Payer: Self-pay | Admitting: Physician Assistant

## 2022-01-30 DIAGNOSIS — J4540 Moderate persistent asthma, uncomplicated: Secondary | ICD-10-CM

## 2022-01-30 DIAGNOSIS — L309 Dermatitis, unspecified: Secondary | ICD-10-CM

## 2022-02-02 NOTE — Telephone Encounter (Signed)
Documentation was signed & successfully faxed on 02/02/2022.

## 2022-02-09 ENCOUNTER — Telehealth: Payer: Self-pay | Admitting: Internal Medicine

## 2022-02-09 ENCOUNTER — Ambulatory Visit: Payer: Self-pay | Admitting: *Deleted

## 2022-02-09 NOTE — Telephone Encounter (Signed)
Spoke with patient she was offered earlier appt. But wishes to keep schedule appointments.

## 2022-02-09 NOTE — Telephone Encounter (Signed)
  Chief Complaint: elevated A1c  Disposition: [] ED /[] Urgent Care (no appt availability in office) / [] Appointment(In office/virtual)/ []  [facility]/ [] Home Care/ [] Refused Recommended Disposition /[]  Mobile Bus/ [x]  Follow-up with PCP Additional Notes: Call to patient- unable to use continuous monitor- needs assistance from pharmacy- please reach out to patient.

## 2022-02-09 NOTE — Telephone Encounter (Signed)
Summary: A1C report   Called to report A1C of greater than 13 at todays home visit / please advise if needed         Call to patient: Patient is having a problem with her continuous glucose monitor- she states she needs help with the reader. Patient is not checking her glucose with fingerstick- she states she got rid of all that. Patient does report she is taking the increased dosing on her insulin as directed by provider.  Patient advise I would let pharmacist know she is having problems with the programing on the reader.  (Left message with [facility]- patient has been contacted regarding her most recent readings. Reason for Disposition  [1] Follow-up call to recent contact AND [2] information only call, no triage required  Answer Assessment - Initial Assessment Questions 1. REASON FOR CALL or QUESTION: "What is your reason for calling today?" or "How can I best help you?" or "What question do you have that I can help answer?"     Home Health called with A1c level- consistant with patient's last in office labs. Call to patient- she states she is not able to monitor her glucose levels due to lack of knowledge about reader. Will send message to office for pharmacy follow up. Patient does report that she is taking her increased dosing of insulin as directed.  Protocols used: Information Only Call - No Triage-A-AH

## 2022-02-09 NOTE — Telephone Encounter (Signed)
Spoke with patient had an appt. With pharmacist on 02/23/22 and PCP 02/08. Patient offered earlier appointment , but refused. Patient educationed on food high in [place]

## 2022-02-09 NOTE — Telephone Encounter (Signed)
Diane with [facility] wanted to advised that at the visit Pt had A1c today of Greater than 13 / she wanted to let Dr. Wynetta Emery know asap

## 2022-02-12 DIAGNOSIS — G8929 Other chronic pain: Secondary | ICD-10-CM | POA: Diagnosis not present

## 2022-02-12 DIAGNOSIS — M1710 Unilateral primary osteoarthritis, unspecified knee: Secondary | ICD-10-CM | POA: Diagnosis not present

## 2022-02-12 DIAGNOSIS — M25561 Pain in right knee: Secondary | ICD-10-CM | POA: Diagnosis not present

## 2022-02-12 DIAGNOSIS — Z79891 Long term (current) use of opiate analgesic: Secondary | ICD-10-CM | POA: Diagnosis not present

## 2022-02-12 DIAGNOSIS — M25562 Pain in left knee: Secondary | ICD-10-CM | POA: Diagnosis not present

## 2022-02-12 DIAGNOSIS — G894 Chronic pain syndrome: Secondary | ICD-10-CM | POA: Diagnosis not present

## 2022-02-12 DIAGNOSIS — M545 Low back pain, unspecified: Secondary | ICD-10-CM | POA: Diagnosis not present

## 2022-02-12 DIAGNOSIS — M5431 Sciatica, right side: Secondary | ICD-10-CM | POA: Diagnosis not present

## 2022-02-15 ENCOUNTER — Ambulatory Visit: Payer: Self-pay | Admitting: *Deleted

## 2022-02-15 ENCOUNTER — Other Ambulatory Visit: Payer: Self-pay | Admitting: Internal Medicine

## 2022-02-15 ENCOUNTER — Telehealth: Payer: 59 | Admitting: Physician Assistant

## 2022-02-15 DIAGNOSIS — J069 Acute upper respiratory infection, unspecified: Secondary | ICD-10-CM | POA: Diagnosis not present

## 2022-02-15 DIAGNOSIS — J4540 Moderate persistent asthma, uncomplicated: Secondary | ICD-10-CM

## 2022-02-15 DIAGNOSIS — B9689 Other specified bacterial agents as the cause of diseases classified elsewhere: Secondary | ICD-10-CM

## 2022-02-15 MED ORDER — BENZONATATE 100 MG PO CAPS: 100.0000 mg | ORAL_CAPSULE | Freq: Three times a day (TID) | ORAL | 0 refills | Status: DC | PRN

## 2022-02-15 MED ORDER — PROMETHAZINE-DM 6.25-15 MG/5ML PO SYRP: 5.0000 mL | ORAL_SOLUTION | Freq: Four times a day (QID) | ORAL | 0 refills | Status: DC | PRN

## 2022-02-15 MED ORDER — DOXYCYCLINE HYCLATE 100 MG PO TABS: 100.0000 mg | ORAL_TABLET | Freq: Two times a day (BID) | ORAL | 0 refills | Status: DC

## 2022-02-15 NOTE — Patient Instructions (Signed)
Erskine Speed, thank you for joining Mar Daring, PA-C for today's virtual visit.  While this provider is not your primary care provider (PCP), if your PCP is located in our provider database this encounter information will be shared with them immediately following your visit.   [place] account gives you access to today's visit and all your visits, tests, and labs performed at [facility] " click here if you don't have a [facility] account or go to [email or web address]  Consent: (Patient) Laurie Andrews provided verbal consent for this virtual visit at the beginning of the encounter.  Current Medications:  Current Outpatient Medications:    benzonatate (TESSALON) 100 MG capsule, Take 1 capsule (100 mg total) by mouth 3 (three) times daily as needed., Disp: 30 capsule, Rfl: 0   doxycycline (VIBRA-TABS) 100 MG tablet, Take 1 tablet (100 mg total) by mouth 2 (two) times daily., Disp: 20 tablet, Rfl: 0   promethazine-dextromethorphan (PROMETHAZINE-DM) 6.25-15 MG/5ML syrup, Take 5 mLs by mouth 4 (four) times daily as needed., Disp: 118 mL, Rfl: 0   Accu-Chek Softclix Lancets lancets, Use as instructed, Disp: 100 each, Rfl: 12   ADVAIR DISKUS 100-50 MCG/ACT AEPB, INHALE 1 PUFF INTO THE LUNGS TWICE A DAY, Disp: 1 each, Rfl: 3   albuterol (PROVENTIL) (2.5 MG/3ML) 0.083% nebulizer solution, INHALE 3 ML BY NEBULIZATION EVERY 6 HOURS AS NEEDED FOR WHEEZING OR SHORTNESS OF BREATH, Disp: 150 mL, Rfl: 0   albuterol (VENTOLIN HFA) 108 (90 Base) MCG/ACT inhaler, INHALE 1-2 PUFFS INTO THE LUNGS DAILY AS NEEDED FOR WHEEZING OR SHORTNESS OF BREATH., Disp: 25.5 each, Rfl: 0   amitriptyline (ELAVIL) 100 MG tablet, Take 100 mg by mouth as needed for sleep., Disp: , Rfl:    amLODipine (NORVASC) 10 MG tablet, Take 1 tablet (10 mg total) by mouth daily., Disp: 30 tablet, Rfl: 6   atorvastatin (LIPITOR) 10 MG tablet, TAKE 1 TABLET BY MOUTH EVERY DAY, Disp: 90 tablet,  Rfl: 1   blood glucose meter kit and supplies KIT, Dispense based on patient and insurance preference. Use up to four times daily as directed. (FOR ICD-9 250.00, 250.01)., Disp: 1 each, Rfl: 0   blood glucose meter kit and supplies KIT, Dispense based on patient and insurance preference. Use up to four times daily as directed., Disp: 1 each, Rfl: 0   Blood Glucose Monitoring Suppl (ACCU-CHEK GUIDE) w/Device KIT, UAD, Disp: 1 kit, Rfl: 0   cetirizine (ZYRTEC) 10 MG tablet, Take 10 mg by mouth daily., Disp: , Rfl:    Continuous Blood Gluc Receiver (FREESTYLE LIBRE 3 READER) DEVI, 1 application  by Does not apply route in the morning, at noon, and at bedtime. Use to check blood sugar TID. E11.65, Disp: 1 each, Rfl: 0   Continuous Blood Gluc Sensor (FREESTYLE LIBRE 3 SENSOR) MISC, Use to check blood sugar TID. E11.65, Disp: 2 each, Rfl: 3   diclofenac Sodium (VOLTAREN) 1 % GEL, Apply 2 g topically 4 (four) times daily., Disp: 50 g, Rfl: 0   GAVILAX 17 GM/SCOOP powder, Take 17 g by mouth daily as needed for mild constipation., Disp: , Rfl:    glucose blood (ACCU-CHEK GUIDE) test strip, Use as instructed, Disp: 100 each, Rfl: 12   insulin glargine (LANTUS SOLOSTAR) 100 UNIT/ML Solostar Pen, Inject 20 Units into the skin at bedtime., Disp: 15 mL, Rfl: 11   insulin lispro (HUMALOG KWIKPEN) 100 UNIT/ML KwikPen, Inject 8 Units into the skin 3 (three) times daily. Take with  meals, Disp: 15 mL, Rfl: 1   Insulin Pen Needle (PEN NEEDLES) 31G X 8 MM MISC, UAD, Disp: 100 each, Rfl: 6   losartan (COZAAR) 100 MG tablet, TAKE 1 TABLET BY MOUTH EVERY DAY, Disp: 90 tablet, Rfl: 2   methocarbamol (ROBAXIN) 500 MG tablet, Take 1 tablet (500 mg total) by mouth 2 (two) times daily., Disp: 20 tablet, Rfl: 0   montelukast (SINGULAIR) 10 MG tablet, Take 10 mg by mouth daily., Disp: , Rfl:    naloxone (NARCAN) nasal spray 4 mg/0.1 mL, SMARTSIG:Both Nares, Disp: 1 each, Rfl: 1   naratriptan (AMERGE) 2.5 MG tablet, Take 2.5 mg by  mouth as needed for migraine., Disp: , Rfl:    nystatin cream (MYCOSTATIN), Apply 1 application topically 3 (three) times daily as needed for dry skin., Disp: , Rfl:    ondansetron (ZOFRAN ODT) 4 MG disintegrating tablet, Take 1 tablet (4 mg total) by mouth every 8 (eight) hours as needed for nausea or vomiting., Disp: 20 tablet, Rfl: 1   oxyCODONE (OXY IR/ROXICODONE) 5 MG immediate release tablet, Take 5 mg by mouth in the morning, at noon, and at bedtime., Disp: , Rfl:    pantoprazole (PROTONIX) 40 MG tablet, Take 40 mg by mouth 2 (two) times daily., Disp: , Rfl:    predniSONE (STERAPRED UNI-PAK 21 TAB) 5 MG (21) TBPK tablet, Take as directed, Disp: 21 tablet, Rfl: 0   senna-docusate (SENOKOT-S) 8.6-50 MG tablet, Take 1 tablet by mouth 2 (two) times daily., Disp: , Rfl:    topiramate (TOPAMAX) 25 MG tablet, Take 50 mg by mouth 2 (two) times daily., Disp: , Rfl:    triamcinolone cream (KENALOG) 0.1 %, APPLY TO AFFECTED AREA TWICE A DAY, Disp: 30 g, Rfl: 0   Medications ordered in this encounter:  Meds ordered this encounter  Medications   doxycycline (VIBRA-TABS) 100 MG tablet    Sig: Take 1 tablet (100 mg total) by mouth 2 (two) times daily.    Dispense:  20 tablet    Refill:  0    Order Specific Question:   Supervising Provider    Answer:   Merrilee Jansky [id number]   promethazine-dextromethorphan (PROMETHAZINE-DM) 6.25-15 MG/5ML syrup    Sig: Take 5 mLs by mouth 4 (four) times daily as needed.    Dispense:  118 mL    Refill:  0    Order Specific Question:   Supervising Provider    Answer:   Merrilee Jansky [[id number]]   benzonatate (TESSALON) 100 MG capsule    Sig: Take 1 capsule (100 mg total) by mouth 3 (three) times daily as needed.    Dispense:  30 capsule    Refill:  0    Order Specific Question:   Supervising Provider    Answer:   Merrilee Jansky [id number]     *If you need refills on other medications prior to your next appointment, please contact your  pharmacy*  Follow-Up: Call back or seek an in-person evaluation if the symptoms worsen or if the condition fails to improve as anticipated.  [facility] [phone]  Other Instructions  Acute Bronchitis, Adult  Acute bronchitis is sudden inflammation of the main airways (bronchi) that come off the windpipe (trachea) in the lungs. The swelling causes the airways to get smaller and make more mucus than normal. This can make it hard to breathe and can cause coughing or noisy breathing (wheezing). Acute bronchitis may last several weeks. The cough may last  longer. Allergies, asthma, and exposure to smoke may make the condition worse. What are the causes? This condition can be caused by germs and by substances that irritate the lungs, including: Cold and flu viruses. The most common cause of this condition is the virus that causes the common cold. Bacteria. This is less common. Breathing in substances that irritate the lungs, including: Smoke from cigarettes and other forms of tobacco. Dust and pollen. Fumes from household cleaning products, gases, or burned fuel. Indoor or outdoor air pollution. What increases the risk? The following factors may make you more likely to develop this condition: A weak body's defense system, also called the immune system. A condition that affects your lungs and breathing, such as asthma. What are the signs or symptoms? Common symptoms of this condition include: Coughing. This may bring up clear, yellow, or green mucus from your lungs (sputum). Wheezing. Runny or stuffy nose. Having too much mucus in your lungs (chest congestion). Shortness of breath. Aches and pains, including sore throat or chest. How is this diagnosed? This condition is usually diagnosed based on: Your symptoms and medical history. A physical exam. You may also have other tests, including tests to rule out other conditions, such as pneumonia. These tests include: A  test of lung function. Test of a mucus sample to look for the presence of bacteria. Tests to check the oxygen level in your blood. Blood tests. Chest X-ray. How is this treated? Most cases of acute bronchitis clear up over time without treatment. Your health care provider may recommend: Drinking more fluids to help thin your mucus so it is easier to cough up. Taking inhaled medicine (inhaler) to improve air flow in and out of your lungs. Using a vaporizer or a humidifier. These are machines that add water to the air to help you breathe better. Taking a medicine that thins mucus and clears congestion (expectorant). Taking a medicine that prevents or stops coughing (cough suppressant). It is not common to take an antibiotic medicine for this condition. Follow these instructions at home:  Take over-the-counter and prescription medicines only as told by your health care provider. Use an inhaler, vaporizer, or humidifier as told by your health care provider. Take two teaspoons (10 mL) of honey at bedtime to lessen coughing at night. Drink enough fluid to keep your urine pale yellow. Do not use any products that contain nicotine or tobacco. These products include cigarettes, chewing tobacco, and vaping devices, such as e-cigarettes. If you need help quitting, ask your health care provider. Get plenty of rest. Return to your normal activities as told by your health care provider. Ask your health care provider what activities are safe for you. Keep all follow-up visits. This is important. How is this prevented? To lower your risk of getting this condition again: Wash your hands often with soap and water for at least 20 seconds. If soap and water are not available, use hand sanitizer. Avoid contact with people who have cold symptoms. Try not to touch your mouth, nose, or eyes with your hands. Avoid breathing in smoke or chemical fumes. Breathing smoke or chemical fumes will make your condition  worse. Get the flu shot every year. Contact a health care provider if: Your symptoms do not improve after 2 weeks. You have trouble coughing up the mucus. Your cough keeps you awake at night. You have a fever. Get help right away if you: Cough up blood. Feel pain in your chest. Have severe shortness of breath. Faint  or keep feeling like you are going to faint. Have a severe headache. Have a fever or chills that get worse. These symptoms may represent a serious problem that is an emergency. Do not wait to see if the symptoms will go away. Get medical help right away. Call your local emergency services (911 in the [place] Do not drive yourself to the hospital. Summary Acute bronchitis is inflammation of the main airways (bronchi) that come off the windpipe (trachea) in the lungs. The swelling causes the airways to get smaller and make more mucus than normal. Drinking more fluids can help thin your mucus so it is easier to cough up. Take over-the-counter and prescription medicines only as told by your health care provider. Do not use any products that contain nicotine or tobacco. These products include cigarettes, chewing tobacco, and vaping devices, such as e-cigarettes. If you need help quitting, ask your health care provider. Contact a health care provider if your symptoms do not improve after 2 weeks. This information is not intended to replace advice given to you by your health care provider. Make sure you discuss any questions you have with your health care provider. Document Revised: 05/07/2021 Document Reviewed: 05/28/2020 Elsevier Patient Education  2023 [employer]    If you have been instructed to have an in-person evaluation today at a local Urgent Care facility, please use the link below. It will take you to a list of all of our available [facility], including address, phone number and hours of operation. Please do not delay care.  [facility]  If  you or a family member do not have a primary care provider, use the link below to schedule a visit and establish care. When you choose a [facility] primary care physician or advanced practice provider, you gain a long-term partner in health. Find a Primary Care Provider  Learn more about [facility]'s in-office and virtual care options: [facility] - Get Care Now

## 2022-02-15 NOTE — Telephone Encounter (Signed)
Chief Complaint: shortness of breath with exertion. Requesting medication Symptoms: cold sx and now short of breath with exertion. Using inhaler albuterol at least 4 times a day and more at night. Unable to use nebulizer does not have a machine. Difficulty sleeping and required to stay elevated. Coughing spells with vomiting at times. Coughing with white mucus. Trying to take OTC robitussin for cough  Frequency: 3 days  Pertinent Negatives: Patient denies chest pain no difficulty breathing. No fever.  Disposition: [] ED /[] Urgent Care (no appt availability in office) / [] Appointment(In office/virtual)/ [x]  [facility]/ [] Home Care/ [] Refused Recommended Disposition /[] [facility]/ []  Follow-up with PCP Additional Notes:   No available appt. Scheduled patient UC VV / CTH VV today . Patient reports she does not want to go out as recommended for UC visit. Please advise and assistance with getting nebulizer machine.       Reason for Disposition  [1] MILD difficulty breathing (e.g., minimal/no SOB at rest, SOB with walking, pulse <100) AND [2] NEW-onset or WORSE than normal  Answer Assessment - Initial Assessment Questions 1. RESPIRATORY STATUS: "Describe your breathing?" (e.g., wheezing, shortness of breath, unable to speak, severe coughing)      Sob, with exertion, severe coughing spells 2. ONSET: "When did this breathing problem begin?"      3 days ago  3. PATTERN "Does the difficult breathing come and go, or has it been constant since it started?"      Comes and goes  4. SEVERITY: "How bad is your breathing?" (e.g., mild, moderate, severe)    - MILD: No SOB at rest, mild SOB with walking, speaks normally in sentences, can lie down, no retractions, pulse < 100.    - MODERATE: SOB at rest, SOB with minimal exertion and prefers to sit, cannot lie down flat, speaks in phrases, mild retractions, audible wheezing, pulse 100-120.    - SEVERE: Very SOB at rest, speaks in  single words, struggling to breathe, sitting hunched forward, retractions, pulse > 120      Sob with exertion difficulty sleeping required sitting up  5. RECURRENT SYMPTOM: "Have you had difficulty breathing before?" If Yes, ask: "When was the last time?" and "What happened that time?"      Na  6. CARDIAC HISTORY: "Do you have any history of heart disease?" (e.g., heart attack, angina, bypass surgery, angioplasty)      na 7. LUNG HISTORY: "Do you have any history of lung disease?"  (e.g., pulmonary embolus, asthma, emphysema)     Hx asthma  8. CAUSE: "What do you think is causing the breathing problem?"      Had cold sx and now sob coughing spells with vomiting  9. OTHER SYMPTOMS: "Do you have any other symptoms? (e.g., dizziness, runny nose, cough, chest pain, fever)     Cough white mucus. Coughing spells with vomiting sob with exertion. Using inhaler more the 4 x a day. Does not have nebulizer machine  10. O2 SATURATION MONITOR:  "Do you use an oxygen saturation monitor (pulse oximeter) at home?" If Yes, ask: "What is your reading (oxygen level) today?" "What is your usual oxygen saturation reading?" (e.g., 95%)       na 11. PREGNANCY: "Is there any chance you are pregnant?" "When was your last menstrual period?"       na 12. TRAVEL: "Have you traveled out of the country in the last month?" (e.g., travel history, exposures)       na  Protocols  used: Breathing Difficulty-A-AH

## 2022-02-15 NOTE — Addendum Note (Signed)
Addended by: Karle Plumber B on: 02/15/2022 09:12 PM   Modules accepted: Orders

## 2022-02-15 NOTE — Progress Notes (Signed)
Virtual Visit Consent   Laurie Andrews, you are scheduled for a virtual visit with a [facility] provider today. Just as with appointments in the office, your consent must be obtained to participate. Your consent will be active for this visit and any virtual visit you may have with one of our providers in the next 365 days. If you have a MyChart account, a copy of this consent can be sent to you electronically.  As this is a virtual visit, video technology does not allow for your provider to perform a traditional examination. This may limit your provider's ability to fully assess your condition. If your provider identifies any concerns that need to be evaluated in person or the need to arrange testing (such as labs, EKG, etc.), we will make arrangements to do so. Although advances in technology are sophisticated, we cannot ensure that it will always work on either your end or our end. If the connection with a video visit is poor, the visit may have to be switched to a telephone visit. With either a video or telephone visit, we are not always able to ensure that we have a secure connection.  By engaging in this virtual visit, you consent to the provision of healthcare and authorize for your insurance to be billed (if applicable) for the services provided during this visit. Depending on your insurance coverage, you may receive a charge related to this service.  I need to obtain your verbal consent now. Are you willing to proceed with your visit today? Doretta Remmert has provided verbal consent on 02/15/2022 for a virtual visit (video or telephone). Margaretann Loveless, PA-C  Date: 02/15/2022 5:14 PM  Virtual Visit via Video Note   I, Margaretann Loveless, connected with  Laurie Andrews  ([id number], 09-13-1970) on 02/15/22 at  5:15 PM EST by a video-enabled telemedicine application and verified that I am speaking with the correct person using two identifiers.  Location: Patient: Virtual Visit  Location Patient: Home Provider: Virtual Visit Location Provider: Home Office   I discussed the limitations of evaluation and management by telemedicine and the availability of in person appointments. The patient expressed understanding and agreed to proceed.    History of Present Illness: Laurie Andrews is a 52 y.o. who identifies as a female who was assigned female at birth, and is being seen today for cough.  HPI: Cough This is a new problem. The current episode started in the past 7 days. The problem has been gradually worsening. The problem occurs every few minutes. The cough is Productive of sputum and productive of purulent sputum. Associated symptoms include chills, myalgias, nasal congestion, postnasal drip, rhinorrhea, a sore throat and wheezing (mild). Pertinent negatives include no ear congestion, ear pain, fever or sweats. Associated symptoms comments: Sinus congestion. Nothing aggravates the symptoms. She has tried a beta-agonist inhaler, OTC cough suppressant, leukotriene antagonists and ipratropium inhaler (theraflu, alka seltzer, mucinex, cough drops, nasal spray, tea) for the symptoms. The treatment provided no relief. Her past medical history is significant for asthma and bronchitis.     Problems:  Patient Active Problem List   Diagnosis Date Noted   Hyperlipidemia 07/25/2021   Influenza vaccine needed 05/08/2020   Obesity (BMI 30.0-34.9) 05/08/2020   Moderate persistent asthma without complication 05/08/2020   Post-traumatic osteoarthritis of both knees 05/08/2020   Acute pyelonephritis 04/09/2020   Constipation 04/09/2020   Type 2 diabetes mellitus with hyperglycemia (HCC) 04/08/2020   Essential hypertension 04/08/2020    Allergies:  Allergies  Allergen Reactions   Keflex [Cephalexin] Nausea And Vomiting   Metformin And Related    Trulicity [Dulaglutide] Nausea And Vomiting   Aspirin Nausea And Vomiting and Rash   Morphine And Related Itching and Rash    Penicillins Nausea And Vomiting and Rash   Medications:  Current Outpatient Medications:    benzonatate (TESSALON) 100 MG capsule, Take 1 capsule (100 mg total) by mouth 3 (three) times daily as needed., Disp: 30 capsule, Rfl: 0   doxycycline (VIBRA-TABS) 100 MG tablet, Take 1 tablet (100 mg total) by mouth 2 (two) times daily., Disp: 20 tablet, Rfl: 0   promethazine-dextromethorphan (PROMETHAZINE-DM) 6.25-15 MG/5ML syrup, Take 5 mLs by mouth 4 (four) times daily as needed., Disp: 118 mL, Rfl: 0   Accu-Chek Softclix Lancets lancets, Use as instructed, Disp: 100 each, Rfl: 12   ADVAIR DISKUS 100-50 MCG/ACT AEPB, INHALE 1 PUFF INTO THE LUNGS TWICE A DAY, Disp: 1 each, Rfl: 3   albuterol (PROVENTIL) (2.5 MG/3ML) 0.083% nebulizer solution, INHALE 3 ML BY NEBULIZATION EVERY 6 HOURS AS NEEDED FOR WHEEZING OR SHORTNESS OF BREATH, Disp: 150 mL, Rfl: 0   albuterol (VENTOLIN HFA) 108 (90 Base) MCG/ACT inhaler, INHALE 1-2 PUFFS INTO THE LUNGS DAILY AS NEEDED FOR WHEEZING OR SHORTNESS OF BREATH., Disp: 25.5 each, Rfl: 0   amitriptyline (ELAVIL) 100 MG tablet, Take 100 mg by mouth as needed for sleep., Disp: , Rfl:    amLODipine (NORVASC) 10 MG tablet, Take 1 tablet (10 mg total) by mouth daily., Disp: 30 tablet, Rfl: 6   atorvastatin (LIPITOR) 10 MG tablet, TAKE 1 TABLET BY MOUTH EVERY DAY, Disp: 90 tablet, Rfl: 1   blood glucose meter kit and supplies KIT, Dispense based on patient and insurance preference. Use up to four times daily as directed. (FOR ICD-9 250.00, 250.01)., Disp: 1 each, Rfl: 0   blood glucose meter kit and supplies KIT, Dispense based on patient and insurance preference. Use up to four times daily as directed., Disp: 1 each, Rfl: 0   Blood Glucose Monitoring Suppl (ACCU-CHEK GUIDE) w/Device KIT, UAD, Disp: 1 kit, Rfl: 0   cetirizine (ZYRTEC) 10 MG tablet, Take 10 mg by mouth daily., Disp: , Rfl:    Continuous Blood Gluc Receiver (FREESTYLE LIBRE 3 READER) DEVI, 1 application  by Does not  apply route in the morning, at noon, and at bedtime. Use to check blood sugar TID. E11.65, Disp: 1 each, Rfl: 0   Continuous Blood Gluc Sensor (FREESTYLE LIBRE 3 SENSOR) MISC, Use to check blood sugar TID. E11.65, Disp: 2 each, Rfl: 3   diclofenac Sodium (VOLTAREN) 1 % GEL, Apply 2 g topically 4 (four) times daily., Disp: 50 g, Rfl: 0   GAVILAX 17 GM/SCOOP powder, Take 17 g by mouth daily as needed for mild constipation., Disp: , Rfl:    glucose blood (ACCU-CHEK GUIDE) test strip, Use as instructed, Disp: 100 each, Rfl: 12   insulin glargine (LANTUS SOLOSTAR) 100 UNIT/ML Solostar Pen, Inject 20 Units into the skin at bedtime., Disp: 15 mL, Rfl: 11   insulin lispro (HUMALOG KWIKPEN) 100 UNIT/ML KwikPen, Inject 8 Units into the skin 3 (three) times daily. Take with meals, Disp: 15 mL, Rfl: 1   Insulin Pen Needle (PEN NEEDLES) 31G X 8 MM MISC, UAD, Disp: 100 each, Rfl: 6   losartan (COZAAR) 100 MG tablet, TAKE 1 TABLET BY MOUTH EVERY DAY, Disp: 90 tablet, Rfl: 2   methocarbamol (ROBAXIN) 500 MG tablet, Take 1 tablet (500 mg total) by  mouth 2 (two) times daily., Disp: 20 tablet, Rfl: 0   montelukast (SINGULAIR) 10 MG tablet, Take 10 mg by mouth daily., Disp: , Rfl:    naloxone (NARCAN) nasal spray 4 mg/0.1 mL, SMARTSIG:Both Nares, Disp: 1 each, Rfl: 1   naratriptan (AMERGE) 2.5 MG tablet, Take 2.5 mg by mouth as needed for migraine., Disp: , Rfl:    nystatin cream (MYCOSTATIN), Apply 1 application topically 3 (three) times daily as needed for dry skin., Disp: , Rfl:    ondansetron (ZOFRAN ODT) 4 MG disintegrating tablet, Take 1 tablet (4 mg total) by mouth every 8 (eight) hours as needed for nausea or vomiting., Disp: 20 tablet, Rfl: 1   oxyCODONE (OXY IR/ROXICODONE) 5 MG immediate release tablet, Take 5 mg by mouth in the morning, at noon, and at bedtime., Disp: , Rfl:    pantoprazole (PROTONIX) 40 MG tablet, Take 40 mg by mouth 2 (two) times daily., Disp: , Rfl:    predniSONE (STERAPRED UNI-PAK 21  TAB) 5 MG (21) TBPK tablet, Take as directed, Disp: 21 tablet, Rfl: 0   senna-docusate (SENOKOT-S) 8.6-50 MG tablet, Take 1 tablet by mouth 2 (two) times daily., Disp: , Rfl:    topiramate (TOPAMAX) 25 MG tablet, Take 50 mg by mouth 2 (two) times daily., Disp: , Rfl:    triamcinolone cream (KENALOG) 0.1 %, APPLY TO AFFECTED AREA TWICE A DAY, Disp: 30 g, Rfl: 0  Observations/Objective: Patient is well-developed, well-nourished in no acute distress.  Resting comfortably at home.  Head is normocephalic, atraumatic.  No labored breathing.  Speech is clear and coherent with logical content.  Patient is alert and oriented at baseline.    Assessment and Plan: 1. Bacterial upper respiratory infection - doxycycline (VIBRA-TABS) 100 MG tablet; Take 1 tablet (100 mg total) by mouth 2 (two) times daily.  Dispense: 20 tablet; Refill: 0 - promethazine-dextromethorphan (PROMETHAZINE-DM) 6.25-15 MG/5ML syrup; Take 5 mLs by mouth 4 (four) times daily as needed.  Dispense: 118 mL; Refill: 0 - benzonatate (TESSALON) 100 MG capsule; Take 1 capsule (100 mg total) by mouth 3 (three) times daily as needed.  Dispense: 30 capsule; Refill: 0  - Worsening over a week despite OTC medications - Will treat with Doxycycline, Promethazine DM and tessalon perles - Can continue Mucinex  - Push fluids.  - Rest.  - Steam and humidifier can help - Seek in person evaluation if worsening or symptoms fail to improve    Follow Up Instructions: I discussed the assessment and treatment plan with the patient. The patient was provided an opportunity to ask questions and all were answered. The patient agreed with the plan and demonstrated an understanding of the instructions.  A copy of instructions were sent to the patient via MyChart unless otherwise noted below.    The patient was advised to call back or seek an in-person evaluation if the symptoms worsen or if the condition fails to improve as anticipated.  Time:  I spent  10 minutes with the patient via telehealth technology discussing the above problems/concerns.    Margaretann Loveless, PA-C

## 2022-02-16 NOTE — Telephone Encounter (Signed)
Requested Prescriptions  Pending Prescriptions Disp Refills   albuterol (VENTOLIN HFA) 108 (90 Base) MCG/ACT inhaler [Pharmacy Med Name: ALBUTEROL HFA (PROAIR) INHALER] 25.5 each 0    Sig: INHALE 1-2 PUFFS INTO THE LUNGS DAILY AS NEEDED FOR WHEEZING OR SHORTNESS OF BREATH.     Pulmonology:  Beta Agonists 2 Failed - 02/15/2022  3:24 PM      Failed - Last BP in normal range    BP Readings from Last 1 Encounters:  01/15/22 (!) 140/82         Passed - Last Heart Rate in normal range    Pulse Readings from Last 1 Encounters:  01/15/22 74         Passed - Valid encounter within last 12 months    Recent Outpatient Visits           1 month ago Type 2 diabetes mellitus with hyperglycemia, with long-term current use of insulin ([place])   [place] Ladell Pier, MD   2 months ago Encounter for [employer] annual wellness exam   [place] Karle Plumber B, MD   6 months ago Type 2 diabetes mellitus with obesity [facility])   [place], Deborah B, MD   1 year ago Essential hypertension   [place], MD   1 year ago Type 2 diabetes mellitus with peripheral neuropathy [facility])   [place], Jarome Matin, RPH-CPP       Future Appointments             In 1 week Daisy Blossom, Jarome Matin, [place]   In 1 month Wynetta Emery, Dalbert Batman, MD [place]

## 2022-02-18 ENCOUNTER — Telehealth: Payer: Self-pay | Admitting: Emergency Medicine

## 2022-02-18 NOTE — Telephone Encounter (Signed)
Copied from [place] [id number]. Topic: Medical Record Request - Other >> Feb 18, 2022 12:13 PM Chapman Fitch wrote: Reason for CRM: [employer] needs clinical note for the Nebulizer order they received yesterday / fax # [phone]

## 2022-02-18 NOTE — Telephone Encounter (Signed)
Form successfully faxed on 02/18/2022.

## 2022-02-19 DIAGNOSIS — J45909 Unspecified asthma, uncomplicated: Secondary | ICD-10-CM | POA: Diagnosis not present

## 2022-02-19 NOTE — Telephone Encounter (Signed)
I scheduled the patient for 02/22/2022 at 8:10 am for office notes.

## 2022-02-22 ENCOUNTER — Ambulatory Visit: Payer: 59 | Attending: Internal Medicine | Admitting: Internal Medicine

## 2022-02-22 DIAGNOSIS — Z538 Procedure and treatment not carried out for other reasons: Secondary | ICD-10-CM

## 2022-02-22 NOTE — Progress Notes (Signed)
Phone call was placed to patient this morning for telephone visit to get her qualified for nebulizer device that we had ordered last week.  Patient states that the nebulizer was already delivered by [employer] on Friday of last week.  I told her in that case we probably do not need to do this virtual visit.  I then call Shelly at [employer].  She confirmed that they received note from virtual visit that patient had on 02/15/2022.  Based on that note and qualifying diagnosis of asthma, patient was approved for the nebulizer device.

## 2022-02-23 ENCOUNTER — Ambulatory Visit: Payer: 59 | Admitting: Pharmacist

## 2022-02-28 ENCOUNTER — Other Ambulatory Visit: Payer: Self-pay | Admitting: Internal Medicine

## 2022-02-28 DIAGNOSIS — L309 Dermatitis, unspecified: Secondary | ICD-10-CM

## 2022-03-02 DIAGNOSIS — R262 Difficulty in walking, not elsewhere classified: Secondary | ICD-10-CM | POA: Diagnosis not present

## 2022-03-02 DIAGNOSIS — M79669 Pain in unspecified lower leg: Secondary | ICD-10-CM | POA: Diagnosis not present

## 2022-03-02 NOTE — Telephone Encounter (Signed)
Requested medication (s) are due for refill today: yes  Requested medication (s) are on the active medication list: yes  Last refill:  02/02/22  Future visit scheduled:yes  Notes to clinic:  Unable to refill per protocol, cannot delegate.      Requested Prescriptions  Pending Prescriptions Disp Refills   triamcinolone cream (KENALOG) 0.1 % [Pharmacy Med Name: TRIAMCINOLONE 0.1% CREAM] 30 g 0    Sig: APPLY TO AFFECTED AREA TWICE A DAY     Not Delegated - Dermatology:  Corticosteroids Failed - 02/28/2022  9:16 PM      Failed - This refill cannot be delegated      Passed - Valid encounter within last 12 months    Recent Outpatient Visits           1 week ago Appointment canceled by hospital   [place] Karle Plumber B, MD   1 month ago Type 2 diabetes mellitus with hyperglycemia, with long-term current use of insulin [facility])   [place] Ladell Pier, MD   3 months ago Encounter for [employer] annual wellness exam   [place] Karle Plumber B, MD   7 months ago Type 2 diabetes mellitus with obesity [facility])   [place] Ladell Pier, MD   1 year ago Essential hypertension   [place], MD       Future Appointments             In 1 week Daisy Blossom, Jarome Matin, [place]   In 2 weeks Ladell Pier, MD [place]

## 2022-03-03 DIAGNOSIS — H52223 Regular astigmatism, bilateral: Secondary | ICD-10-CM | POA: Diagnosis not present

## 2022-03-03 DIAGNOSIS — H02535 Eyelid retraction left lower eyelid: Secondary | ICD-10-CM | POA: Diagnosis not present

## 2022-03-03 DIAGNOSIS — Z794 Long term (current) use of insulin: Secondary | ICD-10-CM | POA: Diagnosis not present

## 2022-03-03 DIAGNOSIS — H40023 Open angle with borderline findings, high risk, bilateral: Secondary | ICD-10-CM | POA: Diagnosis not present

## 2022-03-03 DIAGNOSIS — E119 Type 2 diabetes mellitus without complications: Secondary | ICD-10-CM | POA: Diagnosis not present

## 2022-03-03 DIAGNOSIS — H04123 Dry eye syndrome of bilateral lacrimal glands: Secondary | ICD-10-CM | POA: Diagnosis not present

## 2022-03-03 DIAGNOSIS — H02532 Eyelid retraction right lower eyelid: Secondary | ICD-10-CM | POA: Diagnosis not present

## 2022-03-03 DIAGNOSIS — H25813 Combined forms of age-related cataract, bilateral: Secondary | ICD-10-CM | POA: Diagnosis not present

## 2022-03-03 DIAGNOSIS — H5213 Myopia, bilateral: Secondary | ICD-10-CM | POA: Diagnosis not present

## 2022-03-03 DIAGNOSIS — H35033 Hypertensive retinopathy, bilateral: Secondary | ICD-10-CM | POA: Diagnosis not present

## 2022-03-03 DIAGNOSIS — H524 Presbyopia: Secondary | ICD-10-CM | POA: Diagnosis not present

## 2022-03-03 LAB — HM DIABETES EYE EXAM

## 2022-03-08 ENCOUNTER — Other Ambulatory Visit: Payer: Self-pay | Admitting: Internal Medicine

## 2022-03-08 DIAGNOSIS — J4540 Moderate persistent asthma, uncomplicated: Secondary | ICD-10-CM

## 2022-03-08 NOTE — Telephone Encounter (Signed)
Requested Prescriptions  Pending Prescriptions Disp Refills   WIXELA INHUB 100-50 MCG/ACT AEPB [Pharmacy Med Name: WIXELA 100-50 INHUB] 60 each 2    Sig: INHALE 1 PUFF INTO THE LUNGS TWICE A DAY     Pulmonology:  [employer] Passed - 03/08/2022 12:10 AM      Passed - Valid encounter within last 12 months    Recent Outpatient Visits           2 weeks ago Appointment canceled by hospital   [place], MD   1 month ago Type 2 diabetes mellitus with hyperglycemia, with long-term current use of insulin [facility])   [place] Ladell Pier, MD   3 months ago Encounter for [employer] annual wellness exam   [place] Karle Plumber B, MD   7 months ago Type 2 diabetes mellitus with obesity [facility])   [place] Ladell Pier, MD   1 year ago Essential hypertension   [place], MD       Future Appointments             In 1 week Daisy Blossom, Jarome Matin, [place]   In 1 week Ladell Pier, MD [place]

## 2022-03-12 ENCOUNTER — Telehealth: Payer: Self-pay | Admitting: Orthopaedic Surgery

## 2022-03-12 ENCOUNTER — Other Ambulatory Visit: Payer: Self-pay | Admitting: Internal Medicine

## 2022-03-12 DIAGNOSIS — M545 Low back pain, unspecified: Secondary | ICD-10-CM | POA: Diagnosis not present

## 2022-03-12 DIAGNOSIS — G8929 Other chronic pain: Secondary | ICD-10-CM | POA: Diagnosis not present

## 2022-03-12 DIAGNOSIS — M5431 Sciatica, right side: Secondary | ICD-10-CM | POA: Diagnosis not present

## 2022-03-12 DIAGNOSIS — G894 Chronic pain syndrome: Secondary | ICD-10-CM | POA: Diagnosis not present

## 2022-03-12 DIAGNOSIS — M25561 Pain in right knee: Secondary | ICD-10-CM | POA: Diagnosis not present

## 2022-03-12 DIAGNOSIS — L309 Dermatitis, unspecified: Secondary | ICD-10-CM

## 2022-03-12 DIAGNOSIS — M25562 Pain in left knee: Secondary | ICD-10-CM | POA: Diagnosis not present

## 2022-03-12 DIAGNOSIS — M1710 Unilateral primary osteoarthritis, unspecified knee: Secondary | ICD-10-CM | POA: Diagnosis not present

## 2022-03-12 DIAGNOSIS — Z79891 Long term (current) use of opiate analgesic: Secondary | ICD-10-CM | POA: Diagnosis not present

## 2022-03-12 DIAGNOSIS — E1169 Type 2 diabetes mellitus with other specified complication: Secondary | ICD-10-CM

## 2022-03-12 NOTE — Telephone Encounter (Signed)
At this point, there is no reason that I can find to continue to keep her out of work.  Therefore I cannot renew her out of work note.  Thanks.

## 2022-03-12 NOTE — Telephone Encounter (Signed)
Patient called. Would like a referral for PT. Her call back number is [phone]

## 2022-03-12 NOTE — Telephone Encounter (Signed)
Patient called. Would like someone to call her. [phone]

## 2022-03-15 ENCOUNTER — Other Ambulatory Visit: Payer: Self-pay | Admitting: Internal Medicine

## 2022-03-15 ENCOUNTER — Ambulatory Visit: Payer: 59 | Attending: Internal Medicine | Admitting: Pharmacist

## 2022-03-15 DIAGNOSIS — E669 Obesity, unspecified: Secondary | ICD-10-CM | POA: Diagnosis not present

## 2022-03-15 DIAGNOSIS — E1169 Type 2 diabetes mellitus with other specified complication: Secondary | ICD-10-CM

## 2022-03-15 MED ORDER — INSULIN LISPRO (1 UNIT DIAL) 100 UNIT/ML (KWIKPEN): 10.0000 [IU] | PEN_INJECTOR | Freq: Three times a day (TID) | SUBCUTANEOUS | 0 refills | Status: DC

## 2022-03-15 MED ORDER — ACCU-CHEK SOFTCLIX LANCETS MISC: 12 refills | Status: DC

## 2022-03-15 MED ORDER — LANTUS SOLOSTAR 100 UNIT/ML ~~LOC~~ SOPN: 24.0000 [IU] | PEN_INJECTOR | Freq: Every day | SUBCUTANEOUS | 11 refills | Status: DC

## 2022-03-15 MED ORDER — ACCU-CHEK GUIDE VI STRP: ORAL_STRIP | 12 refills | Status: DC

## 2022-03-15 MED ORDER — PEN NEEDLES 32G X 4 MM MISC: 3 refills | Status: DC

## 2022-03-15 NOTE — Progress Notes (Signed)
    S:    PCP: Dr. Wynetta Emery  52 y.o. female who presents for diabetes evaluation, education, and management.  PMH is significant for 2DM w/ peripheral neuropathy, HTN, opiate dependence, anxiety disorder, asthma, migraines, OA knees, alopecia.  Patient was referred and last seen by Primary Care Provider, Dr. Wynetta Emery, on 01/15/2022.   At last visit, A1c was slightly down to 14.8% from >15%. Lantus was increased from 15 to 20 units daily and Humalog was increased from 5 to 8 units TID.   Today, patient arrives in good spirits and presents without any assistance. Patient has FreeStyle Libre 3 CGM in place. Majority of CGM readings in the 300s. Reports she needs her A1c to be around 7% to get her knee replacement surgery. 30-day GMI on CGM shows anticipated A1c to be 12.1%.  Current diabetes medications include: Lantus 20 units QHS, Humalog 8 units TID.   Patient reports adherence to taking all medications as prescribed.   Insurance coverage: UHC Medicare + Medicaid   Patient denies hypoglycemic events.  Reported home fasting blood sugars: see CGM data below   Patient reports nocturia (nighttime urination).  Patient reports neuropathy (nerve pain). Patient denies visual changes. Patient reports self foot exams.   O:  CGM data 7 day average blood glucose: 364 mg/dL 14 day average: 370 mg/dL 30 day average: 368 mg/dL 30 day GMI 12.1%   Lab Results  Component Value Date   HGBA1C 14.8 (A) 01/15/2022   There were no vitals filed for this visit.  Lipid Panel     Component Value Date/Time   CHOL 177 07/24/2021 1501   TRIG 259 (H) 07/24/2021 1501   HDL 46 07/24/2021 1501   CHOLHDL 3.8 07/24/2021 1501   LDLCALC 88 07/24/2021 1501    Clinical Atherosclerotic Cardiovascular Disease (ASCVD): No  The 10-year ASCVD risk score (Arnett DK, et al., 2019) is: 14.9%   Values used to calculate the score:     Age: 71 years     Sex: Female     Is Non-Hispanic African American: Yes      Diabetic: Yes     Tobacco smoker: No     Systolic Blood Pressure: 287 mmHg     Is BP treated: Yes     HDL Cholesterol: 46 mg/dL     Total Cholesterol: 177 mg/dL    A/P: Diabetes longstanding, currently above goal based on A1c (14.8%). Patient is able to verbalize appropriate hypoglycemia management plan. Medication adherence appears appropriate. CGM data shows patient's blood sugar rarely drops below 300.  -Increased dose of Lantus to 24 units every morning. Requested patient switch insulin administration from nighttime to morning time. -Increased dose of Humalog to 10 units TID. -Patient intolerant to Trulicity (N/V) -Defer initiation of SGLT2-I  until A1c closer to goal.  -Intolerant to metformin. -Refilled pen needles, test strips, and lancets.  -Patient educated on purpose, proper use, and potential adverse effects of insulin.  -Extensively discussed pathophysiology of diabetes, recommended lifestyle interventions, dietary effects on blood sugar control.  -Counseled on s/sx of and management of hypoglycemia.  -Next A1c anticipated March 2024.   Written patient instructions provided. Patient verbalized understanding of treatment plan.  Total time in face to face counseling 25 minutes.    Follow-up:  Pharmacist 1 month. PCP clinic visit on 03/18/2022.   Joseph Art, Pharm.D. PGY-2 Ambulatory Care Pharmacy Resident 03/15/2022 3:03 PM

## 2022-03-15 NOTE — Telephone Encounter (Signed)
Called and notified patient that we cannot write her a work note at this point. She was not happy. I explained that she was suppose to follow up with [place] 6 weeks after her last visit, and has not done so; nor has she scheduled a follow up yet. She states that "it is fine" and she is going to contact her PCP for the note.

## 2022-03-15 NOTE — Telephone Encounter (Signed)
At this point, we do not have any objective reason to keep her out of work.

## 2022-03-15 NOTE — Telephone Encounter (Signed)
Agree with xu

## 2022-03-16 NOTE — Telephone Encounter (Signed)
Requested medication (s) are due for refill today: yes  Requested medication (s) are on the active medication list: yes  Last refill:  03/15/22  Future visit scheduled: yes  Notes to clinic:    Alternative Requested:THE PRESCRIBED MEDICATION IS NOT COVERED BY INSURANCE. PLEASE CONSIDER CHANGING TO ONE OF THE SUGGESTED COVERED ALTERNATIVES.   All Pharmacy Suggested Alternatives:  insulin glargine (LANTUS SOLOSTAR) 100 UNIT/ML Solostar Pen insulin glargine, 1 Unit Dial, (TOUJEO SOLOSTAR) 300 UNIT/ML Solostar Pen insulin degludec (TRESIBA FLEXTOUCH) 100 UNIT/ML FlexTouch Pen insulin detemir (LEVEMIR FLEXTOUCH) 100 UNIT/ML FlexPen       Requested Prescriptions  Pending Prescriptions Disp Refills   LANTUS SOLOSTAR 100 UNIT/ML Solostar Pen [Pharmacy Med Name: LANTUS SOLOSTAR 100 UNIT/ML]  0     Endocrinology:  Diabetes - Insulins Failed - 03/15/2022  3:46 PM      Failed - HBA1C is between 0 and 7.9 and within 180 days    HbA1c, POC (controlled diabetic range)  Date Value Ref Range Status  01/15/2022 14.8 (A) 0.0 - 7.0 % Final         Passed - Valid encounter within last 6 months    Recent Outpatient Visits           Yesterday Type 2 diabetes mellitus with obesity [facility])   [place], Jarome Matin, RPH-CPP   3 weeks ago Appointment canceled by hospital   [facility] Karle Plumber B, MD   2 months ago Type 2 diabetes mellitus with hyperglycemia, with long-term current use of insulin [facility])   [place], MD   3 months ago Encounter for [employer] annual wellness exam   [place] Karle Plumber B, MD   7 months ago Type 2 diabetes mellitus with obesity [facility])   [place], MD       Future Appointments             In 2 days Ladell Pier, MD [place]   In 4 weeks Daisy Blossom, Jarome Matin, [place]

## 2022-03-17 ENCOUNTER — Other Ambulatory Visit: Payer: Self-pay

## 2022-03-17 MED ORDER — TOUJEO SOLOSTAR 300 UNIT/ML ~~LOC~~ SOPN: 24.0000 [IU] | PEN_INJECTOR | Freq: Every day | SUBCUTANEOUS | 1 refills | Status: DC

## 2022-03-17 NOTE — Telephone Encounter (Signed)
Patient needs an alternative to Lantus. Are you able to tell me what is covered?

## 2022-03-18 ENCOUNTER — Ambulatory Visit: Payer: 59 | Admitting: Internal Medicine

## 2022-03-18 ENCOUNTER — Other Ambulatory Visit: Payer: Self-pay | Admitting: Internal Medicine

## 2022-03-18 DIAGNOSIS — Z794 Long term (current) use of insulin: Secondary | ICD-10-CM

## 2022-03-18 NOTE — Telephone Encounter (Signed)
Requested Prescriptions  Pending Prescriptions Disp Refills   Continuous Blood Gluc Sensor (FREESTYLE LIBRE 3 SENSOR) MISC [Pharmacy Med Name: FREESTYLE LIBRE 3 SENSOR] 2 each 3    Sig: USE TO [place] A DAY     Endocrinology: Diabetes - Testing Supplies Passed - 03/18/2022 10:10 AM      Passed - Valid encounter within last 12 months    Recent Outpatient Visits           3 days ago Type 2 diabetes mellitus with obesity [facility])   [place], RPH-CPP   3 weeks ago Appointment canceled by hospital   [facility] Karle Plumber B, MD   2 months ago Type 2 diabetes mellitus with hyperglycemia, with long-term current use of insulin [facility])   [place] Ladell Pier, MD   3 months ago Encounter for [employer] annual wellness exam   [place] Karle Plumber B, MD   7 months ago Type 2 diabetes mellitus with obesity [facility])   [place] Ladell Pier, MD       Future Appointments             In 3 weeks Daisy Blossom, Jarome Matin, [place]   In 4 weeks Ladell Pier, MD [place]

## 2022-03-25 ENCOUNTER — Telehealth: Payer: Self-pay | Admitting: Emergency Medicine

## 2022-03-25 DIAGNOSIS — M25552 Pain in left hip: Secondary | ICD-10-CM

## 2022-03-25 DIAGNOSIS — M79606 Pain in leg, unspecified: Secondary | ICD-10-CM

## 2022-03-25 NOTE — Telephone Encounter (Signed)
Copied from [place]. Topic: Referral - Request for Referral >> Mar 25, 2022 10:25 AM Sabas Sous wrote: Has patient seen PCP for this complaint? Yes.   *If NO, is insurance requiring patient see PCP for this issue before PCP can refer them? Referral for which specialty: Ortho (PT) Preferred provider/office: [facility]  Reason for referral: Balance is constantly off, Dr. Erlinda Hong has requested for PCP to send referral

## 2022-03-25 NOTE — Telephone Encounter (Signed)
Copied from [place]. Topic: General - Other >> Mar 25, 2022 10:21 AM Sabas Sous wrote: Reason for CRM: [facility] is sending paperwork for the patient, needs forms completed by her appt march 7th.

## 2022-03-31 NOTE — Telephone Encounter (Signed)
PC placed to pt this afternoon x 2.  I received automated VM.  I did not leave a message.

## 2022-03-31 NOTE — Telephone Encounter (Signed)
Called & spoke to the patient. Verified name & DOB. 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  Patient stated that she has not started physical therapy. She contacted [facility] on [facility] and is still waiting to start physical therapy.

## 2022-03-31 NOTE — Telephone Encounter (Signed)
Called & spoke to the patient. Verified name & DOB. 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 According to orthopedics, she can be released to return to work. " Patient expressed understanding and stated that she is still waiting to be seen at [facility] on [facility] for physical therapy.

## 2022-04-02 DIAGNOSIS — M79669 Pain in unspecified lower leg: Secondary | ICD-10-CM | POA: Diagnosis not present

## 2022-04-02 DIAGNOSIS — R262 Difficulty in walking, not elsewhere classified: Secondary | ICD-10-CM | POA: Diagnosis not present

## 2022-04-02 NOTE — Addendum Note (Signed)
Addended by: Karle Plumber B on: 04/02/2022 01:48 PM   Modules accepted: Orders

## 2022-04-02 NOTE — Telephone Encounter (Signed)
Phone call placed to patient today. Patient had called on the 15th stating that [place] would be sending me paperwork and she needed forms completed by appointment March 7. I have not received paperwork on her.  However inform the patient that I would not be able to extend her time out of work.  She was seeing orthopedics Dr. Erlinda Hong.  On his last visit the plan was to send her to physical therapy and keep her out 6 additional weeks to do so.  However patient states she was never called and that she had been calling Dr. Phoebe Sharps office leaving messages to let them know.  However the 1st documented call I see she made to his office was 03/15/2022 to inform that she was due back to work the following week and had not received a call for physical therapy.  Orthopedics states that at this point she can be released to return to work.  I informed patient that she should have called the orthopedics office much sooner rather than waiting the week before she was return to work to make contact. Patient is stating that she does not feel she would be able to stand 9 hours at work and would like to do the physical therapy.  However her insurance has not approved the physical therapy at Dr. Phoebe Sharps office; would have to go to [employer].   She has put in for intermittent leave from her job and is not sure what would happen with that.  I told her that I can give her an appointment back to the orthopedics for reevaluation.

## 2022-04-09 DIAGNOSIS — M25561 Pain in right knee: Secondary | ICD-10-CM | POA: Diagnosis not present

## 2022-04-09 DIAGNOSIS — Z79891 Long term (current) use of opiate analgesic: Secondary | ICD-10-CM | POA: Diagnosis not present

## 2022-04-09 DIAGNOSIS — M545 Low back pain, unspecified: Secondary | ICD-10-CM | POA: Diagnosis not present

## 2022-04-09 DIAGNOSIS — G894 Chronic pain syndrome: Secondary | ICD-10-CM | POA: Diagnosis not present

## 2022-04-09 DIAGNOSIS — M25562 Pain in left knee: Secondary | ICD-10-CM | POA: Diagnosis not present

## 2022-04-09 DIAGNOSIS — G8929 Other chronic pain: Secondary | ICD-10-CM | POA: Diagnosis not present

## 2022-04-09 DIAGNOSIS — M5431 Sciatica, right side: Secondary | ICD-10-CM | POA: Diagnosis not present

## 2022-04-09 DIAGNOSIS — M1710 Unilateral primary osteoarthritis, unspecified knee: Secondary | ICD-10-CM | POA: Diagnosis not present

## 2022-04-10 ENCOUNTER — Other Ambulatory Visit: Payer: Self-pay | Admitting: Internal Medicine

## 2022-04-10 DIAGNOSIS — L309 Dermatitis, unspecified: Secondary | ICD-10-CM

## 2022-04-13 ENCOUNTER — Ambulatory Visit: Payer: 59 | Attending: Internal Medicine | Admitting: Pharmacist

## 2022-04-13 DIAGNOSIS — E1169 Type 2 diabetes mellitus with other specified complication: Secondary | ICD-10-CM

## 2022-04-13 DIAGNOSIS — E669 Obesity, unspecified: Secondary | ICD-10-CM | POA: Diagnosis not present

## 2022-04-13 LAB — POCT GLYCOSYLATED HEMOGLOBIN (HGB A1C): HbA1c, POC (controlled diabetic range): 13.3 % — AB (ref 0.0–7.0)

## 2022-04-13 MED ORDER — FREESTYLE LIBRE 3 READER DEVI: 1.0000 "application " | Freq: Three times a day (TID) | 0 refills | Status: AC

## 2022-04-13 NOTE — Progress Notes (Signed)
    S:    PCP: Dr. Wynetta Emery  52 y.o. female who presents for diabetes evaluation, education, and management.  PMH is significant for 2DM w/ peripheral neuropathy, HTN, opiate dependence, anxiety disorder, asthma, migraines, OA knees, alopecia. Patient was referred and last seen by Primary Care Provider, Dr. Wynetta Emery, on 01/15/2022. Pharmacy saw her 03/15/2022 and titrated her insulin.  Today, patient arrives in good spirits and presents without any assistance. Patient has FreeStyle Libre 3 CGM in place. Her CGM data is summarized below. 30-day GMI correlates with an A1c of 10.2%. Her A1c today in our clinic is 13.3  Current diabetes medications include: Lantus 24 units daily, Humalog 10 units TID.  Patient reports adherence to taking all medications as prescribed.   Insurance coverage: UHC Medicare + Medicaid   Patient denies hypoglycemic events.  Reported home fasting blood sugars: see CGM data below   Patient reports nocturia (nighttime urination).  Patient reports neuropathy (nerve pain). Patient denies visual changes. Patient reports self foot exams.   O:  Date of Download: 04/13/2022 % Time CGM is active: 97% Average Glucose: 334 mg/dL Glucose Management Indicator: 11.3%  Glucose Variability: 17.8% (goal <36%) Time in Goal:  - Time in range 70-180: 0% - Time above range: 100% - Time below range: 0% Observed patterns: highest averages are from 6pm to 12am and from 12am to 6am.   Lab Results  Component Value Date   HGBA1C 13.3 (A) 04/13/2022   There were no vitals filed for this visit.  Lipid Panel     Component Value Date/Time   CHOL 177 07/24/2021 1501   TRIG 259 (H) 07/24/2021 1501   HDL 46 07/24/2021 1501   CHOLHDL 3.8 07/24/2021 1501   LDLCALC 88 07/24/2021 1501    Clinical Atherosclerotic Cardiovascular Disease (ASCVD): No  The 10-year ASCVD risk score (Arnett DK, et al., 2019) is: 14.9%   Values used to calculate the score:     Age: 1 years     Sex:  Female     Is Non-Hispanic African American: Yes     Diabetic: Yes     Tobacco smoker: No     Systolic Blood Pressure: [id number] mmHg     Is BP treated: Yes     HDL Cholesterol: 46 mg/dL     Total Cholesterol: 177 mg/dL    A/P: Diabetes longstanding, currently above goal based on A1c and home CGM data. However, A1c is starting to trend down. We will need to be more aggressive with insulin adjustment. Patient is able to verbalize appropriate hypoglycemia management plan. Medication adherence appears appropriate. -Increased dose of Lantus to 30 units every morning.  -Continued Humalog 10 units TID. -Patient intolerant to Trulicity (N/V) -Defer initiation of SGLT2-I  until A1c closer to goal.  -Intolerant to metformin. -Patient has plenty of CGM supplies. New rx sent for reader per request. Tells me the app on her phone sometimes doesn't work. -Patient educated on purpose, proper use, and potential adverse effects of insulin.  -Extensively discussed pathophysiology of diabetes, recommended lifestyle interventions, dietary effects on blood sugar control.  -Counseled on s/sx of and management of hypoglycemia.  -Next A1c anticipated June 2024.   Written patient instructions provided. Patient verbalized understanding of treatment plan.  Total time in face to face counseling 30 minutes.    Follow-up:  PCP clinic visit on 04/15/2022.   Benard Halsted, PharmD, [place], [place] [phone]

## 2022-04-14 ENCOUNTER — Ambulatory Visit (INDEPENDENT_AMBULATORY_CARE_PROVIDER_SITE_OTHER): Payer: 59 | Admitting: Physician Assistant

## 2022-04-14 DIAGNOSIS — M79605 Pain in left leg: Secondary | ICD-10-CM | POA: Diagnosis not present

## 2022-04-14 NOTE — Progress Notes (Signed)
Office Visit Note   Patient: Laurie Andrews           Date of Birth: October 20, 1970           MRN: [id number] Visit Date: 04/14/2022              Requested by: Ladell Pier, MD [place] [address],  [place] [address] PCP: Ladell Pier, MD   Assessment & Plan: Visit Diagnoses:  1. Pain in left leg     Plan: Impression is chronic left leg pain and paresthesias.  At this point, MRIs of both the lumbar spine and pelvis have been obtained which were essentially unremarkable.  We are not able to explain the patient's symptoms as they do not appear to be orthopedic related.  We discussed referral to neurology at this point.  She will follow-up as needed.  Follow-Up Instructions: Return if symptoms worsen or fail to improve.   Orders:  Orders Placed This Encounter  Procedures   Ambulatory referral to Neurology   No orders of the defined types were placed in this encounter.     Procedures: No procedures performed   Clinical Data: No additional findings.   Subjective: Chief Complaint  Patient presents with   Left Leg - Pain   Left Hip - Pain    HPI patient is a 52 year old female who comes in today with continued left leg pain.  This is been ongoing for many months.  The pain she has is to the entire left leg with occasional swelling.  Symptoms are worse when she is standing and lying down.  She is on chronic oxycodone.  She notes numbness, tingling and burning everywhere.  She has previously undergone lumbar spine MRI as well as pelvis MRI which were essentially unremarkable.  Left knee x-ray was unremarkable with the exception of mild OA.   Review of Systems as detailed in HPI.  All others reviewed and are negative.   Objective: Vital Signs: There were no vitals taken for this visit.  Physical Exam well-developed well-nourished female no acute distress.  Alert and oriented x 3.  Ortho Exam unchanged left lower extremity exam  Specialty Comments:   No specialty comments available.  Imaging: No new imaging   PMFS History: Patient Active Problem List   Diagnosis Date Noted   Hyperlipidemia 07/25/2021   Influenza vaccine needed 05/08/2020   Obesity (BMI 30.0-34.9) 05/08/2020   Moderate persistent asthma without complication [id number]   Post-traumatic osteoarthritis of both knees 05/08/2020   Acute pyelonephritis 04/09/2020   Constipation 04/09/2020   Type 2 diabetes mellitus with hyperglycemia ([place]) 04/08/2020   Essential hypertension 04/08/2020   Past Medical History:  Diagnosis Date   Anxiety    Asthma    Diabetes mellitus without complication (HCC)    Hypertension     Family History  Problem Relation Age of Onset   Breast cancer Maternal Aunt     Past Surgical History:  Procedure Laterality Date   BREAST SURGERY     CHOLECYSTECTOMY     HIGH RISK BREAST EXCISION     KNEE SURGERY     Social History   Occupational History   Not on file  Tobacco Use   Smoking status: Never   Smokeless tobacco: Never  Vaping Use   Vaping Use: Never used  Substance and Sexual Activity   Alcohol use: Not Currently   Drug use: Never   Sexual activity: Not on file

## 2022-04-15 ENCOUNTER — Encounter: Payer: Self-pay | Admitting: Internal Medicine

## 2022-04-15 ENCOUNTER — Ambulatory Visit: Payer: 59 | Attending: Internal Medicine | Admitting: Internal Medicine

## 2022-04-15 VITALS — BP 144/79 | HR 79 | Temp 98.1°F | Ht 61.0 in | Wt 184.2 lb

## 2022-04-15 DIAGNOSIS — E559 Vitamin D deficiency, unspecified: Secondary | ICD-10-CM

## 2022-04-15 DIAGNOSIS — K219 Gastro-esophageal reflux disease without esophagitis: Secondary | ICD-10-CM | POA: Diagnosis not present

## 2022-04-15 DIAGNOSIS — Z8669 Personal history of other diseases of the nervous system and sense organs: Secondary | ICD-10-CM | POA: Diagnosis not present

## 2022-04-15 DIAGNOSIS — Z794 Long term (current) use of insulin: Secondary | ICD-10-CM | POA: Diagnosis not present

## 2022-04-15 DIAGNOSIS — M25562 Pain in left knee: Secondary | ICD-10-CM

## 2022-04-15 DIAGNOSIS — G8929 Other chronic pain: Secondary | ICD-10-CM | POA: Diagnosis not present

## 2022-04-15 DIAGNOSIS — E1159 Type 2 diabetes mellitus with other circulatory complications: Secondary | ICD-10-CM | POA: Diagnosis not present

## 2022-04-15 DIAGNOSIS — E669 Obesity, unspecified: Secondary | ICD-10-CM

## 2022-04-15 DIAGNOSIS — M79605 Pain in left leg: Secondary | ICD-10-CM | POA: Diagnosis not present

## 2022-04-15 DIAGNOSIS — E1165 Type 2 diabetes mellitus with hyperglycemia: Secondary | ICD-10-CM | POA: Diagnosis not present

## 2022-04-15 DIAGNOSIS — I152 Hypertension secondary to endocrine disorders: Secondary | ICD-10-CM | POA: Diagnosis not present

## 2022-04-15 MED ORDER — NARATRIPTAN HCL 2.5 MG PO TABS: 2.5000 mg | ORAL_TABLET | ORAL | 1 refills | Status: DC | PRN

## 2022-04-15 MED ORDER — PANTOPRAZOLE SODIUM 40 MG PO TBEC: 40.0000 mg | DELAYED_RELEASE_TABLET | Freq: Two times a day (BID) | ORAL | 4 refills | Status: DC

## 2022-04-15 MED ORDER — MONTELUKAST SODIUM 10 MG PO TABS: 10.0000 mg | ORAL_TABLET | Freq: Every day | ORAL | 6 refills | Status: DC

## 2022-04-15 MED ORDER — SENNOSIDES-DOCUSATE SODIUM 8.6-50 MG PO TABS: 1.0000 | ORAL_TABLET | Freq: Every day | ORAL | 1 refills | Status: DC | PRN

## 2022-04-15 NOTE — Progress Notes (Signed)
Patient ID: Laurie Andrews, female    DOB: [birth year]  MRN: [id number]  CC: Diabetes (Dm f/u. Med refills. Neoma Laming received flu vax. Yes to pap smear on another visit)   Subjective: Laurie Andrews is a 52 y.o. female who presents for chronic ds management Her concerns today include:  Patient with history of DM type II with peripheral neuropathy, HTN, opiate dependence, anxiety disorder, asthma, migraines, OA knees, alopecia.      DM: Lab Results  Component Value Date   HGBA1C 13.3 (A) 04/13/2022  Since last visit with me, patient seen by clinical pharmacist 2 days ago.  A1c had slightly improved but was still elevated.  She was continued on Humalog 10 units with meals and Lantus insulin was increased from 24 units to 30 units -Now has CCM.  Most of her blood sugars have remained above 250.  Blood sugar this morning before she had her breakfast shake was 270.  Time in range of 70-180 has been 2% in the past week and 3% in the last 2 weeks. -Eating habits:  a.m she has a Naked  Smoothie.  Had 1 this morning for breakfast and blood sugar went to 350.  Other than that she drinks mainly water.  Getting in fruits and veggies. Meats- chicken, fish, [place] at the meats that she eats -She walks around her apartment complex every day for about 1 hour.  Her daughter walks with her to make sure she does not fall.  Tells me that she uses her cane when she goes walking.  HTN: On last visit I increased amlodipine to 10 mg daily.  She was continued on Cozaar 100 mg daily Did not take meds as yet this a.m Limits salt No device to check BP.  She would like a device.  HL: Reports compliance with atorvastatin.  She saw her orthopedics yesterday and follow-up for her left leg pain.  Recommendation is copied below:  Plan: Impression is chronic left leg pain and paresthesias.  At this point, MRIs of both the lumbar spine and pelvis have been obtained which were essentially unremarkable.  We are not able  to explain the patient's symptoms as they do not appear to be orthopedic related.  We discussed referral to neurology at this point.  She will follow-up as needed.   She was supposed to return to work February 8.  She has not returned.  States she has requested leave of absence.  Feels she is not able to stand for prolonged periods because of her left knee.  Feels like the knee wants to give out at times.  Reports having near falls and having to use her cane all the time.  Of note however she does not have a cane with her today.  She plans to follow through with seeing a neurologist. She walks around her apartment complex every day for about 1 hour.  Her daughter walks with her to make sure she does not fall.  Tells me that she uses her cane when she goes walking.  Requests refill on vitamin D 1000 units.  States that she has been out of it for a while.  Currently not on medication list.  Also requests refill on montelukast, Amerge, Protonix and Senokot-S HM:  due for pap.  Had c-scope 2-3 yrs ago at [facility] in [place].  Patient Active Problem List   Diagnosis Date Noted   Hyperlipidemia 07/25/2021   Influenza vaccine needed 05/08/2020   Obesity (BMI 30.0-34.9) 05/08/2020  Moderate persistent asthma without complication [id number]   Post-traumatic osteoarthritis of both knees 05/08/2020   Acute pyelonephritis 04/09/2020   Constipation 04/09/2020   Type 2 diabetes mellitus with hyperglycemia ([place]) 04/08/2020   Essential hypertension 04/08/2020     Current Outpatient Medications on File Prior to Visit  Medication Sig Dispense Refill   Accu-Chek Softclix Lancets lancets Use as instructed 100 each 12   albuterol (PROVENTIL) (2.5 MG/3ML) 0.083% nebulizer solution INHALE 3 ML BY NEBULIZATION EVERY 6 HOURS AS NEEDED FOR WHEEZING OR SHORTNESS OF BREATH 150 mL 0   albuterol (VENTOLIN HFA) 108 (90 Base) MCG/ACT inhaler INHALE 1-2 PUFFS INTO THE LUNGS DAILY AS NEEDED FOR WHEEZING OR SHORTNESS OF  BREATH. 25.5 each 0   amitriptyline (ELAVIL) 100 MG tablet Take 100 mg by mouth as needed for sleep.     amLODipine (NORVASC) 10 MG tablet Take 1 tablet (10 mg total) by mouth daily. 30 tablet 6   atorvastatin (LIPITOR) 10 MG tablet TAKE 1 TABLET BY MOUTH EVERY DAY 90 tablet 1   benzonatate (TESSALON) 100 MG capsule Take 1 capsule (100 mg total) by mouth 3 (three) times daily as needed. 30 capsule 0   cetirizine (ZYRTEC) 10 MG tablet Take 10 mg by mouth daily.     Continuous Blood Gluc Receiver (FREESTYLE LIBRE 3 READER) DEVI 1 application  by Does not apply route in the morning, at noon, and at bedtime. Use to check blood sugar TID. E11.65 1 each 0   Continuous Blood Gluc Sensor (FREESTYLE LIBRE 3 SENSOR) MISC USE TO CHECK BLOOD SUGAR THREE TIMES A DAY 2 each 3   diclofenac Sodium (VOLTAREN) 1 % GEL Apply 2 g topically 4 (four) times daily. 50 g 0   doxycycline (VIBRA-TABS) 100 MG tablet Take 1 tablet (100 mg total) by mouth 2 (two) times daily. 20 tablet 0   fluticasone-salmeterol (WIXELA INHUB) 100-50 MCG/ACT AEPB INHALE 1 PUFF INTO THE LUNGS TWICE A DAY 60 each 2   GAVILAX 17 GM/SCOOP powder Take 17 g by mouth daily as needed for mild constipation.     glucose blood (ACCU-CHEK GUIDE) test strip Use as instructed 100 each 12   insulin glargine, 1 Unit Dial, (TOUJEO SOLOSTAR) 300 UNIT/ML Solostar Pen Inject 24 Units into the skin daily. 4.5 mL 1   insulin lispro (HUMALOG KWIKPEN) 100 UNIT/ML KwikPen Inject 10 Units into the skin 3 (three) times daily. 15 mL 0   Insulin Pen Needle (PEN NEEDLES) 32G X 4 MM MISC Use a total of 4 pen needles daily for insulin injection. 100 each 3   losartan (COZAAR) 100 MG tablet TAKE 1 TABLET BY MOUTH EVERY DAY 90 tablet 2   naloxone (NARCAN) nasal spray 4 mg/0.1 mL USE AS DIRECTED 2 each 0   nystatin cream (MYCOSTATIN) Apply 1 application topically 3 (three) times daily as needed for dry skin.     oxyCODONE (OXY IR/ROXICODONE) 5 MG immediate release tablet Take 5  mg by mouth in the morning, at noon, and at bedtime.     topiramate (TOPAMAX) 25 MG tablet Take 50 mg by mouth 2 (two) times daily.     triamcinolone cream (KENALOG) 0.1 % APPLY TO AFFECTED AREA TWICE A DAY 30 g 0   predniSONE (STERAPRED UNI-PAK 21 TAB) 5 MG (21) TBPK tablet Take as directed (Patient not taking: Reported on 04/15/2022) 21 tablet 0   promethazine-dextromethorphan (PROMETHAZINE-DM) 6.25-15 MG/5ML syrup Take 5 mLs by mouth 4 (four) times daily as needed. (Patient not taking:  Reported on 04/15/2022) 118 mL 0   No current facility-administered medications on file prior to visit.    Allergies  Allergen Reactions   Keflex [Cephalexin] Nausea And Vomiting   Metformin And Related    Trulicity [Dulaglutide] Nausea And Vomiting   Aspirin Nausea And Vomiting and Rash   Morphine And Related Itching and Rash   Penicillins Nausea And Vomiting and Rash    Social History   Socioeconomic History   Marital status: Single    Spouse name: Not on file   Number of children: Not on file   Years of education: Not on file   Highest education level: Not on file  Occupational History   Not on file  Tobacco Use   Smoking status: Never   Smokeless tobacco: Never  Vaping Use   Vaping Use: Never used  Substance and Sexual Activity   Alcohol use: Not Currently   Drug use: Never   Sexual activity: Not on file  Other Topics Concern   Not on file  Social History Narrative   Not on file   Social Determinants of Health   Financial Resource Strain: Low Risk  (11/27/2021)   Overall Financial Resource Strain (CARDIA)    Difficulty of Paying Living Expenses: Not very hard  Food Insecurity: No Food Insecurity (11/27/2021)   Hunger Vital Sign    Worried About Running Out of Food in the Last Year: Never true    Ran Out of Food in the Last Year: Never true  Transportation Needs: No Transportation Needs (11/27/2021)   PRAPARE - Hydrologist (Medical): No    Lack of  Transportation (Non-Medical): No  Physical Activity: Insufficiently Active (11/27/2021)   Exercise Vital Sign    Days of Exercise per Week: 7 days    Minutes of Exercise per Session: 10 min  Stress: No Stress Concern Present (11/27/2021)   [place]    Feeling of Stress : Not at all  Social Connections: Socially Isolated (11/27/2021)   Social Connection and Isolation Panel [NHANES]    Frequency of Communication with Friends and Family: Three times a week    Frequency of Social Gatherings with Friends and Family: Never    Attends Religious Services: Never    Marine scientist or Organizations: No    Attends Archivist Meetings: Never    Marital Status: Never married  Intimate Partner Violence: Not At Risk (11/27/2021)   Humiliation, Afraid, Rape, and Kick questionnaire    Fear of Current or Ex-Partner: No    Emotionally Abused: No    Physically Abused: No    Sexually Abused: No    Family History  Problem Relation Age of Onset   Breast cancer Maternal Aunt     Past Surgical History:  Procedure Laterality Date   BREAST SURGERY     CHOLECYSTECTOMY     HIGH RISK BREAST EXCISION     KNEE SURGERY      ROS: Review of Systems Negative except as stated above  PHYSICAL EXAM: BP (!) 144/79 (BP Location: Left Arm, Patient Position: Sitting, Cuff Size: Normal)   Pulse 79   Temp 98.1 F (36.7 C) (Oral)   Ht '5\' 1"'$  (1.549 m)   Wt 184 lb 3.2 oz (83.6 kg)   SpO2 100%   BMI 34.80 kg/m   Physical Exam  General appearance - alert, well appearing, and in no distress Mental status - normal mood,  behavior, speech, dress, motor activity, and thought processes Chest - clear to auscultation, no wheezes, rales or rhonchi, symmetric air entry Heart - normal rate, regular rhythm, normal S1, S2, no murmurs, rubs, clicks or gallops Musculoskeletal -patient ambulates favoring the left leg.  Again noted and  brought to her attention is the fact that she does not have any assistive device with her today as was the case on last visit -left thigh slightly larger in circumference than the right and this has been chronic from 11/2021 -No edema in the legs below the knees     Latest Ref Rng & Units 07/24/2021    3:01 PM 04/09/2020    4:28 AM 04/08/2020    6:18 AM  CMP  Glucose 70 - 99 mg/dL 341  191  259   BUN 6 - 24 mg/dL '11  6  9   '$ Creatinine 0.57 - 1.00 mg/dL 0.78  0.65  0.55   Sodium 134 - 144 mmol/L 134  136  137   Potassium 3.5 - 5.2 mmol/L 4.7  3.9  3.8   Chloride 96 - 106 mmol/L 97  104  105   CO2 20 - 29 mmol/L '24  20  22   '$ Calcium 8.7 - 10.2 mg/dL 9.8  9.3  8.5   Total Protein 6.0 - 8.5 g/dL 7.7   5.9   Total Bilirubin 0.0 - 1.2 mg/dL 0.6   1.2   Alkaline Phos 44 - 121 IU/L 188   108   AST 0 - 40 IU/L 15   17   ALT 0 - 32 IU/L 26   18    Lipid Panel     Component Value Date/Time   CHOL 177 07/24/2021 1501   TRIG 259 (H) 07/24/2021 1501   HDL 46 07/24/2021 1501   CHOLHDL 3.8 07/24/2021 1501   LDLCALC 88 07/24/2021 1501    CBC    Component Value Date/Time   WBC 5.0 07/24/2021 1501   WBC 6.2 04/09/2020 0428   RBC 5.41 (H) 07/24/2021 1501   RBC 5.23 (H) 04/09/2020 0428   HGB 13.7 07/24/2021 1501   HCT 42.6 07/24/2021 1501   PLT 356 07/24/2021 1501   MCV 79 07/24/2021 1501   MCH 25.3 (L) 07/24/2021 1501   MCH 25.6 (L) 04/09/2020 0428   MCHC 32.2 07/24/2021 1501   MCHC 31.3 04/09/2020 0428   RDW 12.0 07/24/2021 1501   LYMPHSABS 2.4 04/09/2020 0428   MONOABS 0.5 04/09/2020 0428   EOSABS 0.1 04/09/2020 0428   BASOSABS 0.0 04/09/2020 0428    ASSESSMENT AND PLAN: 1. Type 2 diabetes mellitus with hyperglycemia, with long-term current use of insulin (HCC) Not at goal.  Most of her blood sugar readings are above 300s.  Just recently had increased dose of Lantus insulin from 24 to 30 units.  I recommend that after 2 to 3 days if she is still waking up with blood sugars greater  than 200, she should increase Lantus insulin an additional 3 units.  Continue NovoLog 10 units with meals.  Advised to stop drinking the naked smoothie as it apparently is high in sugar content.  She can try Glucerna shakes. Advised that if she has low blood sugars during the night, she should sometimes check with her manual glucometer to make sure that the continuous glucose monitor is correct then eat something immediately  2. Hypertension associated with diabetes ([place]) Not at goal but she has not taken medicines as yet for today. No changes  made.  She will continue current dose of Norvasc and Cozaar. - For home use only DME Other see comment  3. Pain of left lower extremity Advised patient that at this point orthopedics is not willing to keep her out of work any further.  The most I am willing to do his complete FMLA if she wishes to do that.  If she wants to get a second opinion, we can refer her to [facility].  Patient would like to get the second opinion. I think it is contradictory that she is walking with a limp favoring the left leg and reports fear of falls but does not have cane with her.  States that she left it in the car. - Ambulatory referral to Orthopedics  4. Chronic pain of left knee - Ambulatory referral to Orthopedics  5. Obesity (BMI 30.0-34.9) Discussed on encourage healthy eating habits and encouraged her to move is much as she can.  6. Vitamin D deficiency - VITAMIN D 25 Hydroxy (Vit-D Deficiency, Fractures)  7. History of migraine - naratriptan (AMERGE) 2.5 MG tablet; Take 1 tablet (2.5 mg total) by mouth as needed for migraine.  Dispense: 10 tablet; Refill: 1  8. Gastroesophageal reflux disease without esophagitis - pantoprazole (PROTONIX) 40 MG tablet; Take 1 tablet (40 mg total) by mouth 2 (two) times daily.  Dispense: 30 tablet; Refill: 4   I will have her sign a release again for [place] to try to get colonoscopy report for the procedure that she had in  [place] 2 to 3 years ago  Patient was given the opportunity to ask questions.  Patient verbalized understanding of the plan and was able to repeat key elements of the plan.   This documentation was completed using Radio producer.  Any transcriptional errors are unintentional.  Orders Placed This Encounter  Procedures   For home use only DME Other see comment   VITAMIN D 25 Hydroxy (Vit-D Deficiency, Fractures)   Ambulatory referral to Orthopedics     Requested Prescriptions   Signed Prescriptions Disp Refills   montelukast (SINGULAIR) 10 MG tablet 30 tablet 6    Sig: Take 1 tablet (10 mg total) by mouth daily.   naratriptan (AMERGE) 2.5 MG tablet 10 tablet 1    Sig: Take 1 tablet (2.5 mg total) by mouth as needed for migraine.   pantoprazole (PROTONIX) 40 MG tablet 30 tablet 4    Sig: Take 1 tablet (40 mg total) by mouth 2 (two) times daily.   senna-docusate (SENOKOT-S) 8.6-50 MG tablet 30 tablet 1    Sig: Take 1 tablet by mouth daily as needed for mild constipation.    Return in about 3 months (around 07/16/2022) for [facility] in 1 mth. Sign release to get colonsocpy report from [facility].  Karle Plumber, MD, FACP

## 2022-04-15 NOTE — Patient Instructions (Signed)
Your blood sugars are still elevated.  If after 2 days, your morning blood sugars are still greater than 200, I recommend increasing Lantus insulin to 33 units. If blood sugars drop low during the night, please double check the reading by doing a manual check on your glucometer. I would stop the smoothies that you are drinking in the mornings as they are elevating your blood sugars above 300.  Consider Glucerna shakes.  I have referred you to [facility] for second opinion on your leg and knee.

## 2022-04-16 ENCOUNTER — Other Ambulatory Visit: Payer: Self-pay | Admitting: Internal Medicine

## 2022-04-16 LAB — VITAMIN D 25 HYDROXY (VIT D DEFICIENCY, FRACTURES): Vit D, 25-Hydroxy: 24.4 ng/mL — ABNORMAL LOW (ref 30.0–100.0)

## 2022-04-16 MED ORDER — VITAMIN D3 25 MCG (1000 UT) PO CAPS: 1000.0000 [IU] | ORAL_CAPSULE | Freq: Every day | ORAL | 1 refills | Status: DC

## 2022-04-20 ENCOUNTER — Telehealth: Payer: Self-pay | Admitting: Internal Medicine

## 2022-04-20 NOTE — Telephone Encounter (Signed)
Copied from [place]. Topic: General - Other >> Apr 20, 2022 12:24 PM Ja-Kwan M wrote: Reason for CRM: Pt stated she received a call from the office yesterday informing her that a Rx for Vitamin D would be sent to her pharmacy but when she contacted the pharmacy the Rx had not been received.

## 2022-04-21 MED ORDER — VITAMIN D3 25 MCG (1000 UT) PO CAPS: 1000.0000 [IU] | ORAL_CAPSULE | Freq: Every day | ORAL | 1 refills | Status: AC

## 2022-04-21 NOTE — Addendum Note (Signed)
Addended by: Karle Plumber B on: 04/21/2022 08:41 AM   Modules accepted: Orders

## 2022-04-21 NOTE — Telephone Encounter (Signed)
Called & spoke to the patient. Confirmed name & DOB. Informed that prescription was resent to the pharmacy. Patient expressed verbal understanding.

## 2022-04-22 ENCOUNTER — Other Ambulatory Visit: Payer: Self-pay | Admitting: Internal Medicine

## 2022-04-22 NOTE — Telephone Encounter (Signed)
Unable to refill per protocol, Rx request was refilled 04/21/22 for 100 days and 1 refill.  Requested Prescriptions  Pending Prescriptions Disp Refills   Cholecalciferol (VITAMIN D3) 25 MCG (1000 UT) capsule 100 capsule 1    Sig: Take 1 capsule (1,000 Units total) by mouth daily.     Endocrinology:  Vitamins - Vitamin D Supplementation 2 Failed - 04/22/2022 10:24 AM      Failed - Manual Review: Route requests for 50,000 IU strength to the provider      Failed - Vitamin D in normal range and within 360 days    Vit D, 25-Hydroxy  Date Value Ref Range Status  04/15/2022 24.4 (L) 30.0 - 100.0 ng/mL Final    Comment:    Vitamin D deficiency has been defined by the [place] practice guideline as a level of serum 25-OH vitamin D less than 20 ng/mL (1,2). The Endocrine Society went on to further define vitamin D insufficiency as a level between 21 and 29 ng/mL (2). 1. IOM (Institute of Medicine). 2010. Dietary reference    intakes for calcium and D. [place]: The    [employer]. 2. Holick MF, Binkley Elmo, Bischoff-Ferrari HA, et al.    Evaluation, treatment, and prevention of vitamin D    deficiency: an Endocrine Society clinical practice    guideline. JCEM. 2011 Jul; 96(7):1911-30.          Passed - Ca in normal range and within 360 days    Calcium  Date Value Ref Range Status  07/24/2021 9.8 8.7 - 10.2 mg/dL Final         Passed - Valid encounter within last 12 months    Recent Outpatient Visits           1 week ago Type 2 diabetes mellitus with hyperglycemia, with long-term current use of insulin ([place])   [place] Karle Plumber B, MD   1 week ago Type 2 diabetes mellitus with obesity [facility])   [place], [place] L, RPH-CPP   1 month ago Type 2 diabetes mellitus with obesity [facility])   [place], RPH-CPP   1 month ago Appointment canceled by hospital   [facility] Karle Plumber B, MD   3 months ago Type 2 diabetes mellitus with hyperglycemia, with long-term current use of insulin [facility])   [place], MD       Future Appointments             In 3 weeks Daisy Blossom, Jarome Matin, [place]   In 2 months Wynetta Emery, Dalbert Batman, MD [place]

## 2022-04-22 NOTE — Telephone Encounter (Signed)
Medication Refill - Medication: Cholecalciferol (VITAMIN D3) 25 MCG (1000 UT) capsule   Has the patient contacted their pharmacy? yes (Agent: If no, request that the patient contact the pharmacy for the refill. If patient does not wish to contact the pharmacy document the reason why and proceed with request.) (Agent: If yes, when and what did the pharmacy advise?)contact pcp  Preferred Pharmacy (with phone number or street name): [id number] [place] Phone: [phone]: [phone] the patient been seen for an appointment in the last year OR does the patient have an upcoming appointment? yes  Agent: Please be advised that RX refills may take up to 3 business days. We ask that you follow-up with your pharmacy.

## 2022-04-25 ENCOUNTER — Other Ambulatory Visit: Payer: Self-pay | Admitting: Internal Medicine

## 2022-04-25 DIAGNOSIS — K219 Gastro-esophageal reflux disease without esophagitis: Secondary | ICD-10-CM

## 2022-04-26 NOTE — Telephone Encounter (Signed)
Patient was called and informed that insurance will not pay for OTC medication, Patient has been informed of which dosage of medication she should be taking.

## 2022-04-26 NOTE — Telephone Encounter (Signed)
Pt called in stating the pharmacy says they still don't have a prescription for this medication. Please advise.

## 2022-05-01 DIAGNOSIS — M79669 Pain in unspecified lower leg: Secondary | ICD-10-CM | POA: Diagnosis not present

## 2022-05-01 DIAGNOSIS — R262 Difficulty in walking, not elsewhere classified: Secondary | ICD-10-CM | POA: Diagnosis not present

## 2022-05-03 DIAGNOSIS — Z79891 Long term (current) use of opiate analgesic: Secondary | ICD-10-CM | POA: Diagnosis not present

## 2022-05-03 DIAGNOSIS — M1710 Unilateral primary osteoarthritis, unspecified knee: Secondary | ICD-10-CM | POA: Diagnosis not present

## 2022-05-03 DIAGNOSIS — M5431 Sciatica, right side: Secondary | ICD-10-CM | POA: Diagnosis not present

## 2022-05-03 DIAGNOSIS — M545 Low back pain, unspecified: Secondary | ICD-10-CM | POA: Diagnosis not present

## 2022-05-03 DIAGNOSIS — G894 Chronic pain syndrome: Secondary | ICD-10-CM | POA: Diagnosis not present

## 2022-05-03 DIAGNOSIS — G8929 Other chronic pain: Secondary | ICD-10-CM | POA: Diagnosis not present

## 2022-05-03 DIAGNOSIS — M25562 Pain in left knee: Secondary | ICD-10-CM | POA: Diagnosis not present

## 2022-05-03 DIAGNOSIS — M25561 Pain in right knee: Secondary | ICD-10-CM | POA: Diagnosis not present

## 2022-05-05 DIAGNOSIS — M170 Bilateral primary osteoarthritis of knee: Secondary | ICD-10-CM | POA: Diagnosis not present

## 2022-05-17 ENCOUNTER — Ambulatory Visit: Payer: 59 | Attending: Internal Medicine | Admitting: Pharmacist

## 2022-05-17 DIAGNOSIS — E669 Obesity, unspecified: Secondary | ICD-10-CM

## 2022-05-17 DIAGNOSIS — E1169 Type 2 diabetes mellitus with other specified complication: Secondary | ICD-10-CM | POA: Diagnosis not present

## 2022-05-17 MED ORDER — INSULIN LISPRO (1 UNIT DIAL) 100 UNIT/ML (KWIKPEN): PEN_INJECTOR | SUBCUTANEOUS | 0 refills | Status: DC

## 2022-05-17 MED ORDER — OZEMPIC (0.25 OR 0.5 MG/DOSE) 2 MG/3ML ~~LOC~~ SOPN: 0.2500 mg | PEN_INJECTOR | SUBCUTANEOUS | 1 refills | Status: DC

## 2022-05-17 NOTE — Progress Notes (Signed)
S:    PCP: Dr. Laural Benes  52 y.o. female who presents for diabetes evaluation, education, and management.  PMH is significant for 2DM w/ peripheral neuropathy, HTN, opiate dependence, anxiety disorder, asthma, migraines, OA knees, alopecia. Patient was referred by Primary Care Provider, Dr. Laural Benes, on 01/15/2022. Last seen by PCP on 04/15/2022. Last seen by pharmacy clinic on 04/13/2022. A1c was pharmacy clinic was 13.3%, down from 14.8%. Lantus was increased from 24 units to 30 units daily.   Today, patient arrives in good spirits and presents without any assistance. Patient has FreeStyle Libre 3 CGM in place. Her CGM data is summarized below. Control is somewhat improved since last visit; however, average blood sugar is 287 and estimated A1c is 10.2%. Patient needs an A1c of 7% for knee replacement surgery. Discussed the possibility of trying Ozempic (intolerance to Trulicity - N/V) as insulin alone is not controlling her blood sugar. Patient is agreeable as her family member is also on Ozempic.   Current diabetes medications include: Lantus 30 units daily, Humalog 10 units TID.  Patient reports adherence to taking all medications as prescribed.   Insurance coverage: UHC Medicare + Medicaid   Patient reports hypoglycemic events, 2 isolated events observed on CGM report. Patient corrected with orange juice appropriately.   Reported home fasting blood sugars: see CGM data below   Patient reports nocturia (nighttime urination).  Patient reports neuropathy (nerve pain). Patient denies visual changes. Patient reports self foot exams.  Patient reported  Breakfast: glucose shake Lunch: tuna fish sandwich, 2 piece of bread Dinner: baked chicken, rice (1 cup), veggies   Snacks: reports not snacking Beverages: water, coffee (sugar creamer)  Exercise: walks around complex (15-20 mins) 3 times a week; starting physical therapy   O:  Date of Download: 05/04/2022 to 05/17/2022 % Time CGM is active:  95% Average Glucose: 287 mg/dL (down from 237) Glucose Management Indicator: 10.2%  Glucose Variability: 26.7% (goal <36%) Time in Goal:  - Time in range 70-180: 9% - Time above range: 91% - Time below range: 0% Observed patterns:    Lab Results  Component Value Date   HGBA1C 13.3 (A) 04/13/2022   There were no vitals filed for this visit.  Lipid Panel     Component Value Date/Time   CHOL 177 07/24/2021 1501   TRIG 259 (H) 07/24/2021 1501   HDL 46 07/24/2021 1501   CHOLHDL 3.8 07/24/2021 1501   LDLCALC 88 07/24/2021 1501    Clinical Atherosclerotic Cardiovascular Disease (ASCVD): No  The 10-year ASCVD risk score (Arnett DK, et al., 2019) is: 16.4%   Values used to calculate the score:     Age: 48 years     Sex: Female     Is Non-Hispanic African American: Yes     Diabetic: Yes     Tobacco smoker: No     Systolic Blood Pressure: 144 mmHg     Is BP treated: Yes     HDL Cholesterol: 46 mg/dL     Total Cholesterol: 177 mg/dL    A/P: Diabetes longstanding, currently above goal based on A1c and home CGM data. Patient is able to verbalize appropriate hypoglycemia management plan. Medication adherence appears appropriate. I do believe patient is going to need therapy intensification in order to get A1c at goal for knee surgery. Patient agreeable to starting Ozempic.  -Continued Lantus to 30 units every morning.  -Increased Humalog to 10 units with breakfast and 14 units with lunch and supper. -Started Ozempic 0.25  mg weekly. If not tolerated, instructed patient to reduce dose to 10 clicks (~0.125 mg).  -Defer initiation of SGLT2-I  until A1c closer to goal.  -Intolerant to metformin. -Patient educated on purpose, proper use, and potential adverse effects of insulin.  -Extensively discussed pathophysiology of diabetes, recommended lifestyle interventions, dietary effects on blood sugar control.  -Counseled on s/sx of and management of hypoglycemia.  -Next A1c anticipated  June 2024.   Written patient instructions provided. Patient verbalized understanding of treatment plan.  Total time in face to face counseling 30 minutes.    Follow-up:  Pharmacist: 1 month PCP visit: June 2024  Valeda Malm, Ilda Basset.D. PGY-2 Ambulatory Care Pharmacy Resident

## 2022-05-21 ENCOUNTER — Ambulatory Visit: Payer: Self-pay

## 2022-05-21 ENCOUNTER — Telehealth: Payer: 59 | Admitting: Physician Assistant

## 2022-05-21 DIAGNOSIS — B9689 Other specified bacterial agents as the cause of diseases classified elsewhere: Secondary | ICD-10-CM | POA: Diagnosis not present

## 2022-05-21 DIAGNOSIS — J069 Acute upper respiratory infection, unspecified: Secondary | ICD-10-CM

## 2022-05-21 DIAGNOSIS — I10 Essential (primary) hypertension: Secondary | ICD-10-CM | POA: Diagnosis not present

## 2022-05-21 DIAGNOSIS — R509 Fever, unspecified: Secondary | ICD-10-CM | POA: Diagnosis not present

## 2022-05-21 DIAGNOSIS — Z681 Body mass index (BMI) 19 or less, adult: Secondary | ICD-10-CM | POA: Diagnosis not present

## 2022-05-21 DIAGNOSIS — R110 Nausea: Secondary | ICD-10-CM | POA: Diagnosis not present

## 2022-05-21 MED ORDER — DOXYCYCLINE HYCLATE 100 MG PO TABS: 100.0000 mg | ORAL_TABLET | Freq: Two times a day (BID) | ORAL | 0 refills | Status: DC

## 2022-05-21 MED ORDER — PROMETHAZINE-DM 6.25-15 MG/5ML PO SYRP
5.0000 mL | ORAL_SOLUTION | Freq: Four times a day (QID) | ORAL | 0 refills | Status: DC | PRN
Start: 2022-05-21 — End: 2023-06-28

## 2022-05-21 NOTE — Patient Instructions (Signed)
Laurie Andrews, thank you for joining Margaretann Loveless, PA-C for today's virtual visit.  While this provider is not your primary care provider (PCP), if your PCP is located in our provider database this encounter information will be shared with them immediately following your visit.   A [facility] MyChart account gives you access to today's visit and all your visits, tests, and labs performed at [facility] " click here if you don't have a [facility] MyChart account or go to [email or web address]  Consent: (Patient) Laurie Andrews provided verbal consent for this virtual visit at the beginning of the encounter.  Current Medications:  Current Outpatient Medications:    Accu-Chek Softclix Lancets lancets, Use as instructed, Disp: 100 each, Rfl: 12   albuterol (PROVENTIL) (2.5 MG/3ML) 0.083% nebulizer solution, INHALE 3 ML BY NEBULIZATION EVERY 6 HOURS AS NEEDED FOR WHEEZING OR SHORTNESS OF BREATH, Disp: 150 mL, Rfl: 0   albuterol (VENTOLIN HFA) 108 (90 Base) MCG/ACT inhaler, INHALE 1-2 PUFFS INTO THE LUNGS DAILY AS NEEDED FOR WHEEZING OR SHORTNESS OF BREATH., Disp: 25.5 each, Rfl: 0   amitriptyline (ELAVIL) 100 MG tablet, Take 100 mg by mouth as needed for sleep., Disp: , Rfl:    amLODipine (NORVASC) 10 MG tablet, Take 1 tablet (10 mg total) by mouth daily., Disp: 30 tablet, Rfl: 6   atorvastatin (LIPITOR) 10 MG tablet, TAKE 1 TABLET BY MOUTH EVERY DAY, Disp: 90 tablet, Rfl: 1   benzonatate (TESSALON) 100 MG capsule, Take 1 capsule (100 mg total) by mouth 3 (three) times daily as needed., Disp: 30 capsule, Rfl: 0   cetirizine (ZYRTEC) 10 MG tablet, Take 10 mg by mouth daily., Disp: , Rfl:    Cholecalciferol (VITAMIN D3) 25 MCG (1000 UT) capsule, Take 1 capsule (1,000 Units total) by mouth daily., Disp: 100 capsule, Rfl: 1   Continuous Blood Gluc Receiver (FREESTYLE LIBRE 3 READER) DEVI, 1 application  by Does not apply route in the morning, at noon, and at bedtime. Use to  check blood sugar TID. E11.65, Disp: 1 each, Rfl: 0   Continuous Blood Gluc Sensor (FREESTYLE LIBRE 3 SENSOR) MISC, USE TO CHECK BLOOD SUGAR THREE TIMES A DAY, Disp: 2 each, Rfl: 3   diclofenac Sodium (VOLTAREN) 1 % GEL, Apply 2 g topically 4 (four) times daily., Disp: 50 g, Rfl: 0   doxycycline (VIBRA-TABS) 100 MG tablet, Take 1 tablet (100 mg total) by mouth 2 (two) times daily., Disp: 20 tablet, Rfl: 0   fluticasone-salmeterol (WIXELA INHUB) 100-50 MCG/ACT AEPB, INHALE 1 PUFF INTO THE LUNGS TWICE A DAY, Disp: 60 each, Rfl: 2   GAVILAX 17 GM/SCOOP powder, Take 17 g by mouth daily as needed for mild constipation., Disp: , Rfl:    glucose blood (ACCU-CHEK GUIDE) test strip, Use as instructed, Disp: 100 each, Rfl: 12   insulin glargine, 1 Unit Dial, (TOUJEO SOLOSTAR) 300 UNIT/ML Solostar Pen, Inject 24 Units into the skin daily., Disp: 4.5 mL, Rfl: 1   insulin lispro (HUMALOG KWIKPEN) 100 UNIT/ML KwikPen, Inject 10 units subcutaneously before breakfast, 14 units before lunch, and 14 units before dinner., Disp: 15 mL, Rfl: 0   Insulin Pen Needle (PEN NEEDLES) 32G X 4 MM MISC, Use a total of 4 pen needles daily for insulin injection., Disp: 100 each, Rfl: 3   losartan (COZAAR) 100 MG tablet, TAKE 1 TABLET BY MOUTH EVERY DAY, Disp: 90 tablet, Rfl: 2   montelukast (SINGULAIR) 10 MG tablet, Take 1 tablet (10 mg total) by mouth daily., Disp:  30 tablet, Rfl: 6   naloxone (NARCAN) nasal spray 4 mg/0.1 mL, USE AS DIRECTED, Disp: 2 each, Rfl: 0   naratriptan (AMERGE) 2.5 MG tablet, Take 1 tablet (2.5 mg total) by mouth as needed for migraine., Disp: 10 tablet, Rfl: 1   nystatin cream (MYCOSTATIN), Apply 1 application topically 3 (three) times daily as needed for dry skin., Disp: , Rfl:    oxyCODONE (OXY IR/ROXICODONE) 5 MG immediate release tablet, Take 5 mg by mouth in the morning, at noon, and at bedtime., Disp: , Rfl:    pantoprazole (PROTONIX) 40 MG tablet, Take 1 tablet (40 mg total) by mouth 2 (two) times  daily. Dx K21.9, Disp: 180 tablet, Rfl: 1   predniSONE (STERAPRED UNI-PAK 21 TAB) 5 MG (21) TBPK tablet, Take as directed (Patient not taking: Reported on 04/15/2022), Disp: 21 tablet, Rfl: 0   promethazine-dextromethorphan (PROMETHAZINE-DM) 6.25-15 MG/5ML syrup, Take 5 mLs by mouth 4 (four) times daily as needed., Disp: 118 mL, Rfl: 0   Semaglutide,0.25 or 0.5MG /DOS, (OZEMPIC, 0.25 OR 0.5 MG/DOSE,) 2 MG/3ML SOPN, Inject 0.25 mg into the skin once a week., Disp: 3 mL, Rfl: 1   senna-docusate (SENOKOT-S) 8.6-50 MG tablet, Take 1 tablet by mouth daily as needed for mild constipation., Disp: 30 tablet, Rfl: 1   topiramate (TOPAMAX) 25 MG tablet, Take 50 mg by mouth 2 (two) times daily., Disp: , Rfl:    triamcinolone cream (KENALOG) 0.1 %, APPLY TO AFFECTED AREA TWICE A DAY, Disp: 30 g, Rfl: 0   Medications ordered in this encounter:  Meds ordered this encounter  Medications   doxycycline (VIBRA-TABS) 100 MG tablet    Sig: Take 1 tablet (100 mg total) by mouth 2 (two) times daily.    Dispense:  20 tablet    Refill:  0    Order Specific Question:   Supervising Provider    Answer:   Merrilee Jansky [id number]   promethazine-dextromethorphan (PROMETHAZINE-DM) 6.25-15 MG/5ML syrup    Sig: Take 5 mLs by mouth 4 (four) times daily as needed.    Dispense:  118 mL    Refill:  0    Order Specific Question:   Supervising Provider    Answer:   Merrilee Jansky [[id number]]     *If you need refills on other medications prior to your next appointment, please contact your pharmacy*  Follow-Up: Call back or seek an in-person evaluation if the symptoms worsen or if the condition fails to improve as anticipated.  [facility] [phone]  Other Instructions  Upper Respiratory Infection, Adult An upper respiratory infection (URI) is a common viral infection of the nose, throat, and upper air passages that lead to the lungs. The most common type of URI is the common cold. URIs usually get  better on their own, without medical treatment. What are the causes? A URI is caused by a virus. You may catch a virus by: Breathing in droplets from an infected person's cough or sneeze. Touching something that has been exposed to the virus (is contaminated) and then touching your mouth, nose, or eyes. What increases the risk? You are more likely to get a URI if: You are very young or very old. You have close contact with others, such as at work, school, or a health care facility. You smoke. You have long-term (chronic) heart or lung disease. You have a weakened disease-fighting system (immune system). You have nasal allergies or asthma. You are experiencing a lot of stress. You have  poor nutrition. What are the signs or symptoms? A URI usually involves some of the following symptoms: Runny or stuffy (congested) nose. Cough. Sneezing. Sore throat. Headache. Fatigue. Fever. Loss of appetite. Pain in your forehead, behind your eyes, and over your cheekbones (sinus pain). Muscle aches. Redness or irritation of the eyes. Pressure in the ears or face. How is this diagnosed? This condition may be diagnosed based on your medical history and symptoms, and a physical exam. Your health care provider may use a swab to take a mucus sample from your nose (nasal swab). This sample can be tested to determine what virus is causing the illness. How is this treated? URIs usually get better on their own within 7-10 days. Medicines cannot cure URIs, but your health care provider may recommend certain medicines to help relieve symptoms, such as: Over-the-counter cold medicines. Cough suppressants. Coughing is a type of defense against infection that helps to clear the respiratory system, so take these medicines only as recommended by your health care provider. Fever-reducing medicines. Follow these instructions at home: Activity Rest as needed. If you have a fever, stay home from work or school  until your fever is gone or until your health care provider says your URI cannot spread to other people (is no longer contagious). Your health care provider may have you wear a face mask to prevent your infection from spreading. Relieving symptoms Gargle with a mixture of salt and water 3-4 times a day or as needed. To make salt water, completely dissolve -1 tsp (3-6 g) of salt in 1 cup (237 mL) of warm water. Use a cool-mist humidifier to add moisture to the air. This can help you breathe more easily. Eating and drinking  Drink enough fluid to keep your urine pale yellow. Eat soups and other clear broths. General instructions  Take over-the-counter and prescription medicines only as told by your health care provider. These include cold medicines, fever reducers, and cough suppressants. Do not use any products that contain nicotine or tobacco. These products include cigarettes, chewing tobacco, and vaping devices, such as e-cigarettes. If you need help quitting, ask your health care provider. Stay away from secondhand smoke. Stay up to date on all immunizations, including the yearly (annual) flu vaccine. Keep all follow-up visits. This is important. How to prevent the spread of infection to others URIs can be contagious. To prevent the infection from spreading: Wash your hands with soap and water for at least 20 seconds. If soap and water are not available, use hand sanitizer. Avoid touching your mouth, face, eyes, or nose. Cough or sneeze into a tissue or your sleeve or elbow instead of into your hand or into the air.  Contact a health care provider if: You are getting worse instead of better. You have a fever or chills. Your mucus is brown or red. You have yellow or brown discharge coming from your nose. You have pain in your face, especially when you bend forward. You have swollen neck glands. You have pain while swallowing. You have white areas in the back of your throat. Get help  right away if: You have shortness of breath that gets worse. You have severe or persistent: Headache. Ear pain. Sinus pain. Chest pain. You have chronic lung disease along with any of the following: Making high-pitched whistling sounds when you breathe, most often when you breathe out (wheezing). Prolonged cough (more than 14 days). Coughing up blood. A change in your usual mucus. You have a stiff neck.  You have changes in your: Vision. Hearing. Thinking. Mood. These symptoms may be an emergency. Get help right away. Call 911. Do not wait to see if the symptoms will go away. Do not drive yourself to the hospital. Summary An upper respiratory infection (URI) is a common infection of the nose, throat, and upper air passages that lead to the lungs. A URI is caused by a virus. URIs usually get better on their own within 7-10 days. Medicines cannot cure URIs, but your health care provider may recommend certain medicines to help relieve symptoms. This information is not intended to replace advice given to you by your health care provider. Make sure you discuss any questions you have with your health care provider. Document Revised: 08/27/2020 Document Reviewed: 08/27/2020 Elsevier Patient Education  2023 Elsevier Inc.    If you have been instructed to have an in-person evaluation today at a local Urgent Care facility, please use the link below. It will take you to a list of all of our available [facility], including address, phone number and hours of operation. Please do not delay care.  [facility]  If you or a family member do not have a primary care provider, use the link below to schedule a visit and establish care. When you choose a [facility] primary care physician or advanced practice provider, you gain a long-term partner in health. Find a Primary Care Provider  Learn more about [facility]'s in-office and virtual care options:  - Get  Care Now

## 2022-05-21 NOTE — Telephone Encounter (Signed)
  Chief Complaint: Cough, green phlegm, vomiting phlegm Symptoms: Fever, BA HA Frequency: 3 days Pertinent Negatives: Patient denies  Disposition: [] ED /[] Urgent Care (no appt availability in office) / [] Appointment(In office/virtual)/ [x]  [facility]/ [] Home Care/ [] Refused Recommended Disposition /[] [facility]/ []  Follow-up with PCP Additional Notes: PT states that she has vomited the last 2 nights. She vomited phlegm. She has a cough, fever, BA, HA, and her asthma is acting up. Pt has been staying in bed. Pt has tried OTC medications w/o relief.    Summary: Vomiting   Vomiting 2 nights in a row, can't keep food down, head hurts when she coughs     Reason for Disposition  Fever present > 3 days (72 hours)  Answer Assessment - Initial Assessment Questions 1. ONSET: "When did the nasal discharge start?"      3 days 2. AMOUNT: "How much discharge is there?"      A lot 3. COUGH: "Do you have a cough?" If Yes, ask: "Describe the color of your sputum" (clear, white, yellow, green)     Cough - Green 4. RESPIRATORY DISTRESS: "Describe your breathing."      ok 5. FEVER: "Do you have a fever?" If Yes, ask: "What is your temperature, how was it measured, and when did it start?"      Comes and goes 6. SEVERITY: "Overall, how bad are you feeling right now?" (e.g., doesn't interfere with normal activities, staying home from school/work, staying in bed)      In bed 7. OTHER SYMPTOMS: "Do you have any other symptoms?" (e.g., sore throat, earache, wheezing, vomiting)     Vomiting, Cough, asthma, BA  Protocols used: Common Cold-A-AH

## 2022-05-21 NOTE — Progress Notes (Signed)
Virtual Visit Consent   Laurie Andrews, you are scheduled for a virtual visit with a [place] provider today. Just as with appointments in the office, your consent must be obtained to participate. Your consent will be active for this visit and any virtual visit you may have with one of our providers in the next 365 days. If you have a MyChart account, a copy of this consent can be sent to you electronically.  As this is a virtual visit, video technology does not allow for your provider to perform a traditional examination. This may limit your provider's ability to fully assess your condition. If your provider identifies any concerns that need to be evaluated in person or the need to arrange testing (such as labs, EKG, etc.), we will make arrangements to do so. Although advances in technology are sophisticated, we cannot ensure that it will always work on either your end or our end. If the connection with a video visit is poor, the visit may have to be switched to a telephone visit. With either a video or telephone visit, we are not always able to ensure that we have a secure connection.  By engaging in this virtual visit, you consent to the provision of healthcare and authorize for your insurance to be billed (if applicable) for the services provided during this visit. Depending on your insurance coverage, you may receive a charge related to this service.  I need to obtain your verbal consent now. Are you willing to proceed with your visit today? Laurie Andrews has provided verbal consent on 05/21/2022 for a virtual visit (video or telephone). Margaretann Loveless, PA-C  Date: 05/21/2022 1:09 PM  Virtual Visit via Video Note   I, Margaretann Loveless, connected with  Laurie Andrews  ([id number], 02-Dec-1970) on 05/21/22 at  1:00 PM EDT by a video-enabled telemedicine application and verified that I am speaking with the correct person using two identifiers.  Location: Patient: Virtual Visit  Location Patient: Home Provider: Virtual Visit Location Provider: Home Office   I discussed the limitations of evaluation and management by telemedicine and the availability of in person appointments. The patient expressed understanding and agreed to proceed.    History of Present Illness: Laurie Andrews is a 52 y.o. who identifies as a female who was assigned female at birth, and is being seen today for URI symptoms.  HPI: URI  This is a new problem. The current episode started in the past 7 days (3-4 days ago). Maximum temperature: subjective fevers. Associated symptoms include congestion, coughing, diarrhea, headaches, nausea, a plugged ear sensation, rhinorrhea (post nasal drainage), sinus pain, a sore throat, vomiting and wheezing. Pertinent negatives include no chest pain or ear pain. Associated symptoms comments: Hoarse voice, myalgias. Treatments tried: theraflu, mucinex. The treatment provided no relief.     Problems:  Patient Active Problem List   Diagnosis Date Noted   Hyperlipidemia 07/25/2021   Influenza vaccine needed 05/08/2020   Obesity (BMI 30.0-34.9) 05/08/2020   Moderate persistent asthma without complication 05/08/2020   Post-traumatic osteoarthritis of both knees 05/08/2020   Acute pyelonephritis 04/09/2020   Constipation 04/09/2020   Type 2 diabetes mellitus with hyperglycemia 04/08/2020   Essential hypertension 04/08/2020    Allergies:  Allergies  Allergen Reactions   Keflex [Cephalexin] Nausea And Vomiting   Metformin And Related    Trulicity [Dulaglutide] Nausea And Vomiting   Aspirin Nausea And Vomiting and Rash   Morphine And Related Itching and Rash   Penicillins Nausea And Vomiting  and Rash   Medications:  Current Outpatient Medications:    Accu-Chek Softclix Lancets lancets, Use as instructed, Disp: 100 each, Rfl: 12   albuterol (PROVENTIL) (2.5 MG/3ML) 0.083% nebulizer solution, INHALE 3 ML BY NEBULIZATION EVERY 6 HOURS AS NEEDED FOR WHEEZING OR  SHORTNESS OF BREATH, Disp: 150 mL, Rfl: 0   albuterol (VENTOLIN HFA) 108 (90 Base) MCG/ACT inhaler, INHALE 1-2 PUFFS INTO THE LUNGS DAILY AS NEEDED FOR WHEEZING OR SHORTNESS OF BREATH., Disp: 25.5 each, Rfl: 0   amitriptyline (ELAVIL) 100 MG tablet, Take 100 mg by mouth as needed for sleep., Disp: , Rfl:    amLODipine (NORVASC) 10 MG tablet, Take 1 tablet (10 mg total) by mouth daily., Disp: 30 tablet, Rfl: 6   atorvastatin (LIPITOR) 10 MG tablet, TAKE 1 TABLET BY MOUTH EVERY DAY, Disp: 90 tablet, Rfl: 1   benzonatate (TESSALON) 100 MG capsule, Take 1 capsule (100 mg total) by mouth 3 (three) times daily as needed., Disp: 30 capsule, Rfl: 0   cetirizine (ZYRTEC) 10 MG tablet, Take 10 mg by mouth daily., Disp: , Rfl:    Cholecalciferol (VITAMIN D3) 25 MCG (1000 UT) capsule, Take 1 capsule (1,000 Units total) by mouth daily., Disp: 100 capsule, Rfl: 1   Continuous Blood Gluc Receiver (FREESTYLE LIBRE 3 READER) DEVI, 1 application  by Does not apply route in the morning, at noon, and at bedtime. Use to check blood sugar TID. E11.65, Disp: 1 each, Rfl: 0   Continuous Blood Gluc Sensor (FREESTYLE LIBRE 3 SENSOR) MISC, USE TO CHECK BLOOD SUGAR THREE TIMES A DAY, Disp: 2 each, Rfl: 3   diclofenac Sodium (VOLTAREN) 1 % GEL, Apply 2 g topically 4 (four) times daily., Disp: 50 g, Rfl: 0   doxycycline (VIBRA-TABS) 100 MG tablet, Take 1 tablet (100 mg total) by mouth 2 (two) times daily., Disp: 20 tablet, Rfl: 0   fluticasone-salmeterol (WIXELA INHUB) 100-50 MCG/ACT AEPB, INHALE 1 PUFF INTO THE LUNGS TWICE A DAY, Disp: 60 each, Rfl: 2   GAVILAX 17 GM/SCOOP powder, Take 17 g by mouth daily as needed for mild constipation., Disp: , Rfl:    glucose blood (ACCU-CHEK GUIDE) test strip, Use as instructed, Disp: 100 each, Rfl: 12   insulin glargine, 1 Unit Dial, (TOUJEO SOLOSTAR) 300 UNIT/ML Solostar Pen, Inject 24 Units into the skin daily., Disp: 4.5 mL, Rfl: 1   insulin lispro (HUMALOG KWIKPEN) 100 UNIT/ML KwikPen,  Inject 10 units subcutaneously before breakfast, 14 units before lunch, and 14 units before dinner., Disp: 15 mL, Rfl: 0   Insulin Pen Needle (PEN NEEDLES) 32G X 4 MM MISC, Use a total of 4 pen needles daily for insulin injection., Disp: 100 each, Rfl: 3   losartan (COZAAR) 100 MG tablet, TAKE 1 TABLET BY MOUTH EVERY DAY, Disp: 90 tablet, Rfl: 2   montelukast (SINGULAIR) 10 MG tablet, Take 1 tablet (10 mg total) by mouth daily., Disp: 30 tablet, Rfl: 6   naloxone (NARCAN) nasal spray 4 mg/0.1 mL, USE AS DIRECTED, Disp: 2 each, Rfl: 0   naratriptan (AMERGE) 2.5 MG tablet, Take 1 tablet (2.5 mg total) by mouth as needed for migraine., Disp: 10 tablet, Rfl: 1   nystatin cream (MYCOSTATIN), Apply 1 application topically 3 (three) times daily as needed for dry skin., Disp: , Rfl:    oxyCODONE (OXY IR/ROXICODONE) 5 MG immediate release tablet, Take 5 mg by mouth in the morning, at noon, and at bedtime., Disp: , Rfl:    pantoprazole (PROTONIX) 40 MG tablet,  Take 1 tablet (40 mg total) by mouth 2 (two) times daily. Dx K21.9, Disp: 180 tablet, Rfl: 1   predniSONE (STERAPRED UNI-PAK 21 TAB) 5 MG (21) TBPK tablet, Take as directed (Patient not taking: Reported on 04/15/2022), Disp: 21 tablet, Rfl: 0   promethazine-dextromethorphan (PROMETHAZINE-DM) 6.25-15 MG/5ML syrup, Take 5 mLs by mouth 4 (four) times daily as needed., Disp: 118 mL, Rfl: 0   Semaglutide,0.25 or 0.5MG /DOS, (OZEMPIC, 0.25 OR 0.5 MG/DOSE,) 2 MG/3ML SOPN, Inject 0.25 mg into the skin once a week., Disp: 3 mL, Rfl: 1   senna-docusate (SENOKOT-S) 8.6-50 MG tablet, Take 1 tablet by mouth daily as needed for mild constipation., Disp: 30 tablet, Rfl: 1   topiramate (TOPAMAX) 25 MG tablet, Take 50 mg by mouth 2 (two) times daily., Disp: , Rfl:    triamcinolone cream (KENALOG) 0.1 %, APPLY TO AFFECTED AREA TWICE A DAY, Disp: 30 g, Rfl: 0  Observations/Objective: Patient is well-developed, well-nourished in no acute distress.  Resting comfortably at  home.  Head is normocephalic, atraumatic.  No labored breathing.  Speech is clear and coherent with logical content.  Patient is alert and oriented at baseline.  Hoarse  Assessment and Plan: 1. Bacterial upper respiratory infection - doxycycline (VIBRA-TABS) 100 MG tablet; Take 1 tablet (100 mg total) by mouth 2 (two) times daily.  Dispense: 20 tablet; Refill: 0 - promethazine-dextromethorphan (PROMETHAZINE-DM) 6.25-15 MG/5ML syrup; Take 5 mLs by mouth 4 (four) times daily as needed.  Dispense: 118 mL; Refill: 0  - Worsening symptoms that have not responded to OTC medications.  - Will give Doxycycline and Promethazine DM - Continue asthma medications.  - Withholding prednisone at this time as asthma still being somewhat controlled with inhaler and nebulizer and last A1c was 13.3 - Steam and humidifier can help - Stay well hydrated and get plenty of rest.  - Seek in person evaluation if no symptom improvement or if symptoms worsen   Follow Up Instructions: I discussed the assessment and treatment plan with the patient. The patient was provided an opportunity to ask questions and all were answered. The patient agreed with the plan and demonstrated an understanding of the instructions.  A copy of instructions were sent to the patient via MyChart unless otherwise noted below.    The patient was advised to call back or seek an in-person evaluation if the symptoms worsen or if the condition fails to improve as anticipated.  Time:  I spent 10 minutes with the patient via telehealth technology discussing the above problems/concerns.    Margaretann Loveless, PA-C

## 2022-05-28 ENCOUNTER — Other Ambulatory Visit: Payer: Self-pay

## 2022-05-28 DIAGNOSIS — R202 Paresthesia of skin: Secondary | ICD-10-CM

## 2022-05-31 ENCOUNTER — Ambulatory Visit (INDEPENDENT_AMBULATORY_CARE_PROVIDER_SITE_OTHER): Payer: 59 | Admitting: Neurology

## 2022-05-31 DIAGNOSIS — R202 Paresthesia of skin: Secondary | ICD-10-CM | POA: Diagnosis not present

## 2022-05-31 NOTE — Procedures (Signed)
  [facility] Neurology  [address] [place], [address]  [place], [place] [address] Tel: [phone] Fax: [phone] Test Date:  05/31/2022  Patient: Laurie Andrews DOB: 1971/01/06 Physician: Jacquelyne Balint, MD  Sex: Female Height:  Ref Phys: Gershon Mussel, MD  ID#: [id number]   Technician:    History: This is a 52 year old female with pain in left lower limb.  NCV & EMG Findings: Electrodiagnostic evaluation was limited to nerve conduction studies and was terminated early by pain due to severe discomfort, being unable to lay on table. The limited findings show: Left sural and superficial peroneal/fibular sensory responses are within normal limits. Left peroneal/fibular (EDB) and tibial (AH) motor responses are within normal limits.  Impression: This limited evaluation showed no abnormalities. Nerve conduction studies of the left lower limb are within normal limits. Patient was unable to tolerate further evaluation, including needle examination, due to pain.    ___________________________ Jacquelyne Balint, MD    Nerve Conduction Studies Motor Nerve Results    Latency Amplitude F-Lat Segment Distance CV Comment  Site (ms) Norm (mV) Norm (ms)  (cm) (m/s) Norm   Left Fibular (EDB) Motor  Ankle 3.3  < 6.0 4.3  > 2.5        Bel fib head 10.2 - 3.7 -  Bel fib head-Ankle 29 42  > 40   Pop fossa 11.8 - 3.7 -  Pop fossa-Bel fib head 8 50 -   Left Tibial (AH) Motor  Ankle 3.9  < 6.0 4.7  > 4.0        Knee 11.7 - 2.4 -  Knee-Ankle 37 47  > 40    Sensory Sites    Neg Peak Lat Amplitude (O-P) Segment Distance Velocity Comment  Site (ms) Norm (V) Norm  (cm) (ms)   Left Superficial Fibular Sensory  14 cm-Ankle 3.0  < 4.6 5  > 4 14 cm-Ankle 14    Left Sural Sensory  Calf-Lat mall 3.8  < 4.6 4  > 4 Calf-Lat mall 14        Waveforms:  Motor      Sensory

## 2022-06-01 DIAGNOSIS — R262 Difficulty in walking, not elsewhere classified: Secondary | ICD-10-CM | POA: Diagnosis not present

## 2022-06-01 DIAGNOSIS — M79669 Pain in unspecified lower leg: Secondary | ICD-10-CM | POA: Diagnosis not present

## 2022-06-02 ENCOUNTER — Telehealth: Payer: Self-pay | Admitting: Internal Medicine

## 2022-06-02 NOTE — Telephone Encounter (Signed)
Letter and notes successfully faxed to [facility] [employer] services on 06/02/2022.

## 2022-06-03 DIAGNOSIS — G894 Chronic pain syndrome: Secondary | ICD-10-CM | POA: Diagnosis not present

## 2022-06-03 DIAGNOSIS — M5431 Sciatica, right side: Secondary | ICD-10-CM | POA: Diagnosis not present

## 2022-06-03 DIAGNOSIS — M1710 Unilateral primary osteoarthritis, unspecified knee: Secondary | ICD-10-CM | POA: Diagnosis not present

## 2022-06-03 DIAGNOSIS — M25562 Pain in left knee: Secondary | ICD-10-CM | POA: Diagnosis not present

## 2022-06-03 DIAGNOSIS — M25561 Pain in right knee: Secondary | ICD-10-CM | POA: Diagnosis not present

## 2022-06-03 DIAGNOSIS — Z79891 Long term (current) use of opiate analgesic: Secondary | ICD-10-CM | POA: Diagnosis not present

## 2022-06-03 DIAGNOSIS — M545 Low back pain, unspecified: Secondary | ICD-10-CM | POA: Diagnosis not present

## 2022-06-03 DIAGNOSIS — G8929 Other chronic pain: Secondary | ICD-10-CM | POA: Diagnosis not present

## 2022-06-05 ENCOUNTER — Other Ambulatory Visit: Payer: Self-pay | Admitting: Internal Medicine

## 2022-06-05 DIAGNOSIS — E1169 Type 2 diabetes mellitus with other specified complication: Secondary | ICD-10-CM

## 2022-06-06 ENCOUNTER — Other Ambulatory Visit: Payer: Self-pay | Admitting: Internal Medicine

## 2022-06-06 DIAGNOSIS — J4540 Moderate persistent asthma, uncomplicated: Secondary | ICD-10-CM

## 2022-06-11 ENCOUNTER — Other Ambulatory Visit: Payer: Self-pay | Admitting: Internal Medicine

## 2022-06-17 ENCOUNTER — Other Ambulatory Visit: Payer: Self-pay | Admitting: Internal Medicine

## 2022-06-17 ENCOUNTER — Other Ambulatory Visit: Payer: Self-pay | Admitting: Pharmacist

## 2022-06-17 ENCOUNTER — Ambulatory Visit: Payer: 59 | Attending: Family Medicine | Admitting: Pharmacist

## 2022-06-17 ENCOUNTER — Other Ambulatory Visit: Payer: Self-pay

## 2022-06-17 DIAGNOSIS — E1169 Type 2 diabetes mellitus with other specified complication: Secondary | ICD-10-CM | POA: Diagnosis not present

## 2022-06-17 DIAGNOSIS — Z7985 Long-term (current) use of injectable non-insulin antidiabetic drugs: Secondary | ICD-10-CM | POA: Diagnosis not present

## 2022-06-17 DIAGNOSIS — Z794 Long term (current) use of insulin: Secondary | ICD-10-CM

## 2022-06-17 DIAGNOSIS — J069 Acute upper respiratory infection, unspecified: Secondary | ICD-10-CM | POA: Diagnosis not present

## 2022-06-17 DIAGNOSIS — E669 Obesity, unspecified: Secondary | ICD-10-CM

## 2022-06-17 DIAGNOSIS — B9689 Other specified bacterial agents as the cause of diseases classified elsewhere: Secondary | ICD-10-CM | POA: Diagnosis not present

## 2022-06-17 MED ORDER — BENZONATATE 100 MG PO CAPS
100.0000 mg | ORAL_CAPSULE | Freq: Three times a day (TID) | ORAL | 0 refills | Status: DC | PRN
  Filled 2022-06-17: qty 30, 10d supply, fill #0

## 2022-06-17 MED ORDER — OZEMPIC (0.25 OR 0.5 MG/DOSE) 2 MG/3ML ~~LOC~~ SOPN: 0.5000 mg | PEN_INJECTOR | SUBCUTANEOUS | 1 refills | Status: DC

## 2022-06-17 MED ORDER — TOUJEO SOLOSTAR 300 UNIT/ML ~~LOC~~ SOPN: 38.0000 [IU] | PEN_INJECTOR | Freq: Every day | SUBCUTANEOUS | 3 refills | Status: DC

## 2022-06-17 MED ORDER — PREDNISONE 5 MG PO TABS
ORAL_TABLET | ORAL | 0 refills | Status: AC
  Filled 2022-06-17: qty 21, 6d supply, fill #0

## 2022-06-17 MED ORDER — LANTUS SOLOSTAR 100 UNIT/ML ~~LOC~~ SOPN: 38.0000 [IU] | PEN_INJECTOR | Freq: Every day | SUBCUTANEOUS | 2 refills | Status: DC

## 2022-06-17 NOTE — Telephone Encounter (Signed)
Requested medication (s) are due for refill today:   Rx written today by Dr. Laural Benes  (Also saw [facility])  Requested medication (s) are on the active medication list:   Yes  Future visit scheduled:   Yes 07/16/2022 with Dr. Laural Benes   Last ordered: Today 06/17/2022 15 ml, 2 refills  Returned because insurance will not cover this.   See message for alternatives that are suggested.   Requested Prescriptions  Pending Prescriptions Disp Refills   LANTUS SOLOSTAR 100 UNIT/ML Solostar Pen [Pharmacy Med Name: LANTUS SOLOSTAR 100 UNIT/ML]  0     Endocrinology:  Diabetes - Insulins Failed - 06/17/2022 10:22 AM      Failed - HBA1C is between 0 and 7.9 and within 180 days    HbA1c, POC (controlled diabetic range)  Date Value Ref Range Status  04/13/2022 13.3 (A) 0.0 - 7.0 % Final         Passed - Valid encounter within last 6 months    Recent Outpatient Visits           Today Type 2 diabetes mellitus with obesity [facility])   [place] [facility] [place], [place] L, RPH-CPP   1 month ago Type 2 diabetes mellitus with obesity [facility])   [place] [facility] Center [place], [place] L, RPH-CPP   2 months ago Type 2 diabetes mellitus with hyperglycemia, with long-term current use of insulin [facility])   [place] [facility] Jonah Blue B, MD   2 months ago Type 2 diabetes mellitus with obesity [facility])   [place] [facility] Center [place], [place] L, RPH-CPP   3 months ago Type 2 diabetes mellitus with obesity [facility])   [place] [facility] Center [place], Cornelius Moras, RPH-CPP       Future Appointments             In 4 weeks Laural Benes, Binnie Rail, MD [facility]   In 2 months Lois Huxley, Cornelius Moras, RPH-CPP [place] [facility]

## 2022-06-17 NOTE — Progress Notes (Signed)
S:    PCP: Dr. Laural Benes  52 y.o. female who presents for diabetes evaluation, education, and management.  PMH is significant for 2DM w/ peripheral neuropathy, HTN, opiate dependence, anxiety disorder, asthma, migraines, OA knees, alopecia. Patient was referred by Primary Care Provider, Dr. Laural Benes, on 04/15/2022. Last seen by pharmacy clinic on 05/17/2022. We adjusted her insulin and started Ozempic.  Today, patient arrives in good spirits and presents without any assistance. Patient has FreeStyle Libre 3 CGM in place. Her CGM data is summarized below. Control is somewhat improved since last visit, however, still above goal. Patient needs an A1c of 7% for knee replacement surgery. Since her last visit, she has tolerated the Ozempic okay. Denies any NV, abdominal pain since starting it last month.  Current diabetes medications include: Lantus 30 units daily, Humalog 11/21/12 units TID, Ozempic 0.25 mg weekly  Patient reports adherence to taking all medications as prescribed.   Insurance coverage: UHC Medicare + Medicaid   Patient denies hypoglycemic events since last visit.   Reported home fasting blood sugars: see CGM data below   Patient reports nocturia (nighttime urination).  Patient reports neuropathy (nerve pain). Patient denies visual changes. Patient reports self foot exams.  Patient reported  Breakfast: glucose shake Lunch: tuna fish sandwich, 2 piece of bread Dinner: baked chicken, rice (1 cup), veggies   Snacks: reports not snacking Beverages: water, coffee (sugar creamer)  Exercise: walks around complex (15-20 mins) 3 times a week; starting physical therapy   O:  Date of Download: 05/04/2022 to 05/17/2022 % Time CGM is active: 93% Average Glucose: 251 mg/dL (down from 440) Glucose Management Indicator: 9.3%  Glucose Variability: 29% (goal <36%) Time in Goal:  - Time in range 70-180: 18% - Time above range: 82% - Time below range: 0% Observed patterns: -Tends to run  lower in the overnight, early morning  -Higher trends (epikes occur around 12p an 6-9p)   Lab Results  Component Value Date   HGBA1C 13.3 (A) 04/13/2022   There were no vitals filed for this visit.  Lipid Panel     Component Value Date/Time   CHOL 177 07/24/2021 1501   TRIG 259 (H) 07/24/2021 1501   HDL 46 07/24/2021 1501   CHOLHDL 3.8 07/24/2021 1501   LDLCALC 88 07/24/2021 1501    Clinical Atherosclerotic Cardiovascular Disease (ASCVD): No  The 10-year ASCVD risk score (Arnett DK, et al., 2019) is: 16.4%   Values used to calculate the score:     Age: 50 years     Sex: Female     Is Non-Hispanic African American: Yes     Diabetic: Yes     Tobacco smoker: No     Systolic Blood Pressure: 144 mmHg     Is BP treated: Yes     HDL Cholesterol: 46 mg/dL     Total Cholesterol: 177 mg/dL    A/P: Diabetes longstanding, currently above goal based on A1c and home CGM data. Patient is able to verbalize appropriate hypoglycemia management plan. Medication adherence appears appropriate. -Increase Lantus to 38 units every morning.  -Continue Humalog 10 units with breakfast and 14 units with lunch and supper. -Increase Ozempic to 0.5 mg weekly. -Defer initiation of SGLT2-I  until A1c closer to goal.  -Intolerant to metformin. -Patient educated on purpose, proper use, and potential adverse effects of insulin.  -Extensively discussed pathophysiology of diabetes, recommended lifestyle interventions, dietary effects on blood sugar control.  -Counseled on s/sx of and management of hypoglycemia.  -  Next A1c anticipated June 2024.   Written patient instructions provided. Patient verbalized understanding of treatment plan.  Total time in face to face counseling 30 minutes.    Follow-up:  Pharmacist: 2 months PCP visit: June 2024  Butch Penny, PharmD, P[place]y [place], CPP Clinical Pharmacist [facility] [phone]

## 2022-06-21 ENCOUNTER — Telehealth: Payer: Self-pay | Admitting: Pharmacist

## 2022-06-21 NOTE — Telephone Encounter (Signed)
Can we start a PA for this patient's Toujeo? She is starting to use a higher dose of insulin and requests a pen that will last longer.

## 2022-06-23 ENCOUNTER — Other Ambulatory Visit: Payer: Self-pay

## 2022-07-01 DIAGNOSIS — M79669 Pain in unspecified lower leg: Secondary | ICD-10-CM | POA: Diagnosis not present

## 2022-07-01 DIAGNOSIS — Z794 Long term (current) use of insulin: Secondary | ICD-10-CM | POA: Diagnosis not present

## 2022-07-01 DIAGNOSIS — R262 Difficulty in walking, not elsewhere classified: Secondary | ICD-10-CM | POA: Diagnosis not present

## 2022-07-01 DIAGNOSIS — H40023 Open angle with borderline findings, high risk, bilateral: Secondary | ICD-10-CM | POA: Diagnosis not present

## 2022-07-03 ENCOUNTER — Other Ambulatory Visit: Payer: Self-pay | Admitting: Internal Medicine

## 2022-07-03 DIAGNOSIS — J4540 Moderate persistent asthma, uncomplicated: Secondary | ICD-10-CM

## 2022-07-06 NOTE — Telephone Encounter (Signed)
Requested Prescriptions  Pending Prescriptions Disp Refills   WIXELA INHUB 100-50 MCG/ACT AEPB [Pharmacy Med Name: WIXELA 100-50 INHUB] 60 each 2    Sig: INHALE 1 PUFF INTO THE LUNGS TWICE A DAY     Pulmonology:  Combination Products Passed - 07/03/2022 12:13 AM      Passed - Valid encounter within last 12 months    Recent Outpatient Visits           2 weeks ago Type 2 diabetes mellitus with obesity [facility])   [place] [facility] Center [place], [place] L, RPH-CPP   1 month ago Type 2 diabetes mellitus with obesity [facility])   [place] [facility] [place], [place] L, RPH-CPP   2 months ago Type 2 diabetes mellitus with hyperglycemia, with long-term current use of insulin [facility])   [place] [facility] Jonah Blue B, MD   2 months ago Type 2 diabetes mellitus with obesity [facility])   [place] [facility] [place], [place] L, RPH-CPP   3 months ago Type 2 diabetes mellitus with obesity [facility])   [place] [facility] Center [place], Cornelius Moras, RPH-CPP       Future Appointments             In 1 week Marcine Matar, MD [facility]   In 1 month Lois Huxley, Cornelius Moras, RPH-CPP [facility]

## 2022-07-08 ENCOUNTER — Other Ambulatory Visit: Payer: Self-pay | Admitting: Internal Medicine

## 2022-07-08 DIAGNOSIS — Z8669 Personal history of other diseases of the nervous system and sense organs: Secondary | ICD-10-CM

## 2022-07-08 DIAGNOSIS — E1159 Type 2 diabetes mellitus with other circulatory complications: Secondary | ICD-10-CM

## 2022-07-08 NOTE — Telephone Encounter (Signed)
Requested Prescriptions  Pending Prescriptions Disp Refills   amLODipine (NORVASC) 10 MG tablet [Pharmacy Med Name: AMLODIPINE BESYLATE 10 MG TAB] 90 tablet 0    Sig: TAKE 1 TABLET BY MOUTH EVERY DAY     Cardiovascular: Calcium Channel Blockers 2 Failed - 07/08/2022 12:16 AM      Failed - Last BP in normal range    BP Readings from Last 1 Encounters:  04/15/22 (!) 144/79         Passed - Last Heart Rate in normal range    Pulse Readings from Last 1 Encounters:  04/15/22 79         Passed - Valid encounter within last 6 months    Recent Outpatient Visits           3 weeks ago Type 2 diabetes mellitus with obesity (HCC)   [place] [facility] [place], [place] L, RPH-CPP   1 month ago Type 2 diabetes mellitus with obesity [facility])   [place] [facility] Center [place], [place] L, RPH-CPP   2 months ago Type 2 diabetes mellitus with hyperglycemia, with long-term current use of insulin [facility])   [place] [facility] & [facility] Jonah Blue B, MD   2 months ago Type 2 diabetes mellitus with obesity [facility])   [place] [facility] & [facility]ness Center [place], [place] L, RPH-CPP   3 months ago Type 2 diabetes mellitus with obesity [facility])   [place] [facility] [place]illa [place], RPH-CPP       Future Appointments             In 1 week Marcine Matar, MD [facility]   In 1 month Lois Huxley, Cornelius Moras, RPH-CPP [place] [facility]             naratriptan (AMERGE) 2.5 MG tablet [Pharmacy Med Name: NARATRIPTAN HCL 2.5 MG TABLET] 10 tablet 1    Sig: TAKE 1 TABLET BY MOUTH AS NEEDED FOR MIGRAINE.     Neurology:  Migraine Therapy - Triptan - naratriptan Failed - 07/08/2022 12:16 AM      Failed - Last BP in normal range    BP Readings from Last 1 Encounters:  04/15/22 (!) 144/79         Passed - Cr in normal range  and within 360 days    Creatinine, Ser  Date Value Ref Range Status  07/24/2021 0.78 0.57 - 1.00 mg/dL Final         Passed - AST in normal range and within 360 days    AST  Date Value Ref Range Status  07/24/2021 15 0 - 40 IU/L Final         Passed - ALT in normal range and within 360 days    ALT  Date Value Ref Range Status  07/24/2021 26 0 - 32 IU/L Final         Passed - eGFR is 15 or above and within 360 days    GFR calc Af Amer  Date Value Ref Range Status  09/07/2019 >60 >60 mL/min Final   GFR, Estimated  Date Value Ref Range Status  04/09/2020 >60 >60 mL/min Final    Comment:    (NOTE) Calculated using the CKD-EPI Creatinine Equation (2021)    eGFR  Date Value Ref Range Status  07/24/2021 91 >59 mL/min/1.73 Final  Passed - Valid encounter within last 12 months    Recent Outpatient Visits           3 weeks ago Type 2 diabetes mellitus with obesity [facility])   [place] [facility] [place], [place] L, RPH-CPP   1 month ago Type 2 diabetes mellitus with [facility])   [place] [facility] & [facility]ness Center [place], [place] L, RPH-CPP   2 months ago Type 2 diabetes mellitus with hyperglycemia, with long-term current use of insulin [facility])   [place] [facility] Jonah Blue B, MD   2 months ago Type 2 diabetes mellitus with obesity [facility])   [place]ness Center [place], [place] L, RPH-CPP   3 months ago Type 2 diabetes mellitus with obesity [facility])    [facility] Center Drucilla Chalet, RPH-CPP       Future Appointments             In 1 week Marcine Matar, MD [facility]   In 1 month Lois Huxley, Cornelius Moras, RPH-CPP [facility]

## 2022-07-09 DIAGNOSIS — M25561 Pain in right knee: Secondary | ICD-10-CM | POA: Diagnosis not present

## 2022-07-09 DIAGNOSIS — M25562 Pain in left knee: Secondary | ICD-10-CM | POA: Diagnosis not present

## 2022-07-09 DIAGNOSIS — Z79891 Long term (current) use of opiate analgesic: Secondary | ICD-10-CM | POA: Diagnosis not present

## 2022-07-09 DIAGNOSIS — G8929 Other chronic pain: Secondary | ICD-10-CM | POA: Diagnosis not present

## 2022-07-09 DIAGNOSIS — M1710 Unilateral primary osteoarthritis, unspecified knee: Secondary | ICD-10-CM | POA: Diagnosis not present

## 2022-07-09 DIAGNOSIS — G894 Chronic pain syndrome: Secondary | ICD-10-CM | POA: Diagnosis not present

## 2022-07-09 DIAGNOSIS — Z79899 Other long term (current) drug therapy: Secondary | ICD-10-CM | POA: Diagnosis not present

## 2022-07-09 DIAGNOSIS — M5431 Sciatica, right side: Secondary | ICD-10-CM | POA: Diagnosis not present

## 2022-07-09 DIAGNOSIS — M545 Low back pain, unspecified: Secondary | ICD-10-CM | POA: Diagnosis not present

## 2022-07-15 ENCOUNTER — Other Ambulatory Visit: Payer: Self-pay

## 2022-07-15 NOTE — Progress Notes (Signed)
   Laurie Andrews [birth year] [id number]  Patient outreached by Frederic Jericho , PharmD Candidate on 07/15/22.  Blood Pressure Readings: Last documented ambulatory systolic blood pressure: 144 Last documented ambulatory diastolic blood pressure: 79 Does the patient have a validated home blood pressure machine?: No  Pt stated that she has a prescription for a BP cuff, but doesn't know where she needs to pick it up.  Medication review was performed. Is the patient taking their medications as prescribed?: Yes   The following barriers to adherence were noted: Does the patient have cost concerns?: No Does the patient have transportation concerns?: No Does the patient need assistance obtaining refills?: No Does the patient occassionally forget to take some of their prescribed medications?: Yes Does the patient feel like one/some of their medications make them feel poorly?: No Does the patient have questions or concerns about their medications?: No Does the patient have a follow up scheduled with their primary care provider/cardiologist?: Yes   Interventions: Interventions Completed: Medications were reviewed, Patient was educated on goal blood pressures and long term health implications of elevated blood pressure, Patient was educated on medications, including indication and administration, Patient was educated on how to access home blood pressure machine  The patient has follow up scheduled:  PCP: Marcine Matar, MD   Frederic Jericho, Student-PharmD

## 2022-07-16 ENCOUNTER — Ambulatory Visit: Payer: Self-pay | Admitting: Internal Medicine

## 2022-07-24 ENCOUNTER — Other Ambulatory Visit: Payer: Self-pay | Admitting: Internal Medicine

## 2022-07-26 ENCOUNTER — Other Ambulatory Visit: Payer: Self-pay | Admitting: Internal Medicine

## 2022-07-26 DIAGNOSIS — J4540 Moderate persistent asthma, uncomplicated: Secondary | ICD-10-CM

## 2022-07-26 NOTE — Telephone Encounter (Signed)
Requested Prescriptions  Pending Prescriptions Disp Refills   albuterol (VENTOLIN HFA) 108 (90 Base) MCG/ACT inhaler [Pharmacy Med Name: ALBUTEROL HFA (PROAIR) INHALER] 25.5 each 1    Sig: INHALE 1-2 PUFFS INTO THE LUNGS DAILY AS NEEDED FOR WHEEZING OR SHORTNESS OF BREATH.     Pulmonology:  Beta Agonists 2 Failed - 07/24/2022  8:24 AM      Failed - Last BP in normal range    BP Readings from Last 1 Encounters:  04/15/22 (!) 144/79         Passed - Last Heart Rate in normal range    Pulse Readings from Last 1 Encounters:  04/15/22 79         Passed - Valid encounter within last 12 months    Recent Outpatient Visits           1 month ago Type 2 diabetes mellitus with obesity [facility])   [place] [facility] Center [place], Mer [place] L, RPH-CPP   2 months ago Type 2 diabetes mellitus with obesity [facility])   [place] [facility] & Wellness Center [place], Terl[place] L, RPH-CPP   3 months ago Type 2 diabetes mellitus with hyperglycemia, with long-term current use of insulin [facility] [place])   [place] [facility] Jonah Blue B, MD   3 months ago Type 2 diabetes mellitus with obesity [facility])   [place] [facility] & Wellness Center [place], [employer] L, RPH-CPP   4 months ago Type 2 diabetes mellitus with obesity [facility])   [place] [facility] & [facility]ness Center Drucilla Chalet, RPH-CPP       Future Appointments             In 3 weeks Lois Huxley, Cornelius Moras, RPH-CPP [facility]   In 2 months Laural Benes, Binnie Rail, MD [facility] & [facility]

## 2022-07-26 NOTE — Telephone Encounter (Unsigned)
Copied from CRM [id number]. Topic: General - Other >> Jul 26, 2022  9:37 AM Everette C wrote: Reason for CRM: Medication Refill - Medication: albuterol (VENTOLIN HFA) 108 (90 Base) MCG/ACT inhaler [[id number]]  albuterol (PROVENTIL) (2.5 MG/3ML) 0.083% nebulizer solution [[id number]]  Has the patient contacted their pharmacy? Yes.   (Agent: If no, request that the patient contact the pharmacy for the refill. If patient does not wish to contact the pharmacy document the reason why and proceed with request.) (Agent: If yes, when and what did the pharmacy advise?)  Preferred Pharmacy (with phone number or street name): [id number] [place] - [address] AT [facility] [address]  [place] [address] Phone: [phone] Fax: [phone] Hours: Open 24 hours   Has the patient been seen for an appointment in the last year OR does the patient have an upcoming appointment? Yes.    Agent: Please be advised that RX refills may take up to 3 business days. We ask that you follow-up with your pharmacy.

## 2022-07-27 MED ORDER — ALBUTEROL SULFATE (2.5 MG/3ML) 0.083% IN NEBU
INHALATION_SOLUTION | RESPIRATORY_TRACT | 0 refills | Status: DC
Start: 2022-07-27 — End: 2023-02-08

## 2022-07-27 NOTE — Telephone Encounter (Signed)
Requested medications are due for refill today.  yes  Requested medications are on the active medications list.  yes  Last refill. 02/02/2022  Future visit scheduled.   yes  Notes to clinic.  Please provide sig for medication.    Requested Prescriptions  Pending Prescriptions Disp Refills   albuterol (PROVENTIL) (2.5 MG/3ML) 0.083% nebulizer solution 150 mL 0     Pulmonology:  Beta Agonists 2 Failed - 07/26/2022 10:10 AM      Failed - Last BP in normal range    BP Readings from Last 1 Encounters:  04/15/22 (!) 144/79         Passed - Last Heart Rate in normal range    Pulse Readings from Last 1 Encounters:  04/15/22 79         Passed - Valid encounter within last 12 months    Recent Outpatient Visits           1 month ago Type 2 diabetes mellitus with obesity [facility])   [place] [facility] & Wellness Center [place], [place] L, RPH-CPP   2 months ago Type 2 diabetes mellitus with obesity [facility])   [place] [facility]ness Center [place], [place] L, RPH-CPP   3 months ago Type 2 diabetes mellitus with hyperglycemia, with long-term current use of insulin [facility])   [place] [facility] Jonah Blue B, MD   3 months ago Type 2 diabetes mellitus with obesity [facility])   [place] [facility] Wellness Center [place], Laurium L, RPH-CPP   4 months ago Type 2 diabetes mellitus with obesity [facility])   [place] [facility] Center Drucilla Chalet, RPH-CPP       Future Appointments             In 3 weeks Lois Huxley, Cornelius Moras, RPH-CPP [place] [facility]   In 2 months Marcine Matar, MD [place] [facility]            Refused Prescriptions Disp Refills   albuterol (VENTOLIN HFA) 108 (90 Base) MCG/ACT inhaler 25.5 each 0    Sig: Inhale 1-2 puffs into the lungs daily as needed for wheezing or shortness of breath.     Pulmonology:   Beta Agonists 2 Failed - 07/26/2022 10:10 AM      Failed - Last BP in normal range    BP Readings from Last 1 Encounters:  04/15/22 (!) 144/79         Passed - Last Heart Rate in normal range    Pulse Readings from Last 1 Encounters:  04/15/22 79         Passed - Valid encounter within last 12 months    Recent Outpatient Visits           1 month ago Type 2 diabetes mellitus with obesity [facility])   [place] [facility] & Wellness Center [place], [place] L, RPH-CPP   2 months ago Type 2 diabetes mellitus with obesity [facility])   [place] [facility] & Wellness Center [place], [place] L, RPH-CPP   3 months ago Type 2 diabetes mellitus with hyperglycemia, with long-term current use of insulin [facility])    [place] [facility] Jonah Blue B, MD   3 months ago Type 2 diabetes mellitus with obesity [facility])   [facility] & Wellness Center [place], Cornelius Moras, RPH-CPP  4 months ago Type 2 diabetes mellitus with obesity [facility])   [place] [facility] & Wellness Center [place], Cornelius Moras, RPH-CPP       Future Appointments             In 3 weeks Lois Huxley, Cornelius Moras, RPH-CPP [place] [facility]   In 2 months Laural Benes, Binnie Rail, MD [facility] & [facility]

## 2022-07-27 NOTE — Telephone Encounter (Signed)
Requested Prescriptions  Pending Prescriptions Disp Refills   albuterol (PROVENTIL) (2.5 MG/3ML) 0.083% nebulizer solution 150 mL 0     Pulmonology:  Beta Agonists 2 Failed - 07/26/2022 10:10 AM      Failed - Last BP in normal range    BP Readings from Last 1 Encounters:  04/15/22 (!) 144/79         Passed - Last Heart Rate in normal range    Pulse Readings from Last 1 Encounters:  04/15/22 79         Passed - Valid encounter within last 12 months    Recent Outpatient Visits           1 month ago Type 2 diabetes mellitus with obesity [facility])   [facility] Center [place], [place] L, RPH-CPP   2 months ago Type 2 diabetes mellitus with obesity [facility])   [place] [facility] [place], Samak L, RPH-CPP   3 months ago Type 2 diabetes mellitus with hyperglycemia, with long-term current use of insulin [facility])   [place] [facility] Jonah Blue B, MD   3 months ago Type 2 diabetes mellitus with obesity [facility])   [place] [facility] [place], [place] L, RPH-CPP   4 months ago Type 2 diabetes mellitus with obesity [facility])   [place] [facility] & Wellness Center Drucilla Chalet, RPH-CPP       Future Appointments             In 3 weeks Lois Huxley, Cornelius Moras, RPH-CPP [place] [facility]   In 2 months Marcine Matar, MD [place] [facility]             albuterol (VENTOLIN HFA) 108 (90 Base) MCG/ACT inhaler 25.5 each 0    Sig: Inhale 1-2 puffs into the lungs daily as needed for wheezing or shortness of breath.     Pulmonology:  Beta Agonists 2 Failed - 07/26/2022 10:10 AM      Failed - Last BP in normal range    BP Readings from Last 1 Encounters:  04/15/22 (!) 144/79         Passed - Last Heart Rate in normal range    Pulse Readings from Last 1 Encounters:  04/15/22 79         Passed -  Valid encounter within last 12 months    Recent Outpatient Visits           1 month ago Type 2 diabetes mellitus with obesity [facility])   [place] [facility] & Wellness Center [place], [place] L, RPH-CPP   2 months ago Type 2 diabetes mellitus with obesity [facility])   [place] [facility] & Wellness Center [place], [place] L, RPH-CPP   3 months ago Type 2 diabetes mellitus with hyperglycemia, with long-term current use of insulin [facility])   [place] [facility] Jonah Blue B, MD   3 months ago Type 2 diabetes mellitus with obesity [facility])   [place] [facility] & Wellness Center [place], [place] L, RPH-CPP   4 months ago Type 2 diabetes mellitus with obesity [facility])   [facility] & Wellness Center [place], Cornelius Moras, RPH-CPP       Future Appointments             In 3 weeks  Drucilla Chalet, RPH-CPP [place] [facility]   In 2 months Laural Benes, Binnie Rail, MD [facility] & [facility]

## 2022-07-30 ENCOUNTER — Other Ambulatory Visit: Payer: Self-pay | Admitting: Internal Medicine

## 2022-07-30 DIAGNOSIS — E1165 Type 2 diabetes mellitus with hyperglycemia: Secondary | ICD-10-CM

## 2022-07-30 DIAGNOSIS — I152 Hypertension secondary to endocrine disorders: Secondary | ICD-10-CM

## 2022-07-30 MED ORDER — FREESTYLE LIBRE 3 SENSOR MISC
3 refills | Status: DC
Start: 2022-07-30 — End: 2022-11-19

## 2022-07-30 NOTE — Telephone Encounter (Signed)
[facility] patient local pharmacy to confirm that there were no more refills on the [employer]. [facility] confirmed there a no [facility]. Refills will be sent to out of states pharmacy, due to patient out of town.

## 2022-07-30 NOTE — Telephone Encounter (Signed)
Medication Refill - Medication: Continuous Blood Gluc Sensor (FREESTYLE LIBRE 3 SENSOR) MISC   Has the patient contacted their pharmacy? No.   Preferred Pharmacy (with phone number or street name):  [id number] [place] [place] - [address] Phone: [phone]  Fax: [phone]      Has the patient been seen for an appointment in the last year OR does the patient have an upcoming appointment? Yes.    The patient is out of sensors and she is on vacation currently. Please assist patient further

## 2022-07-30 NOTE — Telephone Encounter (Signed)
Unable to refill per protocol, Rx expired. Discontinued 01/15/22, dose change.  Requested Prescriptions  Pending Prescriptions Disp Refills   amLODipine (NORVASC) 5 MG tablet [Pharmacy Med Name: AMLODIPINE BESYLATE 5 MG TAB] 90 tablet 2    Sig: TAKE 1 TABLET (5 MG TOTAL) BY MOUTH DAILY.     Cardiovascular: Calcium Channel Blockers 2 Failed - 07/30/2022 12:11 AM      Failed - Last BP in normal range    BP Readings from Last 1 Encounters:  04/15/22 (!) 144/79         Passed - Last Heart Rate in normal range    Pulse Readings from Last 1 Encounters:  04/15/22 79         Passed - Valid encounter within last 6 months    Recent Outpatient Visits           1 month ago Type 2 diabetes mellitus with obesity [facility])   [place] [facility] Wellness Center [place], [place] L, RPH-CPP   2 months ago Type 2 diabetes mellitus with obesity [facility])   [place] [facility] & Wellness Center [place], [place] L, RPH-CPP   3 months ago Type 2 diabetes mellitus with hyperglycemia, with long-term current use of insulin [facility])   [place] [facility] Jonah Blue B, MD   3 months ago Type 2 diabetes mellitus with obesity [facility])   [place] [facility] & Wellness Center [place], [place] L, RPH-CPP   4 months ago Type 2 diabetes mellitus with obesity [facility])   [place] [facility] Wellness Center Drucilla Chalet, RPH-CPP       Future Appointments             In 2 weeks Lois Huxley, Cornelius Moras, RPH-CPP [place] [facility]   In 2 months Laural Benes, Binnie Rail, MD [facility]

## 2022-07-30 NOTE — Telephone Encounter (Signed)
Requested Prescriptions  Pending Prescriptions Disp Refills   Continuous Glucose Sensor (FREESTYLE LIBRE 3 SENSOR) MISC 2 each 3    Sig: USE TO CHECK BLOOD SUGAR THREE TIMES A DAY     Endocrinology: Diabetes - Testing Supplies Passed - 07/30/2022 11:51 AM      Passed - Valid encounter within last 12 months    Recent Outpatient Visits           1 month ago Type 2 diabetes mellitus with obesity [facility])   [place] [facility]ness Center [place], [place] L, RPH-CPP   2 months ago Type 2 diabetes mellitus with obesity [facility])   [place] [facility] & Wellness Center [place], L[place] Lake L, RPH-CPP   3 months ago Type 2 diabetes mellitus with hyperglycemia, with long-term current use of insulin [facility])   [place] [facility] Jonah Blue B, MD   3 months ago Type 2 diabetes mellitus with obesity [facility])   [place] [facility] & Wellness Center [place], [place] L, RPH-CPP   4 months ago Type 2 diabetes mellitus with obesity [facility])   [place] [facility] & [facility] Center Drucilla Chalet, RPH-CPP       Future Appointments             In 2 weeks Lois Huxley, Cornelius Moras, RPH-CPP [place] [facility]   In 2 months Laural Benes, Binnie Rail, MD [facility] Health [facility] & [facility]

## 2022-08-01 DIAGNOSIS — M79669 Pain in unspecified lower leg: Secondary | ICD-10-CM | POA: Diagnosis not present

## 2022-08-01 DIAGNOSIS — R262 Difficulty in walking, not elsewhere classified: Secondary | ICD-10-CM | POA: Diagnosis not present

## 2022-08-05 ENCOUNTER — Other Ambulatory Visit: Payer: Self-pay | Admitting: Internal Medicine

## 2022-08-05 NOTE — Telephone Encounter (Signed)
Requested Prescriptions  Pending Prescriptions Disp Refills   atorvastatin (LIPITOR) 10 MG tablet [Pharmacy Med Name: ATORVASTATIN 10 MG TABLET] 90 tablet 0    Sig: TAKE 1 TABLET BY MOUTH EVERY DAY     Cardiovascular:  Antilipid - Statins Failed - 08/05/2022 12:12 AM      Failed - Lipid Panel in normal range within the last 12 months    Cholesterol, Total  Date Value Ref Range Status  07/24/2021 177 100 - 199 mg/dL Final   LDL Chol Calc (NIH)  Date Value Ref Range Status  07/24/2021 88 0 - 99 mg/dL Final   HDL  Date Value Ref Range Status  07/24/2021 46 >39 mg/dL Final   Triglycerides  Date Value Ref Range Status  07/24/2021 259 (H) 0 - 149 mg/dL Final         Passed - Patient is not pregnant      Passed - Valid encounter within last 12 months    Recent Outpatient Visits           1 month ago Type 2 diabetes mellitus with obesity (HCC)   [place] [facility] & Wellness Center [place], Th[place] L, RPH-CPP   2 months ago Type 2 diabetes mellitus with [facility])   [place] [facility] Wellness Center [place], [place] L, RPH-CPP   3 months ago Type 2 diabetes mellitus with hyperglycemia, with long-term current use of insulin [facility])   [place] [facility] Jonah Blue B, MD   3 months ago Type 2 diabetes mellitus with obesity [facility])   [place] [facility] & Wellness Center [place], [place] L, RPH-CPP   4 months ago Type 2 diabetes mellitus with obesity [facility])   [place] [facility] Center Drucilla Chalet, RPH-CPP       Future Appointments             In 1 week Lois Huxley, Cornelius Moras, RPH-CPP [place] [facility]   In 2 months Laural Benes, Binnie Rail, MD [facility] & [facility]

## 2022-08-17 ENCOUNTER — Ambulatory Visit: Payer: 59 | Admitting: Pharmacist

## 2022-08-19 ENCOUNTER — Ambulatory Visit: Payer: 59 | Admitting: Pharmacist

## 2022-08-20 DIAGNOSIS — Z79891 Long term (current) use of opiate analgesic: Secondary | ICD-10-CM | POA: Diagnosis not present

## 2022-08-20 DIAGNOSIS — M545 Low back pain, unspecified: Secondary | ICD-10-CM | POA: Diagnosis not present

## 2022-08-20 DIAGNOSIS — M5431 Sciatica, right side: Secondary | ICD-10-CM | POA: Diagnosis not present

## 2022-08-20 DIAGNOSIS — M25561 Pain in right knee: Secondary | ICD-10-CM | POA: Diagnosis not present

## 2022-08-20 DIAGNOSIS — Z79899 Other long term (current) drug therapy: Secondary | ICD-10-CM | POA: Diagnosis not present

## 2022-08-20 DIAGNOSIS — M25562 Pain in left knee: Secondary | ICD-10-CM | POA: Diagnosis not present

## 2022-08-20 DIAGNOSIS — G894 Chronic pain syndrome: Secondary | ICD-10-CM | POA: Diagnosis not present

## 2022-08-20 DIAGNOSIS — M1710 Unilateral primary osteoarthritis, unspecified knee: Secondary | ICD-10-CM | POA: Diagnosis not present

## 2022-08-20 DIAGNOSIS — G8929 Other chronic pain: Secondary | ICD-10-CM | POA: Diagnosis not present

## 2022-08-31 DIAGNOSIS — M79669 Pain in unspecified lower leg: Secondary | ICD-10-CM | POA: Diagnosis not present

## 2022-08-31 DIAGNOSIS — R262 Difficulty in walking, not elsewhere classified: Secondary | ICD-10-CM | POA: Diagnosis not present

## 2022-09-03 ENCOUNTER — Emergency Department (HOSPITAL_COMMUNITY)
Admission: EM | Admit: 2022-09-03 | Discharge: 2022-09-04 | Disposition: A | Discharge status: Home / Self Care | Payer: 59 | Attending: Emergency Medicine | Admitting: Emergency Medicine

## 2022-09-03 ENCOUNTER — Encounter: Payer: Self-pay | Admitting: Pharmacist

## 2022-09-03 ENCOUNTER — Encounter (HOSPITAL_COMMUNITY): Payer: Self-pay

## 2022-09-03 ENCOUNTER — Other Ambulatory Visit: Payer: Self-pay

## 2022-09-03 ENCOUNTER — Ambulatory Visit: Payer: 59 | Admitting: Pharmacist

## 2022-09-03 ENCOUNTER — Ambulatory Visit (HOSPITAL_COMMUNITY)
Admission: EM | Admit: 2022-09-03 | Discharge: 2022-09-03 | Disposition: A | Discharge status: Home / Self Care | Payer: 59

## 2022-09-03 ENCOUNTER — Emergency Department (HOSPITAL_COMMUNITY): Payer: 59

## 2022-09-03 DIAGNOSIS — Z7951 Long term (current) use of inhaled steroids: Secondary | ICD-10-CM | POA: Diagnosis not present

## 2022-09-03 DIAGNOSIS — E1165 Type 2 diabetes mellitus with hyperglycemia: Secondary | ICD-10-CM | POA: Diagnosis not present

## 2022-09-03 DIAGNOSIS — R1010 Upper abdominal pain, unspecified: Secondary | ICD-10-CM

## 2022-09-03 DIAGNOSIS — I10 Essential (primary) hypertension: Secondary | ICD-10-CM | POA: Diagnosis not present

## 2022-09-03 DIAGNOSIS — K449 Diaphragmatic hernia without obstruction or gangrene: Secondary | ICD-10-CM | POA: Diagnosis not present

## 2022-09-03 DIAGNOSIS — Z794 Long term (current) use of insulin: Secondary | ICD-10-CM | POA: Diagnosis not present

## 2022-09-03 DIAGNOSIS — R197 Diarrhea, unspecified: Secondary | ICD-10-CM | POA: Diagnosis not present

## 2022-09-03 DIAGNOSIS — R112 Nausea with vomiting, unspecified: Secondary | ICD-10-CM

## 2022-09-03 DIAGNOSIS — Z79899 Other long term (current) drug therapy: Secondary | ICD-10-CM | POA: Insufficient documentation

## 2022-09-03 DIAGNOSIS — J45909 Unspecified asthma, uncomplicated: Secondary | ICD-10-CM | POA: Diagnosis not present

## 2022-09-03 DIAGNOSIS — Z7985 Long-term (current) use of injectable non-insulin antidiabetic drugs: Secondary | ICD-10-CM

## 2022-09-03 DIAGNOSIS — K760 Fatty (change of) liver, not elsewhere classified: Secondary | ICD-10-CM | POA: Diagnosis not present

## 2022-09-03 DIAGNOSIS — R739 Hyperglycemia, unspecified: Secondary | ICD-10-CM

## 2022-09-03 LAB — CBC WITH DIFFERENTIAL/PLATELET
Abs Immature Granulocytes: 0.02 10*3/uL (ref 0.00–0.07)
Basophils Absolute: 0 10*3/uL (ref 0.0–0.1)
Basophils Relative: 1 %
Eosinophils Absolute: 0.1 10*3/uL (ref 0.0–0.5)
Eosinophils Relative: 1 %
HCT: 39.4 % (ref 36.0–46.0)
Hemoglobin: 12.4 g/dL (ref 12.0–15.0)
Immature Granulocytes: 0 %
Lymphocytes Relative: 34 %
Lymphs Abs: 2.5 10*3/uL (ref 0.7–4.0)
MCH: 25.7 pg — ABNORMAL LOW (ref 26.0–34.0)
MCHC: 31.5 g/dL (ref 30.0–36.0)
MCV: 81.7 fL (ref 80.0–100.0)
Monocytes Absolute: 0.6 10*3/uL (ref 0.1–1.0)
Monocytes Relative: 8 %
Neutro Abs: 4.1 10*3/uL (ref 1.7–7.7)
Neutrophils Relative %: 56 %
Platelets: 337 10*3/uL (ref 150–400)
RBC: 4.82 MIL/uL (ref 3.87–5.11)
RDW: 12.4 % (ref 11.5–15.5)
WBC: 7.3 10*3/uL (ref 4.0–10.5)
nRBC: 0 % (ref 0.0–0.2)

## 2022-09-03 LAB — COMPREHENSIVE METABOLIC PANEL
ALT: 20 U/L (ref 0–44)
AST: 16 U/L (ref 15–41)
Albumin: 3.7 g/dL (ref 3.5–5.0)
Alkaline Phosphatase: 132 U/L — ABNORMAL HIGH (ref 38–126)
Anion gap: 10 (ref 5–15)
BUN: 10 mg/dL (ref 6–20)
CO2: 23 mmol/L (ref 22–32)
Calcium: 9 mg/dL (ref 8.9–10.3)
Chloride: 99 mmol/L (ref 98–111)
Creatinine, Ser: 0.87 mg/dL (ref 0.44–1.00)
GFR, Estimated: >60 mL/min (ref >60)
Glucose, Bld: 382 mg/dL — ABNORMAL HIGH (ref 70–99)
Potassium: 4.3 mmol/L (ref 3.5–5.1)
Sodium: 132 mmol/L — ABNORMAL LOW (ref 135–145)
Total Bilirubin: 0.4 mg/dL (ref 0.3–1.2)
Total Protein: 7.1 g/dL (ref 6.5–8.1)

## 2022-09-03 LAB — CBG MONITORING, ED
Glucose-Capillary: 449 mg/dL — ABNORMAL HIGH (ref 70–99)
Glucose-Capillary: 233 mg/dL — ABNORMAL HIGH (ref 70–99)

## 2022-09-03 LAB — LIPASE, BLOOD: Lipase: 40 U/L (ref 11–51)

## 2022-09-03 LAB — URINALYSIS, ROUTINE W REFLEX MICROSCOPIC
Bacteria, UA: NONE SEEN
Bilirubin Urine: NEGATIVE
Glucose, UA: >=500 mg/dL — AB
Ketones, ur: NEGATIVE mg/dL
Nitrite: NEGATIVE
Protein, ur: NEGATIVE mg/dL
Specific Gravity, Urine: 1.021 (ref 1.005–1.030)
pH: 5 (ref 5.0–8.0)

## 2022-09-03 LAB — POCT GLYCOSYLATED HEMOGLOBIN (HGB A1C): HbA1c, POC (controlled diabetic range): 11.1 % — AB (ref 0.0–7.0)

## 2022-09-03 MED ORDER — ONDANSETRON HCL 4 MG PO TABS
4.0000 mg | ORAL_TABLET | Freq: Once | ORAL | Status: AC
Start: 2022-09-03 — End: 2022-09-03
  Administered 2022-09-03: 4 mg via ORAL

## 2022-09-03 MED ORDER — IOHEXOL 350 MG/ML SOLN
75.0000 mL | Freq: Once | INTRAVENOUS | Status: AC | PRN
  Administered 2022-09-03: 75 mL via INTRAVENOUS

## 2022-09-03 MED ORDER — ONDANSETRON 4 MG PO TBDP
4.0000 mg | ORAL_TABLET | Freq: Once | ORAL | Status: AC
  Administered 2022-09-03: 4 mg via ORAL
  Filled 2022-09-03: qty 1

## 2022-09-03 MED ORDER — ONDANSETRON HCL 4 MG PO TABS: 4.0000 mg | ORAL_TABLET | Freq: Four times a day (QID) | ORAL | 0 refills | Status: DC

## 2022-09-03 MED ORDER — ONDANSETRON 4 MG PO TBDP
4.0000 mg | ORAL_TABLET | Freq: Three times a day (TID) | ORAL | 0 refills | Status: DC | PRN
  Filled 2022-09-03: qty 20, 7d supply, fill #0

## 2022-09-03 MED ORDER — ONDANSETRON 4 MG PO TBDP: 4.0000 mg | ORAL_TABLET | Freq: Three times a day (TID) | ORAL | 0 refills | Status: DC | PRN

## 2022-09-03 MED ORDER — ONDANSETRON 4 MG PO TBDP
4.0000 mg | ORAL_TABLET | Freq: Once | ORAL | Status: DC
Start: 2022-09-03 — End: 2022-09-03

## 2022-09-03 MED ORDER — DEXTROSE IN LACTATED RINGERS 5 % IV SOLN: INTRAVENOUS | Status: DC

## 2022-09-03 MED ORDER — PANTOPRAZOLE SODIUM 40 MG IV SOLR
40.0000 mg | Freq: Once | INTRAVENOUS | Status: AC
  Administered 2022-09-03: 40 mg via INTRAVENOUS
  Filled 2022-09-03: qty 10

## 2022-09-03 MED ORDER — TOUJEO SOLOSTAR 300 UNIT/ML ~~LOC~~ SOPN: 46.0000 [IU] | PEN_INJECTOR | Freq: Every day | SUBCUTANEOUS | 3 refills | Status: DC

## 2022-09-03 MED ORDER — LACTATED RINGERS IV SOLN: INTRAVENOUS | Status: DC

## 2022-09-03 MED ORDER — INSULIN ASPART 100 UNIT/ML IJ SOLN
5.0000 [IU] | Freq: Once | INTRAMUSCULAR | Status: AC
  Administered 2022-09-03: 5 [IU] via SUBCUTANEOUS

## 2022-09-03 MED ORDER — INSULIN REGULAR(HUMAN) IN NACL 100-0.9 UT/100ML-% IV SOLN: INTRAVENOUS | Status: DC

## 2022-09-03 MED ORDER — DEXTROSE 50 % IV SOLN: 0.0000 mL | INTRAVENOUS | Status: DC | PRN

## 2022-09-03 MED ORDER — OXYCODONE-ACETAMINOPHEN 5-325 MG PO TABS
2.0000 | ORAL_TABLET | Freq: Once | ORAL | Status: AC
  Administered 2022-09-03: 2 via ORAL
  Filled 2022-09-03: qty 2

## 2022-09-03 MED ORDER — SODIUM CHLORIDE 0.9 % IV BOLUS
1000.0000 mL | Freq: Once | INTRAVENOUS | Status: AC
  Administered 2022-09-03: 1000 mL via INTRAVENOUS

## 2022-09-03 NOTE — ED Triage Notes (Signed)
Pt states sent her from her PCP d/t ?pancreatitis and cbg 387. Pt c/o n/v/d since 7/10. States unable to keep water down. States was given zofran in office.

## 2022-09-03 NOTE — ED Notes (Signed)
Patient is being discharged from the Urgent Care and sent to the Emergency Department via POV . Per Erin,PA, patient is in need of higher level of care due to intractable vomiting. Patient is aware and verbalizes understanding of plan of care.  Vitals:   09/03/22 1124  BP: (!) 157/92  Pulse: 79  Resp: 20  Temp: 98.4 F (36.9 C)  SpO2: 100%

## 2022-09-03 NOTE — Addendum Note (Signed)
Addended by: Elsie Lincoln F on: 09/03/2022 12:58 PM   Modules accepted: Orders

## 2022-09-03 NOTE — Addendum Note (Signed)
Addended by: Elsie Lincoln F on: 09/03/2022 02:14 PM   Modules accepted: Orders

## 2022-09-03 NOTE — ED Provider Notes (Signed)
[place] EMERGENCY DEPARTMENT AT [facility] Provider Note   CSN: [id number] Arrival date & time: 09/03/22  1826     History  Chief Complaint  Patient presents with   Emesis    Laurie Andrews is a 52 y.o. female with medical history of hypertension, diabetes, asthma and anxiety.  The patient presents to the ED for evaluation of nausea, vomiting with diarrhea and abdominal pain, hyperglycemia.  Patient reports that ever since July 10 she has had nausea, vomiting and diarrhea that occurs every day.  She states that more recently in the last 5 days the abdominal pain, nausea, vomiting and diarrhea has acutely worsened.  She states that her abdominal pain is located in her epigastric region and it radiates into her bilateral flanks.  She is also concerned about her high blood sugar.  She states that she is not been able to keep anything down secondary to her nausea and vomiting for the last 16 days and she is also been experiencing high blood sugar.  She reports that she was advised by her PCP to stop using her Ozempic.  She reports that she has done this but her diarrhea, nausea and vomiting persist.  She reports that she has tried Imodium at home without relief. She states that she uses insulin but is unable to utilize it because she is not putting anything on her stomach.  She reports that her sugars are typically well-controlled however they are more recently in the 400s.  She is also endorsing diarrhea that began around the same time.  She denies any blood in her stool or blood in her vomit.  She denies any fevers, chest pain, shortness of breath, dysuria.  She is endorsing urinary frequency.     Emesis Associated symptoms: abdominal pain and diarrhea   Associated symptoms: no fever        Home Medications Prior to Admission medications   Medication Sig Start Date End Date Taking? Authorizing Provider  ondansetron (ZOFRAN) 4 MG tablet Take 1 tablet (4 mg total) by mouth  every 6 (six) hours. 09/03/22  Yes Al Decant, PA-C  Accu-Chek Softclix Lancets lancets Use as instructed 03/15/22   Marcine Matar, MD  albuterol (PROVENTIL) (2.5 MG/3ML) 0.083% nebulizer solution INHALE 3 ML BY NEBULIZATION EVERY 6 HOURS AS NEEDED FOR WHEEZING OR SHORTNESS OF BREATH 07/27/22   Marcine Matar, MD  albuterol (VENTOLIN HFA) 108 (90 Base) MCG/ACT inhaler INHALE 1-2 PUFFS INTO THE LUNGS DAILY AS NEEDED FOR WHEEZING OR SHORTNESS OF BREATH. 07/26/22   Marcine Matar, MD  amitriptyline (ELAVIL) 100 MG tablet Take 100 mg by mouth as needed for sleep. Patient not taking: Reported on 07/15/2022 12/29/18   [provider]  amLODipine (NORVASC) 10 MG tablet TAKE 1 TABLET BY MOUTH EVERY DAY 07/08/22   Marcine Matar, MD  atorvastatin (LIPITOR) 10 MG tablet TAKE 1 TABLET BY MOUTH EVERY DAY 08/05/22   Marcine Matar, MD  BD PEN NEEDLE NANO 2ND GEN 32G X 4 MM MISC USE A TOTAL OF 4 PEN NEEDLES DAILY FOR INSULIN INJECTION. 06/11/22   Marcine Matar, MD  benzonatate (TESSALON) 100 MG capsule Take 1 capsule (100 mg total) by mouth 3 (three) times daily as needed. Patient not taking: Reported on 07/15/2022 06/17/22   Marcine Matar, MD  cetirizine (ZYRTEC) 10 MG tablet Take 10 mg by mouth daily. 06/16/11   [provider]  Cholecalciferol (VITAMIN D3) 25 MCG (1000 UT) capsule Take  1 capsule (1,000 Units total) by mouth daily. 04/21/22   Marcine Matar, MD  Continuous Blood Gluc Receiver (FREESTYLE LIBRE 3 READER) DEVI 1 application  by Does not apply route in the morning, at noon, and at bedtime. Use to check blood sugar TID. E11.65 04/13/22   Marcine Matar, MD  Continuous Glucose Sensor (FREESTYLE LIBRE 3 SENSOR) MISC USE TO CHECK BLOOD SUGAR THREE TIMES A DAY 07/30/22   Marcine Matar, MD  diclofenac Sodium (VOLTAREN) 1 % GEL Apply 2 g topically 4 (four) times daily. 11/17/21   Jeannie Fend, PA-C  doxycycline (VIBRA-TABS) 100 MG tablet Take 1 tablet  (100 mg total) by mouth 2 (two) times daily. 05/21/22   Margaretann Loveless, PA-C  fluticasone-salmeterol (WIXELA INHUB) 100-50 MCG/ACT AEPB INHALE 1 PUFF INTO THE LUNGS TWICE A DAY 07/06/22   Marcine Matar, MD  GAVILAX 17 GM/SCOOP powder Take 17 g by mouth daily as needed for mild constipation. 09/04/19   [provider]  glucose blood (ACCU-CHEK GUIDE) test strip Use as instructed 03/15/22   Marcine Matar, MD  insulin glargine, 1 Unit Dial, (TOUJEO SOLOSTAR) 300 UNIT/ML Solostar Pen Inject 46 Units into the skin daily. 09/03/22   Marcine Matar, MD  insulin lispro (HUMALOG) 100 UNIT/ML KwikPen INJECT 10 UNITS SUBCUTANEOUSLY BEFORE BREAKFAST, 14 UNITS BEFORE LUNCH, AND 14 UNITS BEFORE DINNER. 06/07/22   Marcine Matar, MD  losartan (COZAAR) 100 MG tablet TAKE 1 TABLET BY MOUTH EVERY DAY 02/02/22   Marcine Matar, MD  montelukast (SINGULAIR) 10 MG tablet Take 1 tablet (10 mg total) by mouth daily. 04/15/22   Marcine Matar, MD  naloxone [facility]) nasal spray 4 mg/0.1 mL USE AS DIRECTED 03/15/22   Marcine Matar, MD  naratriptan (AMERGE) 2.5 MG tablet TAKE 1 TABLET BY MOUTH AS NEEDED FOR MIGRAINE. 07/08/22   Marcine Matar, MD  nystatin cream (MYCOSTATIN) Apply 1 application topically 3 (three) times daily as needed for dry skin. 01/05/20   [provider]  ondansetron (ZOFRAN-ODT) 4 MG disintegrating tablet Take 1 tablet (4 mg total) by mouth every 8 (eight) hours as needed for nausea or vomiting. 09/03/22   Marcine Matar, MD  oxyCODONE (OXY IR/ROXICODONE) 5 MG immediate release tablet Take 5 mg by mouth in the morning, at noon, and at bedtime.    [provider]  pantoprazole (PROTONIX) 40 MG tablet Take 1 tablet (40 mg total) by mouth 2 (two) times daily. Dx K21.9 04/26/22   Marcine Matar, MD  promethazine-dextromethorphan (PROMETHAZINE-DM) 6.25-15 MG/5ML syrup Take 5 mLs by mouth 4 (four) times daily as needed. 05/21/22   Margaretann Loveless,  PA-C  Semaglutide,0.25 or 0.5MG /DOS, (OZEMPIC, 0.25 OR 0.5 MG/DOSE,) 2 MG/3ML SOPN Inject 0.5 mg into the skin once a week. 06/17/22   Marcine Matar, MD  senna-docusate (SENOKOT-S) 8.6-50 MG tablet Take 1 tablet by mouth daily as needed for mild constipation. 04/15/22   Marcine Matar, MD  topiramate (TOPAMAX) 25 MG tablet Take 50 mg by mouth 2 (two) times daily. 03/20/19   [provider]  triamcinolone cream (KENALOG) 0.1 % APPLY TO AFFECTED AREA TWICE A DAY 04/12/22   Marcine Matar, MD      Allergies    Keflex [cephalexin], Metformin and related, Trulicity [dulaglutide], Aspirin, Morphine and codeine, and Penicillins    Review of Systems   Review of Systems  Constitutional:  Negative for fever.  Respiratory:  Negative for shortness  of breath.   Cardiovascular:  Negative for chest pain.  Gastrointestinal:  Positive for abdominal pain, diarrhea, nausea and vomiting.  Endocrine: Positive for polydipsia, polyphagia and polyuria.  Genitourinary:  Positive for frequency. Negative for dysuria.  All other systems reviewed and are negative.   Physical Exam Updated Vital Signs BP (!) 161/92 (BP Location: Right Arm)   Pulse 92   Temp 98.3 F (36.8 C) (Oral)   Resp 18   SpO2 100%  Physical Exam Vitals and nursing note reviewed.  Constitutional:      General: She is not in acute distress.    Appearance: Normal appearance. She is not ill-appearing, toxic-appearing or diaphoretic.  HENT:     Head: Normocephalic and atraumatic.     Nose: Nose normal.     Mouth/Throat:     Mouth: Mucous membranes are moist.     Pharynx: Oropharynx is clear.  Eyes:     Extraocular Movements: Extraocular movements intact.     Conjunctiva/sclera: Conjunctivae normal.     Pupils: Pupils are equal, round, and reactive to light.  Cardiovascular:     Rate and Rhythm: Normal rate and regular rhythm.  Pulmonary:     Effort: Pulmonary effort is normal.     Breath sounds: Normal breath sounds.  No wheezing.  Abdominal:     General: Abdomen is flat.     Tenderness: There is abdominal tenderness.  Musculoskeletal:     Cervical back: Normal range of motion and neck supple. No tenderness.  Skin:    General: Skin is warm and dry.     Capillary Refill: Capillary refill takes less than 2 seconds.  Neurological:     Mental Status: She is alert and oriented to person, place, and time.     ED Results / Procedures / Treatments   Labs (all labs ordered are listed, but only abnormal results are displayed) Labs Reviewed  CBC WITH DIFFERENTIAL/PLATELET - Abnormal; Notable for the following components:      Result Value   MCH 25.7 (*)    All other components within normal limits  URINALYSIS, ROUTINE W REFLEX MICROSCOPIC - Abnormal; Notable for the following components:   Color, Urine STRAW (*)    Glucose, UA >=500 (*)    Hgb urine dipstick SMALL (*)    Leukocytes,Ua SMALL (*)    All other components within normal limits  COMPREHENSIVE METABOLIC PANEL - Abnormal; Notable for the following components:   Sodium 132 (*)    Glucose, Bld 382 (*)    Alkaline Phosphatase 132 (*)    All other components within normal limits  CBG MONITORING, ED - Abnormal; Notable for the following components:   Glucose-Capillary 449 (*)    All other components within normal limits  CBG MONITORING, ED - Abnormal; Notable for the following components:   Glucose-Capillary 233 (*)    All other components within normal limits  LIPASE, BLOOD    EKG None  Radiology CT ABDOMEN PELVIS W CONTRAST  Result Date: 09/03/2022 CLINICAL DATA:  Nausea and vomiting, abdominal pain EXAM: CT ABDOMEN AND PELVIS WITH CONTRAST TECHNIQUE: Multidetector CT imaging of the abdomen and pelvis was performed using the standard protocol following bolus administration of intravenous contrast. RADIATION DOSE REDUCTION: This exam was performed according to the departmental dose-optimization program which includes automated exposure  control, adjustment of the mA and/or kV according to patient size and/or use of iterative reconstruction technique. CONTRAST:  75mL OMNIPAQUE IOHEXOL 350 MG/ML SOLN COMPARISON:  01/17/2022, 04/05/2020 FINDINGS: Lower  chest: No acute pleural or parenchymal lung disease. Hepatobiliary: Prior cholecystectomy. Mild diffuse hepatic steatosis. No focal parenchymal liver abnormality. Pancreas: Unremarkable. No pancreatic ductal dilatation or surrounding inflammatory changes. Spleen: Normal in size without focal abnormality. Adrenals/Urinary Tract: The adrenals and kidneys are stable in appearance. No urinary tract calculi or obstructive uropathy. The bladder is unremarkable. Stomach/Bowel: No bowel obstruction or ileus. No bowel wall thickening or inflammatory change. Small hiatal hernia. Vascular/Lymphatic: Aortic atherosclerosis. No enlarged abdominal or pelvic lymph nodes. Reproductive: Uterus and bilateral adnexa are unremarkable. Other: No free fluid or free intraperitoneal gas. No abdominal wall hernia. Musculoskeletal: No acute or destructive bony abnormalities. Reconstructed images demonstrate no additional findings. IMPRESSION: 1. No acute intra-abdominal or intrapelvic process. 2. Hepatic steatosis. 3.  Aortic Atherosclerosis (ICD10-I70.0). Electronically Signed   By: Sharlet Salina M.D.   On: 09/03/2022 22:43    Procedures Procedures   Medications Ordered in ED Medications  oxyCODONE-acetaminophen (PERCOCET/ROXICET) 5-325 MG per tablet 2 tablet (2 tablets Oral Given 09/03/22 2109)  ondansetron (ZOFRAN-ODT) disintegrating tablet 4 mg (4 mg Oral Given 09/03/22 2111)  insulin aspart (novoLOG) injection 5 Units (5 Units Subcutaneous Given 09/03/22 2130)  sodium chloride 0.9 % bolus 1,000 mL (1,000 mLs Intravenous New Bag/Given 09/03/22 2209)  pantoprazole (PROTONIX) injection 40 mg (40 mg Intravenous Given 09/03/22 2213)  iohexol (OMNIPAQUE) 350 MG/ML injection 75 mL (75 mLs Intravenous Contrast Given 09/03/22  2235)    ED Course/ Medical Decision Making/ A&P  Medical Decision Making Risk Prescription drug management.   52 year old female presents presents to ED for evaluation.  Please see HPI for further details.  On examination the patient is afebrile and nontachycardic.  Her lung sounds are clear bilaterally and she is not hypoxic.  Her abdomen is soft and compressible throughout but there is generalized abdominal tenderness that is nonfocal in nature.  No CVA tenderness bilaterally.  Neurological examination is at baseline.  Patient CBC shows no leukocytosis, no anemia.  Patient metabolic panel shows sodium 132, glucose 3 2, alk phos 132.  Patient anion gap is 10, doubt DKA.  Patient potassium 4.3.  Patient urinalysis shows small hemoglobin, small leukocytes, glucose.  Patient denies dysuria.  Patient lipase WNL.  Patient initial CBG shows 449.  Patient was given 5 units of subcutaneous insulin, 1 L fluid, 4 mg Zofran.  On reassessment, the patient CBG has decreased to 233.  Patient CT scan of her abdomen or pelvis does not show any acute intra-abdominal pathology.  Patient nausea, vomiting and diarrhea to be secondary to her Ozempic use.  She was advised to follow-up with her PCP which she states that she will be doing this week.  The patient will be sent home with Zofran for nausea and vomiting.  At this time, patient blood sugar is downtrending.  Patient last CBG is 233.  The patient will be discharged home at this time.  She will follow-up with her PCP for further management and evaluation.  She will be sent home with Zofran for nausea and vomiting.  She was advised to return to the ED with any new or worsening signs or symptoms and she voiced understanding.  She had all her questions answered to her satisfaction.  She is stable to discharge home at this time.   Final Clinical Impression(s) / ED Diagnoses Final diagnoses:  Nausea vomiting and diarrhea  Hyperglycemia    Rx / DC Orders ED  Discharge Orders          Ordered  ondansetron (ZOFRAN) 4 MG tablet  Every 6 hours        09/03/22 2334              Clent Ridges 09/03/22 3474    Glyn Ade, MD 09/03/22 2352

## 2022-09-03 NOTE — ED Triage Notes (Signed)
Pt is coming in for nausea and vomiting (started July 10th), also with this is reporting her sugars have not been well controlled. Has not been taking steriods and has been unable tot take her insulin due to her vomiting her food or unable to keep it down. Pt endorses abd pain with some lower back pain, generalized weakness, blurry visio, frequent urination.

## 2022-09-03 NOTE — ED Notes (Signed)
The pt  returned from c-t  had a little nausea whenever they injected the dye in c-t none now

## 2022-09-03 NOTE — ED Provider Notes (Signed)
[facility]    CSN: [id number] Arrival date & time: 09/03/22  1021      History   Chief Complaint Chief Complaint  Patient presents with   Emesis   Diarrhea    HPI Laurie Andrews is a 52 y.o. female.   Patient presents today with a several week history of upper abdominal pain, nausea, vomiting, diarrhea.  She reports that pain is rated 8 on a 0-10 pain scale, described as aching/shooting, localized to upper abdomen without radiation, no alleviating factors identified.  She has not tried any over-the-counter medication for symptom management.  She was seen by clinical pharmacist at her PCP for diabetes management earlier today and they sent her here for further evaluation and management.  They did give her Zofran in clinic but she has had recurrent nausea/vomiting despite this medication.  She is unsure the dose that she was given.  She does take Ozempic and has been on this medication for several months.  Denies any recent dose adjustment.  She does not drink alcohol regularly.  Denies history of gastrointestinal disorder outside of GERD.  She reports very minimal oral intake as even drinking water and eating crackers will sometimes trigger emesis.  She denies history of pancreatitis.    Past Medical History:  Diagnosis Date   Anxiety    Asthma    Diabetes mellitus without complication (HCC)    Hypertension     Patient Active Problem List   Diagnosis Date Noted   Hyperlipidemia 07/25/2021   Influenza vaccine needed 05/08/2020   Obesity (BMI 30.0-34.9) 05/08/2020   Moderate persistent asthma without complication 05/08/2020   Post-traumatic osteoarthritis of both knees 05/08/2020   Acute pyelonephritis 04/09/2020   Constipation 04/09/2020   Type 2 diabetes mellitus with hyperglycemia (HCC) 04/08/2020   Essential hypertension 04/08/2020    Past Surgical History:  Procedure Laterality Date   BREAST SURGERY     CHOLECYSTECTOMY     HIGH RISK BREAST EXCISION      KNEE SURGERY      OB History   No obstetric history on file.      Home Medications    Prior to Admission medications   Medication Sig Start Date End Date Taking? Authorizing Provider  Accu-Chek Softclix Lancets lancets Use as instructed 03/15/22   Marcine Matar, MD  albuterol (PROVENTIL) (2.5 MG/3ML) 0.083% nebulizer solution INHALE 3 ML BY NEBULIZATION EVERY 6 HOURS AS NEEDED FOR WHEEZING OR SHORTNESS OF BREATH 07/27/22   Marcine Matar, MD  albuterol (VENTOLIN HFA) 108 (90 Base) MCG/ACT inhaler INHALE 1-2 PUFFS INTO THE LUNGS DAILY AS NEEDED FOR WHEEZING OR SHORTNESS OF BREATH. 07/26/22   Marcine Matar, MD  amitriptyline (ELAVIL) 100 MG tablet Take 100 mg by mouth as needed for sleep. Patient not taking: Reported on 07/15/2022 12/29/18   [provider]  amLODipine (NORVASC) 10 MG tablet TAKE 1 TABLET BY MOUTH EVERY DAY 07/08/22   Marcine Matar, MD  atorvastatin (LIPITOR) 10 MG tablet TAKE 1 TABLET BY MOUTH EVERY DAY 08/05/22   Marcine Matar, MD  BD PEN NEEDLE NANO 2ND GEN 32G X 4 MM MISC USE A TOTAL OF 4 PEN NEEDLES DAILY FOR INSULIN INJECTION. 06/11/22   Marcine Matar, MD  benzonatate (TESSALON) 100 MG capsule Take 1 capsule (100 mg total) by mouth 3 (three) times daily as needed. Patient not taking: Reported on 07/15/2022 06/17/22   Marcine Matar, MD  cetirizine (ZYRTEC) 10 MG tablet Take 10 mg  by mouth daily. 06/16/11   [provider]  Cholecalciferol (VITAMIN D3) 25 MCG (1000 UT) capsule Take 1 capsule (1,000 Units total) by mouth daily. 04/21/22   Marcine Matar, MD  Continuous Blood Gluc Receiver (FREESTYLE LIBRE 3 READER) DEVI 1 application  by Does not apply route in the morning, at noon, and at bedtime. Use to check blood sugar TID. E11.65 04/13/22   Marcine Matar, MD  Continuous Glucose Sensor (FREESTYLE LIBRE 3 SENSOR) MISC USE TO CHECK BLOOD SUGAR THREE TIMES A DAY 07/30/22   Marcine Matar, MD  diclofenac Sodium (VOLTAREN) 1  % GEL Apply 2 g topically 4 (four) times daily. 11/17/21   Jeannie Fend, PA-C  doxycycline (VIBRA-TABS) 100 MG tablet Take 1 tablet (100 mg total) by mouth 2 (two) times daily. 05/21/22   Margaretann Loveless, PA-C  fluticasone-salmeterol (WIXELA INHUB) 100-50 MCG/ACT AEPB INHALE 1 PUFF INTO THE LUNGS TWICE A DAY 07/06/22   Marcine Matar, MD  GAVILAX 17 GM/SCOOP powder Take 17 g by mouth daily as needed for mild constipation. 09/04/19   [provider]  glucose blood (ACCU-CHEK GUIDE) test strip Use as instructed 03/15/22   Marcine Matar, MD  insulin glargine, 1 Unit Dial, (TOUJEO SOLOSTAR) 300 UNIT/ML Solostar Pen Inject 46 Units into the skin daily. 09/03/22   Marcine Matar, MD  insulin lispro (HUMALOG) 100 UNIT/ML KwikPen INJECT 10 UNITS SUBCUTANEOUSLY BEFORE BREAKFAST, 14 UNITS BEFORE LUNCH, AND 14 UNITS BEFORE DINNER. 06/07/22   Marcine Matar, MD  losartan (COZAAR) 100 MG tablet TAKE 1 TABLET BY MOUTH EVERY DAY 02/02/22   Marcine Matar, MD  montelukast (SINGULAIR) 10 MG tablet Take 1 tablet (10 mg total) by mouth daily. 04/15/22   Marcine Matar, MD  naloxone [facility]) nasal spray 4 mg/0.1 mL USE AS DIRECTED 03/15/22   Marcine Matar, MD  naratriptan (AMERGE) 2.5 MG tablet TAKE 1 TABLET BY MOUTH AS NEEDED FOR MIGRAINE. 07/08/22   Marcine Matar, MD  nystatin cream (MYCOSTATIN) Apply 1 application topically 3 (three) times daily as needed for dry skin. 01/05/20   [provider]  ondansetron (ZOFRAN-ODT) 4 MG disintegrating tablet Take 1 tablet (4 mg total) by mouth every 8 (eight) hours as needed for nausea or vomiting. 09/03/22   Marcine Matar, MD  oxyCODONE (OXY IR/ROXICODONE) 5 MG immediate release tablet Take 5 mg by mouth in the morning, at noon, and at bedtime.    [provider]  pantoprazole (PROTONIX) 40 MG tablet Take 1 tablet (40 mg total) by mouth 2 (two) times daily. Dx K21.9 04/26/22   Marcine Matar, MD   promethazine-dextromethorphan (PROMETHAZINE-DM) 6.25-15 MG/5ML syrup Take 5 mLs by mouth 4 (four) times daily as needed. 05/21/22   Margaretann Loveless, PA-C  Semaglutide,0.25 or 0.5MG /DOS, (OZEMPIC, 0.25 OR 0.5 MG/DOSE,) 2 MG/3ML SOPN Inject 0.5 mg into the skin once a week. 06/17/22   Marcine Matar, MD  senna-docusate (SENOKOT-S) 8.6-50 MG tablet Take 1 tablet by mouth daily as needed for mild constipation. 04/15/22   Marcine Matar, MD  topiramate (TOPAMAX) 25 MG tablet Take 50 mg by mouth 2 (two) times daily. 03/20/19   [provider]  triamcinolone cream (KENALOG) 0.1 % APPLY TO AFFECTED AREA TWICE A DAY 04/12/22   Marcine Matar, MD    Family History Family History  Problem Relation Age of Onset   Breast cancer Maternal Aunt     Social History Social  History   Tobacco Use   Smoking status: Never   Smokeless tobacco: Never  Vaping Use   Vaping status: Never Used  Substance Use Topics   Alcohol use: Not Currently   Drug use: Never     Allergies   Keflex [cephalexin], Metformin and related, Trulicity [dulaglutide], Aspirin, Morphine and codeine, and Penicillins   Review of Systems Review of Systems  Constitutional:  Positive for activity change. Negative for appetite change, fatigue and fever.  Respiratory:  Negative for shortness of breath.   Cardiovascular:  Negative for chest pain.  Gastrointestinal:  Positive for abdominal pain, nausea and vomiting. Negative for diarrhea.  Neurological:  Negative for dizziness, light-headedness and headaches.     Physical Exam Triage Vital Signs ED Triage Vitals  Encounter Vitals Group     BP 09/03/22 1124 (!) 157/92     Systolic BP Percentile --      Diastolic BP Percentile --      Pulse Rate 09/03/22 1124 79     Resp 09/03/22 1124 20     Temp 09/03/22 1124 98.4 F (36.9 C)     Temp Source 09/03/22 1124 Oral     SpO2 09/03/22 1124 100 %     Weight --      Height --      Head Circumference --      Peak  Flow --      Pain Score 09/03/22 1125 0     Pain Loc --      Pain Education --      Exclude from Growth Chart --    No data found.  Updated Vital Signs BP (!) 157/92 (BP Location: Right Arm)   Pulse 79   Temp 98.4 F (36.9 C) (Oral)   Resp 20   SpO2 100%   Visual Acuity Right Eye Distance:   Left Eye Distance:   Bilateral Distance:    Right Eye Near:   Left Eye Near:    Bilateral Near:     Physical Exam Vitals reviewed.  Constitutional:      General: She is awake. She is not in acute distress.    Appearance: Normal appearance. She is well-developed. She is ill-appearing.  HENT:     Head: Normocephalic and atraumatic.     Mouth/Throat:     Mouth: Mucous membranes are moist.     Pharynx: Uvula midline. No oropharyngeal exudate or posterior oropharyngeal erythema.  Cardiovascular:     Rate and Rhythm: Normal rate and regular rhythm.     Heart sounds: Normal heart sounds, S1 normal and S2 normal. No murmur heard. Pulmonary:     Effort: Pulmonary effort is normal.     Breath sounds: Normal breath sounds. No wheezing, rhonchi or rales.  Abdominal:     General: Bowel sounds are normal.     Palpations: Abdomen is soft.     Tenderness: There is abdominal tenderness in the epigastric area. There is guarding. There is no right CVA tenderness, left CVA tenderness or rebound.     Comments: Voluntary guarding in upper abdomen.  Psychiatric:        Behavior: Behavior is cooperative.      UC Treatments / Results  Labs (all labs ordered are listed, but only abnormal results are displayed) Labs Reviewed - No data to display  EKG   Radiology No results found.  Procedures Procedures (including critical care time)  Medications Ordered in UC Medications - No data to display  Initial Impression /  Assessment and Plan / UC Course  I have reviewed the triage vital signs and the nursing notes.  Pertinent labs & imaging results that were available during my care of the  patient were reviewed by me and considered in my medical decision making (see chart for details).     Patient is ill-appearing and holding emesis basin but in no acute distress.  Vital signs are reassuring.  She had significant tenderness over her epigastrium with recurrent nausea vomiting despite recent dose of antiemetics at PCP.  Discussed that the safest thing to do would be go to the emergency room for further evaluation and management since we do not have imaging capabilities in urgent care or stat labs.  Patient is agreeable to this and will go directly to [facility], ER for further evaluation and management.  She is stable at the time of discharge and safe for private transport.  Final Clinical Impressions(s) / UC Diagnoses   Final diagnoses:  Intractable nausea and vomiting  Upper abdominal pain     Discharge Instructions      Please go to the emergency room as we discussed for further evaluation and management.     ED Prescriptions   None    PDMP not reviewed this encounter.   Jeani Hawking, PA-C 09/03/22 1203

## 2022-09-03 NOTE — ED Provider Triage Note (Signed)
Emergency Medicine Provider Triage Evaluation Note  Laurie Andrews , a 52 y.o. female  was evaluated in triage.  Pt complains of abdominal pain nausea vomiting.  This been going on for a month.  PCP told her to stop her Ozempic.  She went into an urgent care and then was sent here.  She complains of severe abdominal pain nausea and vomiting.  She has been unable to hold anything down.  Notably hyperglycemic with blurry vision, thirst and frequent urination..  Review of Systems  Positive: Abdominal pain nausea vomiting Negative:   Physical Exam  BP (!) 161/92 (BP Location: Right Arm)   Pulse 92   Temp 98.3 F (36.8 C) (Oral)   Resp 18   SpO2 100%  Gen:   Awake, no distress   Resp:  Normal effort  MSK:   Moves extremities without difficulty  Other:    Medical Decision Making  Medically screening exam initiated at 7:43 PM.  Appropriate orders placed.  Sionna Muchnick was informed that the remainder of the evaluation will be completed by another provider, this initial triage assessment does not replace that evaluation, and the importance of remaining in the ED until their evaluation is complete.     Arthor Captain, PA-C 09/03/22 1946

## 2022-09-03 NOTE — Progress Notes (Signed)
S:    PCP: Dr. Laural Benes  52 y.o. female who presents for diabetes evaluation, education, and management.  PMH is significant for 2DM w/ peripheral neuropathy, HTN, opiate dependence, anxiety disorder, asthma, migraines, OA knees, alopecia. Patient was referred by Primary Care Provider, Dr. Laural Benes, on 04/15/2022. Last seen by pharmacy clinic on 06/17/2022. We adjusted her Ozempic and Lantus doses. I also got PA approval to change from Lantus to Toujeo.  Today, patient arrives in poor spirits and presents without any assistance. Endorses 2 week hx of nausea vomiting. Drinking gingerale, water, and Gatorade Zero. Patient has FreeStyle Libre 3 CGM in place. Her CGM data is summarized below. 8/10 epigastric abdominal pain. Started 08/19/2022. Has been coming and going every day since. Denies any fever but endorses chills. During my encounter she had emesis that appeared bilious. We were able to administer PO ondansetron that she kept down.   Current diabetes medications include: Toujeo 38 units daily, Humalog 11/21/12 units TID, Ozempic 0.5 mg weekly (was dispensed the 2mg  pen but maintains that she has been taking 0.5 mg dose)  Patient reports adherence to taking all medications as prescribed.   Insurance coverage: UHC Medicare + Medicaid   Patient denies hypoglycemic events since last visit.   Reported home fasting blood sugars: see CGM data below   Patient reports nocturia (nighttime urination).  Patient reports neuropathy (nerve pain). Patient denies visual changes. Patient reports self foot exams.  Patient reported  Breakfast: glucose shake Lunch: tuna fish sandwich, 2 piece of bread Dinner: baked chicken, rice (1 cup), veggies   Snacks: reports not snacking Beverages: water, coffee (sugar creamer)  Exercise: walks around complex (15-20 mins) 3 times a week; starting physical therapy   O:  Date of Download: 7/12-7/25/24 Current: reading high Average Glucose: >300 Glucose  Management Indicator: 10.7% T - Time above range: 82%    Lab Results  Component Value Date   HGBA1C 11.1 (A) 09/03/2022   There were no vitals filed for this visit.  Lipid Panel     Component Value Date/Time   CHOL 177 07/24/2021 1501   TRIG 259 (H) 07/24/2021 1501   HDL 46 07/24/2021 1501   CHOLHDL 3.8 07/24/2021 1501   LDLCALC 88 07/24/2021 1501    Clinical Atherosclerotic Cardiovascular Disease (ASCVD): No  The 10-year ASCVD risk score (Arnett DK, et al., 2019) is: 17%   Values used to calculate the score:     Age: 89 years     Sex: Female     Is Non-Hispanic African American: Yes     Diabetic: Yes     Tobacco smoker: No     Systolic Blood Pressure: 144 mmHg     Is BP treated: Yes     HDL Cholesterol: 46 mg/dL     Total Cholesterol: 177 mg/dL    A/P: Diabetes longstanding, currently above goal based on today's A1c. Given her presentation, we are sending her to Urgent Care. Suspect pancreatitis or possible DKA in the setting of dehydration. Will stop Ozempic given the possibility of GLP-induced pancreatitis.  -Increase Lantus to 46 units every morning.  -Continue Humalog 10 units with breakfast and 14 units with lunch and supper. -Stop Ozempic. -Patient sent to [facility] for further eval. -Defer initiation of SGLT2-I  until A1c closer to goal.  -Intolerant to metformin. -Patient educated on purpose, proper use, and potential adverse effects of insulin.  -Extensively discussed pathophysiology of diabetes, recommended lifestyle interventions, dietary effects on blood sugar control.  -  Counseled on s/sx of and management of hypoglycemia.  -Next A1c anticipated October 2024.   Written patient instructions provided. Patient verbalized understanding of treatment plan.  Total time in face to face counseling 30 minutes.    Follow-up:  Pharmacist: 2-3 weeks.  Butch Penny, PharmD, Patsy [place], CPP Clinical Pharmacist [facility] [phone]

## 2022-09-03 NOTE — Progress Notes (Signed)
Patient seen in room 1 with c/o Nausea and vomiting. Laurie Andrews Emesis noted I exam room trash can. Pharmacist in the room. Patient given Zofran 4 mg po as instructed.

## 2022-09-03 NOTE — Discharge Instructions (Signed)
Please go to the emergency room as we discussed for further evaluation and management.

## 2022-09-03 NOTE — Addendum Note (Signed)
Addended by: Elsie Lincoln F on: 09/03/2022 12:30 PM   Modules accepted: Orders

## 2022-09-03 NOTE — Discharge Instructions (Addendum)
It was a pleasure taking part in your care today.  As discussed, you do not have any signs of diabetic ketoacidosis.  We gave you 5 units of insulin and fluid and your blood sugar decreased.  I would like for you to follow-up with your PCP on Monday for further management and reevaluation.  If you begin to have any new or worsening signs or symptoms over the weekend, please return to the ED for further management.  I am sending you home with Zofran which I want you to take every 6 hours for nausea and vomiting.  Please continue taking your insulin as prescribed.

## 2022-09-16 ENCOUNTER — Ambulatory Visit: Payer: 59 | Admitting: Pharmacist

## 2022-09-17 DIAGNOSIS — Z79891 Long term (current) use of opiate analgesic: Secondary | ICD-10-CM | POA: Diagnosis not present

## 2022-09-17 DIAGNOSIS — M5431 Sciatica, right side: Secondary | ICD-10-CM | POA: Diagnosis not present

## 2022-09-17 DIAGNOSIS — G894 Chronic pain syndrome: Secondary | ICD-10-CM | POA: Diagnosis not present

## 2022-09-17 DIAGNOSIS — M1710 Unilateral primary osteoarthritis, unspecified knee: Secondary | ICD-10-CM | POA: Diagnosis not present

## 2022-09-17 DIAGNOSIS — M25562 Pain in left knee: Secondary | ICD-10-CM | POA: Diagnosis not present

## 2022-09-17 DIAGNOSIS — M25561 Pain in right knee: Secondary | ICD-10-CM | POA: Diagnosis not present

## 2022-09-17 DIAGNOSIS — G8929 Other chronic pain: Secondary | ICD-10-CM | POA: Diagnosis not present

## 2022-09-17 DIAGNOSIS — M545 Low back pain, unspecified: Secondary | ICD-10-CM | POA: Diagnosis not present

## 2022-09-29 ENCOUNTER — Other Ambulatory Visit: Payer: Self-pay | Admitting: Internal Medicine

## 2022-10-01 DIAGNOSIS — M79669 Pain in unspecified lower leg: Secondary | ICD-10-CM | POA: Diagnosis not present

## 2022-10-01 DIAGNOSIS — R262 Difficulty in walking, not elsewhere classified: Secondary | ICD-10-CM | POA: Diagnosis not present

## 2022-10-02 ENCOUNTER — Other Ambulatory Visit: Payer: Self-pay | Admitting: Internal Medicine

## 2022-10-02 DIAGNOSIS — I152 Hypertension secondary to endocrine disorders: Secondary | ICD-10-CM

## 2022-10-06 ENCOUNTER — Other Ambulatory Visit: Payer: Self-pay | Admitting: Internal Medicine

## 2022-10-06 DIAGNOSIS — K219 Gastro-esophageal reflux disease without esophagitis: Secondary | ICD-10-CM

## 2022-10-09 ENCOUNTER — Other Ambulatory Visit: Payer: Self-pay | Admitting: Internal Medicine

## 2022-10-09 DIAGNOSIS — Z8669 Personal history of other diseases of the nervous system and sense organs: Secondary | ICD-10-CM

## 2022-10-12 ENCOUNTER — Ambulatory Visit: Payer: 59 | Admitting: Internal Medicine

## 2022-10-12 NOTE — Telephone Encounter (Signed)
Requested by interface surescripts. Future visit in 2 months.  Requested Prescriptions  Pending Prescriptions Disp Refills   naratriptan (AMERGE) 2.5 MG tablet [Pharmacy Med Name: NARATRIPTAN HCL 2.5 MG TABLET] 10 tablet 1    Sig: TAKE 1 TABLET BY MOUTH AS NEEDED FOR MIGRAINE.     Neurology:  Migraine Therapy - Triptan - naratriptan Failed - 10/09/2022 12:10 AM      Failed - Last BP in normal range    BP Readings from Last 1 Encounters:  09/04/22 (!) 144/78         Passed - Cr in normal range and within 360 days    Creatinine, Ser  Date Value Ref Range Status  09/03/2022 0.87 0.44 - 1.00 mg/dL Final         Passed - AST in normal range and within 360 days    AST  Date Value Ref Range Status  09/03/2022 16 15 - 41 U/L Final         Passed - ALT in normal range and within 360 days    ALT  Date Value Ref Range Status  09/03/2022 20 0 - 44 U/L Final         Passed - eGFR is 15 or above and within 360 days    GFR calc Af Amer  Date Value Ref Range Status  09/07/2019 >60 >60 mL/min Final   GFR, Estimated  Date Value Ref Range Status  09/03/2022 >60 >60 mL/min Final    Comment:    (NOTE) Calculated using the CKD-EPI Creatinine Equation (2021)    eGFR  Date Value Ref Range Status  07/24/2021 91 >59 mL/min/1.73 Final         Passed - Valid encounter within last 12 months    Recent Outpatient Visits           1 month ago Moderate nausea and vomiting   [place] [facility] [place], [place] L, RPH-CPP   3 months ago Type 2 diabetes mellitus with obesity [facility])   [place] [facility] [place], [place] L, RPH-CPP   4 months ago Type 2 diabetes mellitus with obesity [facility])   [place] [facility] [place], [place] L, RPH-CPP   6 months ago Type 2 diabetes mellitus with hyperglycemia, with long-term current use of insulin [facility])   [place] [facility] Jonah Blue B, MD   6 months ago Type 2 diabetes mellitus with obesity [facility])   [place] [facility] Drucilla Chalet, RPH-CPP       Future Appointments             In 2 months Laural Benes, Binnie Rail, MD [facility]

## 2022-10-13 ENCOUNTER — Other Ambulatory Visit: Payer: Self-pay | Admitting: Internal Medicine

## 2022-10-13 DIAGNOSIS — J4540 Moderate persistent asthma, uncomplicated: Secondary | ICD-10-CM

## 2022-11-05 DIAGNOSIS — M1710 Unilateral primary osteoarthritis, unspecified knee: Secondary | ICD-10-CM | POA: Diagnosis not present

## 2022-11-05 DIAGNOSIS — M5431 Sciatica, right side: Secondary | ICD-10-CM | POA: Diagnosis not present

## 2022-11-05 DIAGNOSIS — G894 Chronic pain syndrome: Secondary | ICD-10-CM | POA: Diagnosis not present

## 2022-11-05 DIAGNOSIS — Z79891 Long term (current) use of opiate analgesic: Secondary | ICD-10-CM | POA: Diagnosis not present

## 2022-11-05 DIAGNOSIS — G8929 Other chronic pain: Secondary | ICD-10-CM | POA: Diagnosis not present

## 2022-11-05 DIAGNOSIS — M545 Low back pain, unspecified: Secondary | ICD-10-CM | POA: Diagnosis not present

## 2022-11-05 DIAGNOSIS — M25562 Pain in left knee: Secondary | ICD-10-CM | POA: Diagnosis not present

## 2022-11-05 DIAGNOSIS — M25561 Pain in right knee: Secondary | ICD-10-CM | POA: Diagnosis not present

## 2022-11-07 ENCOUNTER — Other Ambulatory Visit: Payer: Self-pay | Admitting: Internal Medicine

## 2022-11-08 ENCOUNTER — Telehealth: Payer: Self-pay | Admitting: Internal Medicine

## 2022-11-08 NOTE — Telephone Encounter (Signed)
Requested Prescriptions  Pending Prescriptions Disp Refills   BD PEN NEEDLE NANO 2ND GEN 32G X 4 MM MISC [Pharmacy Med Name: BD NANO 2 GEN PEN NDL 32G 4MM] 100 each 3    Sig: USE A TOTAL OF 4 PEN NEEDLES DAILY FOR INSULIN INJECTION.     Endocrinology: Diabetes - Testing Supplies Passed - 11/07/2022  1:49 AM      Passed - Valid encounter within last 12 months    Recent Outpatient Visits           2 months ago Moderate nausea and vomiting   [place] [facility] [place], Laurie L, RPH-CPP   4 months ago Type 2 diabetes mellitus with obesity [facility])   [place] [facility] [place], [place] L, RPH-CPP   5 months ago Type 2 diabetes mellitus with obesity [facility])   [place] [facility] [place], Jeannett Senior L, RPH-CPP   6 months ago Type 2 diabetes mellitus with hyperglycemia, with long-term current use of insulin [facility])   [place] [facility] Jonah Blue B, MD   6 months ago Type 2 diabetes mellitus with obesity [facility])    [place] [facility] Drucilla Chalet, RPH-CPP       Future Appointments             In 1 month Laural Benes, Binnie Rail, MD [facility]

## 2022-11-08 NOTE — Telephone Encounter (Signed)
Pt is calling to schedule appt with [facility]. Please advise [phone]- [phone]

## 2022-11-09 NOTE — Telephone Encounter (Signed)
Call placed to patient unable to reach message left on VM.   

## 2022-11-09 NOTE — Telephone Encounter (Signed)
Returned call. Pt scheduled for 10/15 @ 2pm.

## 2022-11-10 ENCOUNTER — Other Ambulatory Visit: Payer: Self-pay | Admitting: Internal Medicine

## 2022-11-10 NOTE — Telephone Encounter (Signed)
Requested Prescriptions  Pending Prescriptions Disp Refills   losartan (COZAAR) 100 MG tablet [Pharmacy Med Name: LOSARTAN POTASSIUM 100 MG TAB] 90 tablet 0    Sig: TAKE 1 TABLET BY MOUTH EVERY DAY     Cardiovascular:  Angiotensin Receptor Blockers Failed - 11/10/2022 12:15 AM      Failed - Last BP in normal range    BP Readings from Last 1 Encounters:  09/04/22 (!) 144/78         Passed - Cr in normal range and within 180 days    Creatinine, Ser  Date Value Ref Range Status  09/03/2022 0.87 0.44 - 1.00 mg/dL Final         Passed - K in normal range and within 180 days    Potassium  Date Value Ref Range Status  09/03/2022 4.3 3.5 - 5.1 mmol/L Final         Passed - Patient is not pregnant      Passed - Valid encounter within last 6 months    Recent Outpatient Visits           2 months ago Moderate nausea and vomiting   [place] [facility] [place], [place] L, RPH-CPP   4 months ago Type 2 diabetes mellitus with obesity [facility]   [place] [facility] [place], Pringle L, RPH-CPP   5 months ago Type 2 diabetes mellitus with obesity [facility])   [place] [facility] [place], [place] L, RPH-CPP   6 months ago Type 2 diabetes mellitus with hyperglycemia, with long-term current use of insulin [facility])   [place] [facility] Jonah Blue B, MD   7 months ago Type 2 diabetes mellitus with obesity [facility])   [place] [facility] Drucilla Chalet, RPH-CPP       Future Appointments             In 1 week Lois Huxley, Cornelius Moras, RPH-CPP [facility]   In 1 month Marcine Matar, MD [facility]             atorvastatin (LIPITOR) 10 MG tablet [Pharmacy Med Name: ATORVASTATIN 10 MG TABLET] 90 tablet 0    Sig: TAKE 1 TABLET BY MOUTH EVERY DAY     Cardiovascular:   Antilipid - Statins Failed - 11/10/2022 12:15 AM      Failed - Lipid Panel in normal range within the last 12 months    Cholesterol, Total  Date Value Ref Range Status  07/24/2021 177 100 - 199 mg/dL Final   LDL Chol Calc (NIH)  Date Value Ref Range Status  07/24/2021 88 0 - 99 mg/dL Final   HDL  Date Value Ref Range Status  07/24/2021 46 >39 mg/dL Final   Triglycerides  Date Value Ref Range Status  07/24/2021 259 (H) 0 - 149 mg/dL Final         Passed - Patient is not pregnant      Passed - Valid encounter within last 12 months    Recent Outpatient Visits           2 months ago Moderate nausea and vomiting   [facility] [place], [place] L, RPH-CPP   4 months ago Type 2 diabetes mellitus with [facility])   [place] [place]  Allyson Sabal, RPH-CPP   5 months ago Type 2 diabetes mellitus with obesity [facility])   [place] [facility] [place], Reynolds L, RPH-CPP   6 months ago Type 2 diabetes mellitus with hyperglycemia, with long-term current use of insulin [facility])   [place] [place] Jonah Blue B, MD   7 months ago Type 2 diabetes mellitus with obesity [facility])   [place] [facility] Drucilla Chalet, RPH-CPP       Future Appointments             In 1 week Lois Huxley, Cornelius Moras, RPH-CPP [facility]   In 1 month Laural Benes, Binnie Rail, MD [facility]

## 2022-11-16 ENCOUNTER — Ambulatory Visit: Payer: 59 | Attending: Internal Medicine

## 2022-11-16 VITALS — Ht 61.0 in | Wt 184.0 lb

## 2022-11-16 DIAGNOSIS — Z1231 Encounter for screening mammogram for malignant neoplasm of breast: Secondary | ICD-10-CM

## 2022-11-16 DIAGNOSIS — Z0000 Encounter for general adult medical examination without abnormal findings: Secondary | ICD-10-CM | POA: Diagnosis not present

## 2022-11-16 NOTE — Patient Instructions (Signed)
Ms. Racine , Thank you for taking time to come for your Medicare Wellness Visit. I appreciate your ongoing commitment to your health goals. Please review the following plan we discussed and let me know if I can assist you in the future.   Referrals/Orders/Follow-Ups/Clinician Recommendations: Aim for 30 minutes of exercise or brisk walking, 6-8 glasses of water, and 5 servings of fruits and vegetables each day.  You have an order for:  []   2D Mammogram  [x]   3D Mammogram  []   Bone Density     Please call for appointment:   [facility] [address] [place], [place] [address] [phone]    Make sure to wear two-piece clothing.  No lotions, powders, or deodorants the day of the appointment. Make sure to bring picture ID and insurance card.  Bring list of medications you are currently taking including any supplements.   Schedule your [place] screening mammogram through MyChart!   Log into your MyChart account.  Go to 'Visit' (or 'Appointments' if on mobile App) --> Schedule an Appointment  Under 'Select a Reason for Visit' choose the Mammogram Screening option.  Complete the pre-visit questions and select the time and place that best fits your schedule.    This is a list of the screening recommended for you and due dates:  Health Maintenance  Topic Date Due   COVID-19 Vaccine (1) Never done   Pap with HPV screening  Never done   Colon Cancer Screening  Never done   Yearly kidney health urinalysis for diabetes  07/25/2022   Complete foot exam   07/25/2022   Flu Shot  09/09/2022   Eye exam for diabetics  03/04/2023   Hemoglobin A1C  03/06/2023   Mammogram  08/08/2023   Yearly kidney function blood test for diabetes  09/03/2023   Medicare Annual Wellness Visit  11/16/2023   DTaP/Tdap/Td vaccine (2 - Td or Tdap) 11/28/2031   Hepatitis C Screening  Completed   HIV Screening  Completed   Zoster (Shingles) Vaccine  Completed   HPV Vaccine   Aged Out    Advanced directives: (ACP Link)Information on Advanced Care Planning can be found at [facility] Advance Health Care Directives Advance Health Care Directives ([email or web address]   Next Medicare Annual Wellness Visit scheduled for next year: Yes

## 2022-11-16 NOTE — Progress Notes (Signed)
Subjective:   Laurie Andrews is a 52 y.o. female who presents for Medicare Annual (Subsequent) preventive examination.  Visit Complete: Virtual I connected with  [name] on 11/16/22 by a audio enabled telemedicine application and verified that I am speaking with the correct person using two identifiers.  Patient Location: Home  Provider Location: Home Office  I discussed the limitations of evaluation and management by telemedicine. The patient expressed understanding and agreed to proceed.  Vital Signs: Because this visit was a virtual/telehealth visit, some criteria may be missing or patient reported. Any vitals not documented were not able to be obtained and vitals that have been documented are patient reported.  Cardiac Risk Factors include: diabetes mellitus;sedentary lifestyle     Objective:    Today's Vitals   11/16/22 1334  Weight: 184 lb (83.5 kg)  Height: 5\' 1"  (1.549 m)   Body mass index is 34.77 kg/m.     11/16/2022    2:29 PM 09/03/2022    7:33 PM 11/27/2021   11:17 AM 11/17/2021   11:32 AM 04/05/2020    1:08 PM 09/07/2019   10:02 AM 09/05/2019    6:55 PM  Advanced Directives  Does Patient Have a Medical Advance Directive? No No No No No No No  Would patient like information on creating a medical advance directive? Yes (MAU/Ambulatory/Procedural Areas - Information given)  Yes (ED - Information included in AVS) No - Patient declined No - Patient declined No - Patient declined No - Patient declined    Current Medications (verified) Outpatient Encounter Medications as of 11/16/2022  Medication Sig   Accu-Chek Softclix Lancets lancets Use as instructed   albuterol (PROVENTIL) (2.5 MG/3ML) 0.083% nebulizer solution INHALE 3 ML BY NEBULIZATION EVERY 6 HOURS AS NEEDED FOR WHEEZING OR SHORTNESS OF BREATH   albuterol (VENTOLIN HFA) 108 (90 Base) MCG/ACT inhaler INHALE 1-2 PUFFS INTO THE LUNGS DAILY AS NEEDED FOR WHEEZING OR SHORTNESS OF BREATH.   amLODipine  (NORVASC) 10 MG tablet TAKE 1 TABLET BY MOUTH EVERY DAY   atorvastatin (LIPITOR) 10 MG tablet TAKE 1 TABLET BY MOUTH EVERY DAY   BD PEN NEEDLE NANO 2ND GEN 32G X 4 MM MISC USE A TOTAL OF 4 PEN NEEDLES DAILY FOR INSULIN INJECTION.   cetirizine (ZYRTEC) 10 MG tablet Take 10 mg by mouth daily.   Cholecalciferol (VITAMIN D3) 25 MCG (1000 UT) capsule Take 1 capsule (1,000 Units total) by mouth daily.   Continuous Blood Gluc Receiver (FREESTYLE LIBRE 3 READER) DEVI 1 application  by Does not apply route in the morning, at noon, and at bedtime. Use to check blood sugar TID. E11.65   Continuous Glucose Sensor (FREESTYLE LIBRE 3 SENSOR) MISC USE TO CHECK BLOOD SUGAR THREE TIMES A DAY   diclofenac Sodium (VOLTAREN) 1 % GEL Apply 2 g topically 4 (four) times daily.   doxycycline (VIBRA-TABS) 100 MG tablet Take 1 tablet (100 mg total) by mouth 2 (two) times daily.   GAVILAX 17 GM/SCOOP powder Take 17 g by mouth daily as needed for mild constipation.   glucose blood (ACCU-CHEK GUIDE) test strip Use as instructed   insulin glargine, 1 Unit Dial, (TOUJEO SOLOSTAR) 300 UNIT/ML Solostar Pen Inject 46 Units into the skin daily.   insulin lispro (HUMALOG) 100 UNIT/ML KwikPen INJECT 10 UNITS SUBCUTANEOUSLY BEFORE BREAKFAST, 14 UNITS BEFORE LUNCH, AND 14 UNITS BEFORE DINNER.   losartan (COZAAR) 100 MG tablet TAKE 1 TABLET BY MOUTH EVERY DAY   montelukast (SINGULAIR) 10 MG tablet TAKE 1  TABLET BY MOUTH EVERY DAY   naloxone (NARCAN) nasal spray 4 mg/0.1 mL USE AS DIRECTED   naratriptan (AMERGE) 2.5 MG tablet TAKE 1 TABLET BY MOUTH AS NEEDED FOR MIGRAINE.   nystatin cream (MYCOSTATIN) Apply 1 application topically 3 (three) times daily as needed for dry skin.   ondansetron (ZOFRAN) 4 MG tablet Take 1 tablet (4 mg total) by mouth every 6 (six) hours.   ondansetron (ZOFRAN-ODT) 4 MG disintegrating tablet Take 1 tablet (4 mg total) by mouth every 8 (eight) hours as needed for nausea or vomiting.   oxyCODONE (OXY  IR/ROXICODONE) 5 MG immediate release tablet Take 5 mg by mouth in the morning, at noon, and at bedtime.   pantoprazole (PROTONIX) 40 MG tablet TAKE 1 TABLET (40 MG TOTAL) BY MOUTH 2 (TWO) TIMES DAILY. DX K21.9   promethazine-dextromethorphan (PROMETHAZINE-DM) 6.25-15 MG/5ML syrup Take 5 mLs by mouth 4 (four) times daily as needed.   Semaglutide,0.25 or 0.5MG /DOS, (OZEMPIC, 0.25 OR 0.5 MG/DOSE,) 2 MG/3ML SOPN Inject 0.5 mg into the skin once a week.   senna-docusate (SENOKOT-S) 8.6-50 MG tablet Take 1 tablet by mouth daily as needed for mild constipation.   topiramate (TOPAMAX) 25 MG tablet Take 50 mg by mouth 2 (two) times daily.   triamcinolone cream (KENALOG) 0.1 % APPLY TO AFFECTED AREA TWICE A DAY   WIXELA INHUB 100-50 MCG/ACT AEPB INHALE 1 PUFF INTO THE LUNGS TWICE A DAY   amitriptyline (ELAVIL) 100 MG tablet Take 100 mg by mouth as needed for sleep. (Patient not taking: Reported on 07/15/2022)   benzonatate (TESSALON) 100 MG capsule Take 1 capsule (100 mg total) by mouth 3 (three) times daily as needed. (Patient not taking: Reported on 07/15/2022)   No facility-administered encounter medications on file as of 11/16/2022.    Allergies (verified) Keflex [cephalexin], Metformin and related, Trulicity [dulaglutide], Aspirin, Morphine and codeine, and Penicillins   History: Past Medical History:  Diagnosis Date   Anxiety    Asthma    Diabetes mellitus without complication (HCC)    Hypertension    Past Surgical History:  Procedure Laterality Date   BREAST SURGERY     CHOLECYSTECTOMY     HIGH RISK BREAST EXCISION     KNEE SURGERY     Family History  Problem Relation Age of Onset   Breast cancer Maternal Aunt    Social History   Socioeconomic History   Marital status: Single    Spouse name: Not on file   Number of children: Not on file   Years of education: Not on file   Highest education level: Not on file  Occupational History   Not on file  Tobacco Use   Smoking status:  Never   Smokeless tobacco: Never  Vaping Use   Vaping status: Never Used  Substance and Sexual Activity   Alcohol use: Not Currently   Drug use: Never   Sexual activity: Not on file  Other Topics Concern   Not on file  Social History Narrative   Not on file   Social Determinants of Health   Financial Resource Strain: Low Risk  (11/16/2022)   Overall Financial Resource Strain (CARDIA)    Difficulty of Paying Living Expenses: Not hard at all  Food Insecurity: No Food Insecurity (11/16/2022)   Hunger Vital Sign    Worried About Running Out of Food in the Last Year: Never true    Ran Out of Food in the Last Year: Never true  Transportation Needs: No Transportation Needs (  11/16/2022)   PRAPARE - Administrator, Civil Service (Medical): No    Lack of Transportation (Non-Medical): No  Physical Activity: Insufficiently Active (11/16/2022)   Exercise Vital Sign    Days of Exercise per Week: 5 days    Minutes of Exercise per Session: 20 min  Stress: No Stress Concern Present (11/16/2022)   [employer] of Occupational Health - Occupational Stress Questionnaire    Feeling of Stress : Not at all  Social Connections: Socially Isolated (11/16/2022)   Social Connection and Isolation Panel [NHANES]    Frequency of Communication with Friends and Family: More than three times a week    Frequency of Social Gatherings with Friends and Family: Never    Attends Religious Services: Never    Database administrator or Organizations: No    Attends Engineer, structural: Never    Marital Status: Never married    Tobacco Counseling Counseling given: Not Answered   Clinical Intake:  Pre-visit preparation completed: Yes  Pain : No/denies pain     Diabetes: Yes CBG done?: No Did pt. bring in CBG monitor from home?: No  How often do you need to have someone help you when you read instructions, pamphlets, or other written materials from your doctor or pharmacy?: 1 -  Never  Interpreter Needed?: No  Information entered by :: Kandis Fantasia LPN   Activities of Daily Living    11/16/2022    2:28 PM 11/27/2021   11:17 AM  In your present state of health, do you have any difficulty performing the following activities:  Hearing? 0 0  Vision? 0 0  Difficulty concentrating or making decisions? 0 0  Walking or climbing stairs? 0 1  Dressing or bathing? 0 0  Doing errands, shopping? 0 0  Preparing Food and eating ? N N  Using the Toilet? N N  In the past six months, have you accidently leaked urine? N N  Do you have problems with loss of bowel control? N N  Managing your Medications? N N  Managing your Finances? N N  Housekeeping or managing your Housekeeping? N N    Patient Care Team: Marcine Matar, MD as PCP - General (Internal Medicine)  Indicate any recent Medical Services you may have received from other than [facility] providers in the past year (date may be approximate).     Assessment:   This is a routine wellness examination for Toby.  Hearing/Vision screen Hearing Screening - Comments:: Denies hearing difficulties   Vision Screening - Comments:: Wears rx glasses - up to date with [facility] exams with [facility]     Goals Addressed   None   Depression Screen    11/16/2022    2:27 PM 04/15/2022    9:28 AM 01/15/2022   11:16 AM 11/27/2021   11:17 AM 07/24/2021    2:08 PM 05/08/2020    9:59 AM  PHQ 2/9 Scores  PHQ - 2 Score 0 0 1 0 0 0  PHQ- 9 Score  2 2  1      Fall Risk    11/16/2022    2:28 PM 04/15/2022    9:17 AM 01/15/2022   11:12 AM 11/27/2021   11:17 AM 07/24/2021    2:08 PM  Fall Risk   Falls in the past year? 0 0 0 1 0  Number falls in past yr: 0 0 0 0 0  Injury with Fall? 0 0 0 1 0  Risk for fall due to : No Fall Risks No Fall Risks No Fall Risks    Follow up Falls prevention discussed;Education provided;Falls evaluation completed        MEDICARE RISK AT HOME: Medicare Risk at Home Any stairs in or  around the home?: No If so, are there any without handrails?: No Home free of loose throw rugs in walkways, pet beds, electrical cords, etc?: Yes Adequate lighting in your home to reduce risk of falls?: Yes Life alert?: No Use of a cane, walker or w/c?: No Grab bars in the bathroom?: Yes Shower chair or bench in shower?: No Elevated toilet seat or a handicapped toilet?: No  TIMED UP AND GO:  Was the test performed?  No    Cognitive Function:        11/16/2022    2:28 PM 11/27/2021   11:19 AM  6CIT Screen  What Year? 0 points 0 points  What month? 0 points 0 points  What time? 0 points 0 points  Count back from 20 0 points 0 points  Months in reverse 0 points 2 points  Repeat phrase 0 points 0 points  Total Score 0 points 2 points    Immunizations Immunization History  Administered Date(s) Administered   Influenza Split 04/11/2012   Influenza,inj,Quad PF,6+ Mos 05/08/2020, 11/27/2021   Tdap 11/27/2021   Zoster Recombinant(Shingrix) 10/15/2020, 07/24/2021    TDAP status: Up to date  Flu Vaccine status: Due, Education has been provided regarding the importance of this vaccine. Advised may receive this vaccine at local pharmacy or Health Dept. Aware to provide a copy of the vaccination record if obtained from local pharmacy or Health Dept. Verbalized acceptance and understanding.  Pneumococcal vaccine status: Up to date  Covid-19 vaccine status: Information provided on how to obtain vaccines.   Qualifies for Shingles Vaccine? Yes   Zostavax completed No   Shingrix Completed?: Yes  Screening Tests Health Maintenance  Topic Date Due   COVID-19 Vaccine (1) Never done   Cervical Cancer Screening (HPV/Pap Cotest)  Never done   Colonoscopy  Never done   Diabetic kidney evaluation - Urine ACR  07/25/2022   FOOT EXAM  07/25/2022   INFLUENZA VACCINE  09/09/2022   OPHTHALMOLOGY EXAM  03/04/2023   HEMOGLOBIN A1C  03/06/2023   MAMMOGRAM  08/08/2023   Diabetic kidney  evaluation - eGFR measurement  09/03/2023   Medicare Annual Wellness (AWV)  11/16/2023   DTaP/Tdap/Td (2 - Td or Tdap) 11/28/2031   Hepatitis C Screening  Completed   HIV Screening  Completed   Zoster Vaccines- Shingrix  Completed   HPV VACCINES  Aged Out    Health Maintenance  Health Maintenance Due  Topic Date Due   COVID-19 Vaccine (1) Never done   Cervical Cancer Screening (HPV/Pap Cotest)  Never done   Colonoscopy  Never done   Diabetic kidney evaluation - Urine ACR  07/25/2022   FOOT EXAM  07/25/2022   INFLUENZA VACCINE  09/09/2022    Colorectal cancer screening:  Patient declines at this time   Mammogram status: Ordered today. Pt provided with contact info and advised to call to schedule appt.   Lung Cancer Screening: (Low Dose CT Chest recommended if Age 37-80 years, 20 pack-year currently smoking OR have quit w/in 15years.) does not qualify.   Lung Cancer Screening Referral: n/a  Additional Screening:  Hepatitis C Screening: does qualify; Completed 11/27/21  Vision Screening: Recommended annual ophthalmology exams for early detection of glaucoma and other disorders  of the eye. Is the patient up to date with their annual eye exam?  Yes  Who is the provider or what is the name of the office in which the patient attends annual eye exams? [facility] If pt is not established with a provider, would they like to be referred to a provider to establish care? No .   Dental Screening: Recommended annual dental exams for proper oral hygiene  Diabetic Foot Exam: Diabetic Foot Exam: Overdue, Pt has been advised about the importance in completing this exam. Pt is scheduled for diabetic foot exam on at next office visit.  Community Resource Referral / Chronic Care Management: CRR required this visit?  No   CCM required this visit?  No     Plan:     I have personally reviewed and noted the following in the patient's chart:   Medical and social history Use of  alcohol, tobacco or illicit drugs  Current medications and supplements including opioid prescriptions. Patient is not currently taking opioid prescriptions. Functional ability and status Nutritional status Physical activity Advanced directives List of other physicians Hospitalizations, surgeries, and ER visits in previous 12 months Vitals Screenings to include cognitive, depression, and falls Referrals and appointments  In addition, I have reviewed and discussed with patient certain preventive protocols, quality metrics, and best practice recommendations. A written personalized care plan for preventive services as well as general preventive health recommendations were provided to patient.     Kandis Fantasia [place], [place]   [birth year]   After Visit Summary: (MyChart) Due to this being a telephonic visit, the after visit summary with patients personalized plan was offered to patient via MyChart   Nurse Notes: Patient is asking for a referral to podiatry due to possible neuropathy

## 2022-11-19 ENCOUNTER — Other Ambulatory Visit: Payer: Self-pay | Admitting: Internal Medicine

## 2022-11-19 DIAGNOSIS — E1165 Type 2 diabetes mellitus with hyperglycemia: Secondary | ICD-10-CM

## 2022-11-19 DIAGNOSIS — E1169 Type 2 diabetes mellitus with other specified complication: Secondary | ICD-10-CM

## 2022-11-19 MED ORDER — FREESTYLE LIBRE 3 SENSOR MISC: 3 refills | Status: DC

## 2022-11-19 NOTE — Telephone Encounter (Signed)
Requested Prescriptions  Pending Prescriptions Disp Refills   Continuous Glucose Sensor (FREESTYLE LIBRE 3 SENSOR) MISC 2 each 3    Sig: USE TO CHECK BLOOD SUGAR THREE TIMES A DAY     Endocrinology: Diabetes - Testing Supplies Passed - 11/19/2022  5:18 PM      Passed - Valid encounter within last 12 months    Recent Outpatient Visits           2 months ago Moderate nausea and vomiting   [place] [facility] [place], [place] L, RPH-CPP   5 months ago Type 2 diabetes mellitus with obesity [facility])   [place] [facility] [place], [place] L, RPH-CPP   6 months ago Type 2 diabetes mellitus with obesity [facility])   [place] [facility] [place], [place] L, RPH-CPP   7 months ago Type 2 diabetes mellitus with hyperglycemia, with long-term current use of insulin [facility])   [place] [facility] Jonah Blue B, MD   7 months ago Type 2 diabetes mellitus with obesity [facility])   [place] [facility] Drucilla Chalet, RPH-CPP       Future Appointments             In 4 days Lois Huxley, Cornelius Moras, RPH-CPP [facility]   In 3 weeks Laural Benes, Binnie Rail, MD [facility]

## 2022-11-19 NOTE — Telephone Encounter (Signed)
Copied from CRM [id number]. Topic: General - Other >> Nov 19, 2022  4:56 PM Everette C wrote: Reason for CRM: Medication Refill - Medication: Continuous Glucose Sensor (FREESTYLE LIBRE 3 SENSOR) MISC [[id number]]  Has the patient contacted their pharmacy? Yes.   (Agent: If no, request that the patient contact the pharmacy for the refill. If patient does not wish to contact the pharmacy document the reason why and proceed with request.) (Agent: If yes, when and what did the pharmacy advise?)  Preferred Pharmacy (with phone number or street name): [id number] [place], [place] - [address] AT [facility] [address] [facility] [address] [place] [address] Phone: [phone] Fax: [phone] Hours: Open 24 hours   Has the patient been seen for an appointment in the last year OR does the patient have an upcoming appointment? Yes.    Agent: Please be advised that RX refills may take up to 3 business days. We ask that you follow-up with your pharmacy.

## 2022-11-22 ENCOUNTER — Telehealth: Payer: Self-pay

## 2022-11-22 DIAGNOSIS — M79671 Pain in right foot: Secondary | ICD-10-CM

## 2022-11-22 DIAGNOSIS — E1169 Type 2 diabetes mellitus with other specified complication: Secondary | ICD-10-CM

## 2022-11-22 NOTE — Telephone Encounter (Signed)
Copied from CRM [id number]. Topic: Referral - Request for Referral >> Nov 19, 2022  4:59 PM Everette C wrote: Has patient seen PCP for this complaint? Yes.   *If NO, is insurance requiring patient see PCP for this issue before PCP can refer them? Referral for which specialty: Podiatry  Preferred provider/office: Patient has no preference  Reason for referral: difficulty walking

## 2022-11-23 ENCOUNTER — Ambulatory Visit: Payer: 59 | Admitting: Pharmacist

## 2022-11-24 NOTE — Addendum Note (Signed)
Addended by: Jonah Blue B on: 11/24/2022 03:40 PM   Modules accepted: Orders

## 2022-11-24 NOTE — Telephone Encounter (Signed)
Called & spoke to the patient. Verified name & DOB. Informed that a podiatry referral has been submitted. Patient expressed verbal understanding.

## 2022-12-02 ENCOUNTER — Ambulatory Visit (INDEPENDENT_AMBULATORY_CARE_PROVIDER_SITE_OTHER): Payer: 59

## 2022-12-02 ENCOUNTER — Ambulatory Visit: Payer: 59 | Admitting: Podiatry

## 2022-12-02 ENCOUNTER — Encounter: Payer: Self-pay | Admitting: Podiatry

## 2022-12-02 DIAGNOSIS — M722 Plantar fascial fibromatosis: Secondary | ICD-10-CM | POA: Diagnosis not present

## 2022-12-02 DIAGNOSIS — M778 Other enthesopathies, not elsewhere classified: Secondary | ICD-10-CM

## 2022-12-02 NOTE — Progress Notes (Signed)
Subjective:   Patient ID: Laurie Andrews, female   DOB: 52 y.o.   MRN: [id number]   HPI Patient presents new patient severe pain in the heel region left over right with also history of knee issues moderate obesity that she is trying to lose weight with and an intensive increase in the pain over the last few months with the patient smoking and trying to be active   Review of Systems  All other systems reviewed and are negative.       Objective:  Physical Exam Vitals and nursing note reviewed.  Constitutional:      Appearance: She is well-developed.  Pulmonary:     Effort: Pulmonary effort is normal.  Musculoskeletal:        General: Normal range of motion.  Skin:    General: Skin is warm.  Neurological:     Mental Status: She is alert.     Neurovascular status intact muscle strength adequate range of motion within normal limits with exquisite discomfort noted medial fascial band left over right with history of having had injections which were ineffective and also has diabetes A1c of 9 and needs to get it down before having knee replacements.  Good digital perfusion well-oriented     Assessment:  Acute plantar fasciitis left over right with fluid buildup around the medial band     Plan:  H&P reviewed at this point discussed treatment options and I applied an air fracture walker left properly fitted to her lower leg to take all stress off the plantar fascia since we cannot do any kind of more aggressive treatment and for the right I applied fascial brace.  X-rays indicate there is spur formation small there is no indication of stress fracture or advanced arthritis

## 2022-12-03 DIAGNOSIS — G894 Chronic pain syndrome: Secondary | ICD-10-CM | POA: Diagnosis not present

## 2022-12-03 DIAGNOSIS — G8929 Other chronic pain: Secondary | ICD-10-CM | POA: Diagnosis not present

## 2022-12-03 DIAGNOSIS — M25561 Pain in right knee: Secondary | ICD-10-CM | POA: Diagnosis not present

## 2022-12-03 DIAGNOSIS — M1710 Unilateral primary osteoarthritis, unspecified knee: Secondary | ICD-10-CM | POA: Diagnosis not present

## 2022-12-03 DIAGNOSIS — M545 Low back pain, unspecified: Secondary | ICD-10-CM | POA: Diagnosis not present

## 2022-12-03 DIAGNOSIS — Z79891 Long term (current) use of opiate analgesic: Secondary | ICD-10-CM | POA: Diagnosis not present

## 2022-12-03 DIAGNOSIS — M25562 Pain in left knee: Secondary | ICD-10-CM | POA: Diagnosis not present

## 2022-12-03 DIAGNOSIS — M5431 Sciatica, right side: Secondary | ICD-10-CM | POA: Diagnosis not present

## 2022-12-05 ENCOUNTER — Other Ambulatory Visit: Payer: Self-pay | Admitting: Internal Medicine

## 2022-12-05 NOTE — Progress Notes (Unsigned)
S:    PCP: Dr. Laural Benes  52 y.o. female who presents for diabetes evaluation, education, and management.  PMH is significant for 2DM w/ peripheral neuropathy, HTN, opiate dependence, anxiety disorder, asthma, migraines, OA knees, alopecia. Patient was referred by Primary Care Provider, Dr. Laural Benes, on 04/15/2022. Last seen by pharmacy clinic on 06/17/2022. We adjusted her Ozempic and Lantus doses. I also got PA approval to change from Lantus to Toujeo.  Last pharmacist visit on 7/26. Pt endorsed 2 week hx of nausea vomiting. 8/10 epigastric pain that had been coming and going for 2 weeks. Reported she had been taking Ozempic 0.5 mg, but was dispensed the fill hx showed she was dispensed the 2 mg pen. Ozempic was stopped d/t concern for GLP-1 induced pancreatitis and she was referred to the urgent care/ED for further work up. Toujeo increased from 38 units to 46 units daily, Humalog 11/22/22 continued. ED work up was negative and patient was given insulin, fluids, and Zofran and discharged home to f/u with PCP.   Today ***  Today, patient arrives in poor spirits and presents without any assistance. Endorses 2 week hx of nausea vomiting. Drinking gingerale, water, and Gatorade Zero. Patient has FreeStyle Libre 3 CGM in place. Her CGM data is summarized below. 8/10 epigastric abdominal pain. Started 08/19/2022. Has been coming and going every day since. Denies any fever but endorses chills. During my encounter she had emesis that appeared bilious. We were able to administer PO ondansetron that she kept down.   Current diabetes medications include: Toujeo 46 units daily, Humalog 11/21/12 units TID Previously tried medications: Ozempic (stopped d/t severe nausea/vomiting) Patient reports adherence to taking all medications as prescribed.   Insurance coverage: UHC Medicare + Medicaid   Patient denies hypoglycemic events since last visit.   Reported home fasting blood sugars: see CGM data below    Patient reports nocturia (nighttime urination).  Patient reports neuropathy (nerve pain). Patient denies visual changes. Patient reports self foot exams.  Patient reported  Breakfast: glucose shake Lunch: tuna fish sandwich, 2 piece of bread Dinner: baked chicken, rice (1 cup), veggies   Snacks: reports not snacking Beverages: water, coffee (sugar creamer)  Exercise: walks around complex (15-20 mins) 3 times a week; starting physical therapy   O:  Date of Download: 10/14-10/27 Time CGM Active: 93% Average Glucose: 257 Glucose Management Indicator: 9.5% Variability: 33.8% - Time very high: 53% - Time high: 28% - Time in range: 18% - Time low: 1% Observed patterns: lows overnight, hyperglycemia throughout the day/afternoon    Lab Results  Component Value Date   HGBA1C 11.1 (A) 09/03/2022   There were no vitals filed for this visit.  Lipid Panel     Component Value Date/Time   CHOL 177 07/24/2021 1501   TRIG 259 (H) 07/24/2021 1501   HDL 46 07/24/2021 1501   CHOLHDL 3.8 07/24/2021 1501   LDLCALC 88 07/24/2021 1501    Clinical Atherosclerotic Cardiovascular Disease (ASCVD): No  The 10-year ASCVD risk score (Arnett DK, et al., 2019) is: 24%   Values used to calculate the score:     Age: 62 years     Sex: Female     Is Non-Hispanic African American: Yes     Diabetic: Yes     Tobacco smoker: No     Systolic Blood Pressure: 160 mmHg     Is BP treated: Yes     HDL Cholesterol: 46 mg/dL     Total Cholesterol: 177 mg/dL  A/P: Diabetes longstanding, currently above goal based on today's A1c. Given her presentation, we are sending her to Urgent Care. Suspect pancreatitis or possible DKA in the setting of dehydration. Will stop Ozempic given the possibility of GLP-induced pancreatitis.  -Increase Lantus to 46 units every morning.  -Continue Humalog 10 units with breakfast and 14 units with lunch and supper. -Stop Ozempic. -Patient sent to [facility] for  further eval. -Defer initiation of SGLT2-I  until A1c closer to goal.  -Intolerant to metformin. -Patient educated on purpose, proper use, and potential adverse effects of insulin.  -Extensively discussed pathophysiology of diabetes, recommended lifestyle interventions, dietary effects on blood sugar control.  -Counseled on s/sx of and management of hypoglycemia.  -Next A1c anticipated October 2024.   Written patient instructions provided. Patient verbalized understanding of treatment plan.  Total time in face to face counseling 30 minutes.    Follow-up:  Pharmacist: 2-3 weeks.  Butch Penny, PharmD, P[place]y [place], CPP Clinical Pharmacist [facility] [phone]

## 2022-12-06 ENCOUNTER — Ambulatory Visit: Payer: 59 | Attending: Family Medicine | Admitting: Pharmacist

## 2022-12-06 ENCOUNTER — Encounter: Payer: Self-pay | Admitting: Pharmacist

## 2022-12-06 DIAGNOSIS — E1169 Type 2 diabetes mellitus with other specified complication: Secondary | ICD-10-CM

## 2022-12-06 DIAGNOSIS — E669 Obesity, unspecified: Secondary | ICD-10-CM

## 2022-12-06 DIAGNOSIS — Z794 Long term (current) use of insulin: Secondary | ICD-10-CM

## 2022-12-06 LAB — POCT GLYCOSYLATED HEMOGLOBIN (HGB A1C): HbA1c, POC (controlled diabetic range): 10.2 % — AB (ref 0.0–7.0)

## 2022-12-06 MED ORDER — INSULIN LISPRO (1 UNIT DIAL) 100 UNIT/ML (KWIKPEN): PEN_INJECTOR | SUBCUTANEOUS | 2 refills | Status: DC

## 2022-12-06 MED ORDER — TOUJEO SOLOSTAR 300 UNIT/ML ~~LOC~~ SOPN: 32.0000 [IU] | PEN_INJECTOR | Freq: Every day | SUBCUTANEOUS | 3 refills | Status: DC

## 2022-12-14 ENCOUNTER — Encounter: Payer: Self-pay | Admitting: Internal Medicine

## 2022-12-14 ENCOUNTER — Ambulatory Visit: Payer: 59 | Admitting: Internal Medicine

## 2022-12-23 ENCOUNTER — Ambulatory Visit: Payer: 59 | Admitting: Podiatry

## 2022-12-24 IMAGING — DX DG KNEE AP/LAT W/ SUNRISE*L*
3 series · 3 of 3 positions shown · non-contrast
Comparison: 05/20/2020

CLINICAL DATA: Knee pain

EXAM:
LEFT KNEE 3 VIEWS

[knee ap]
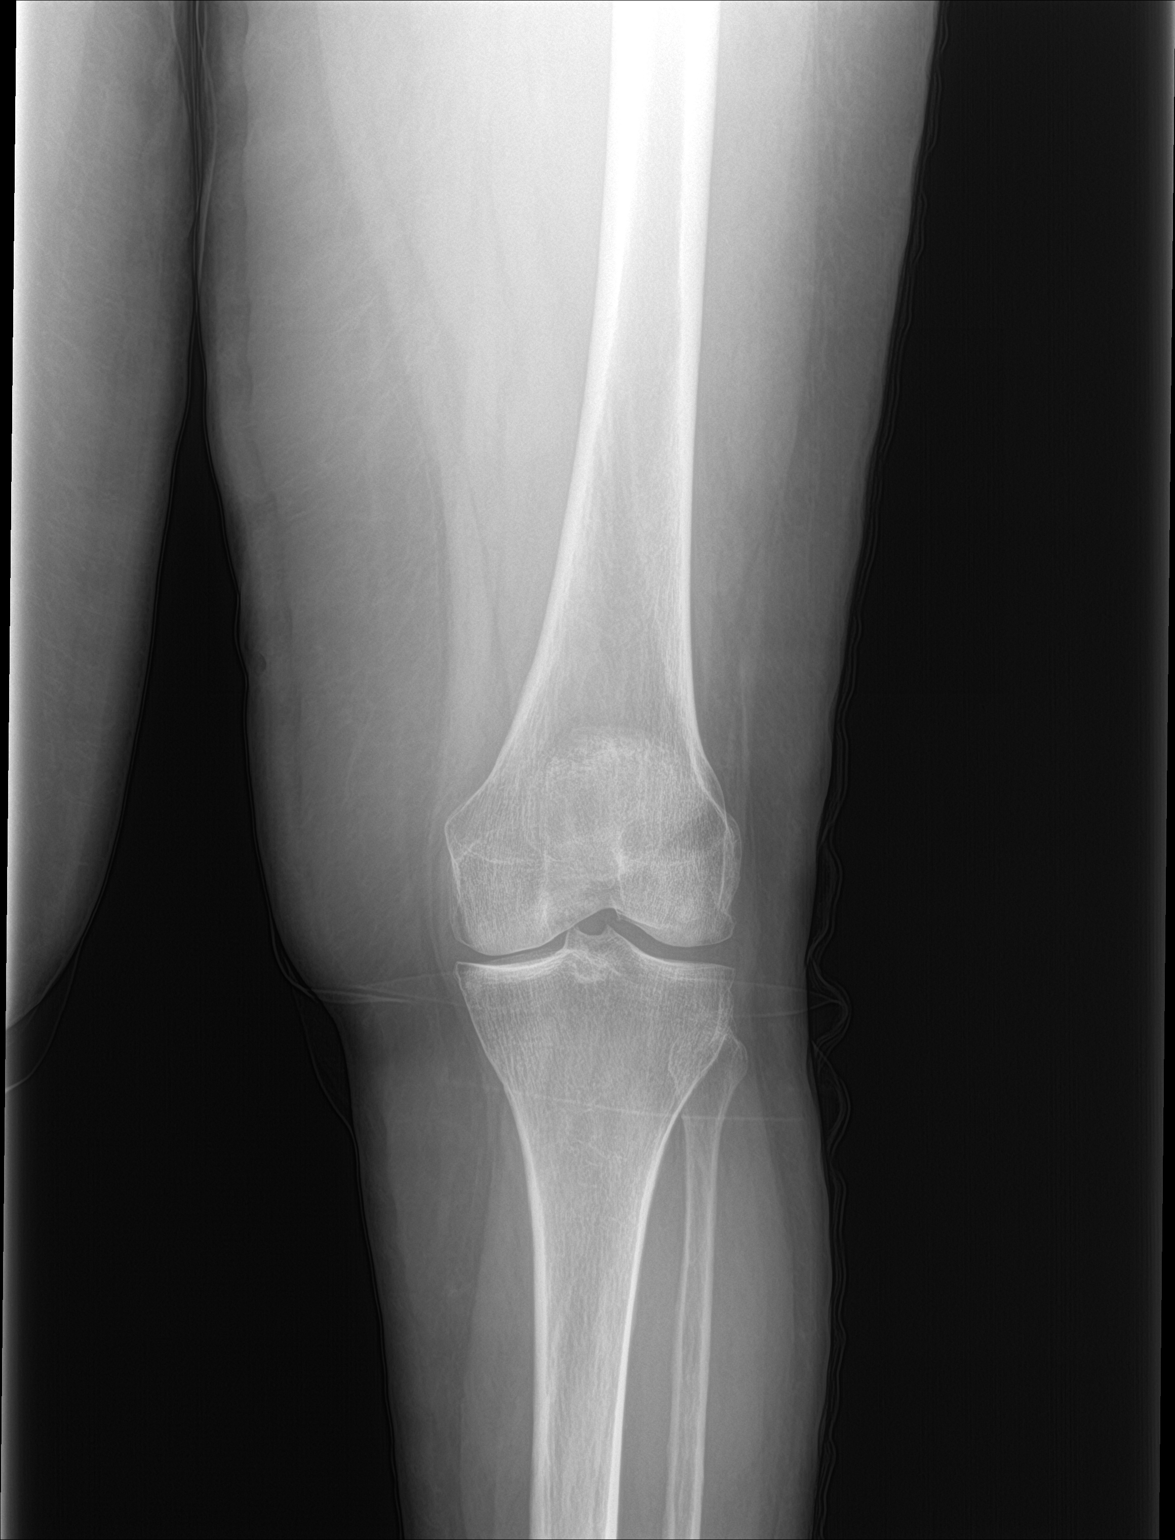

[knee lat]
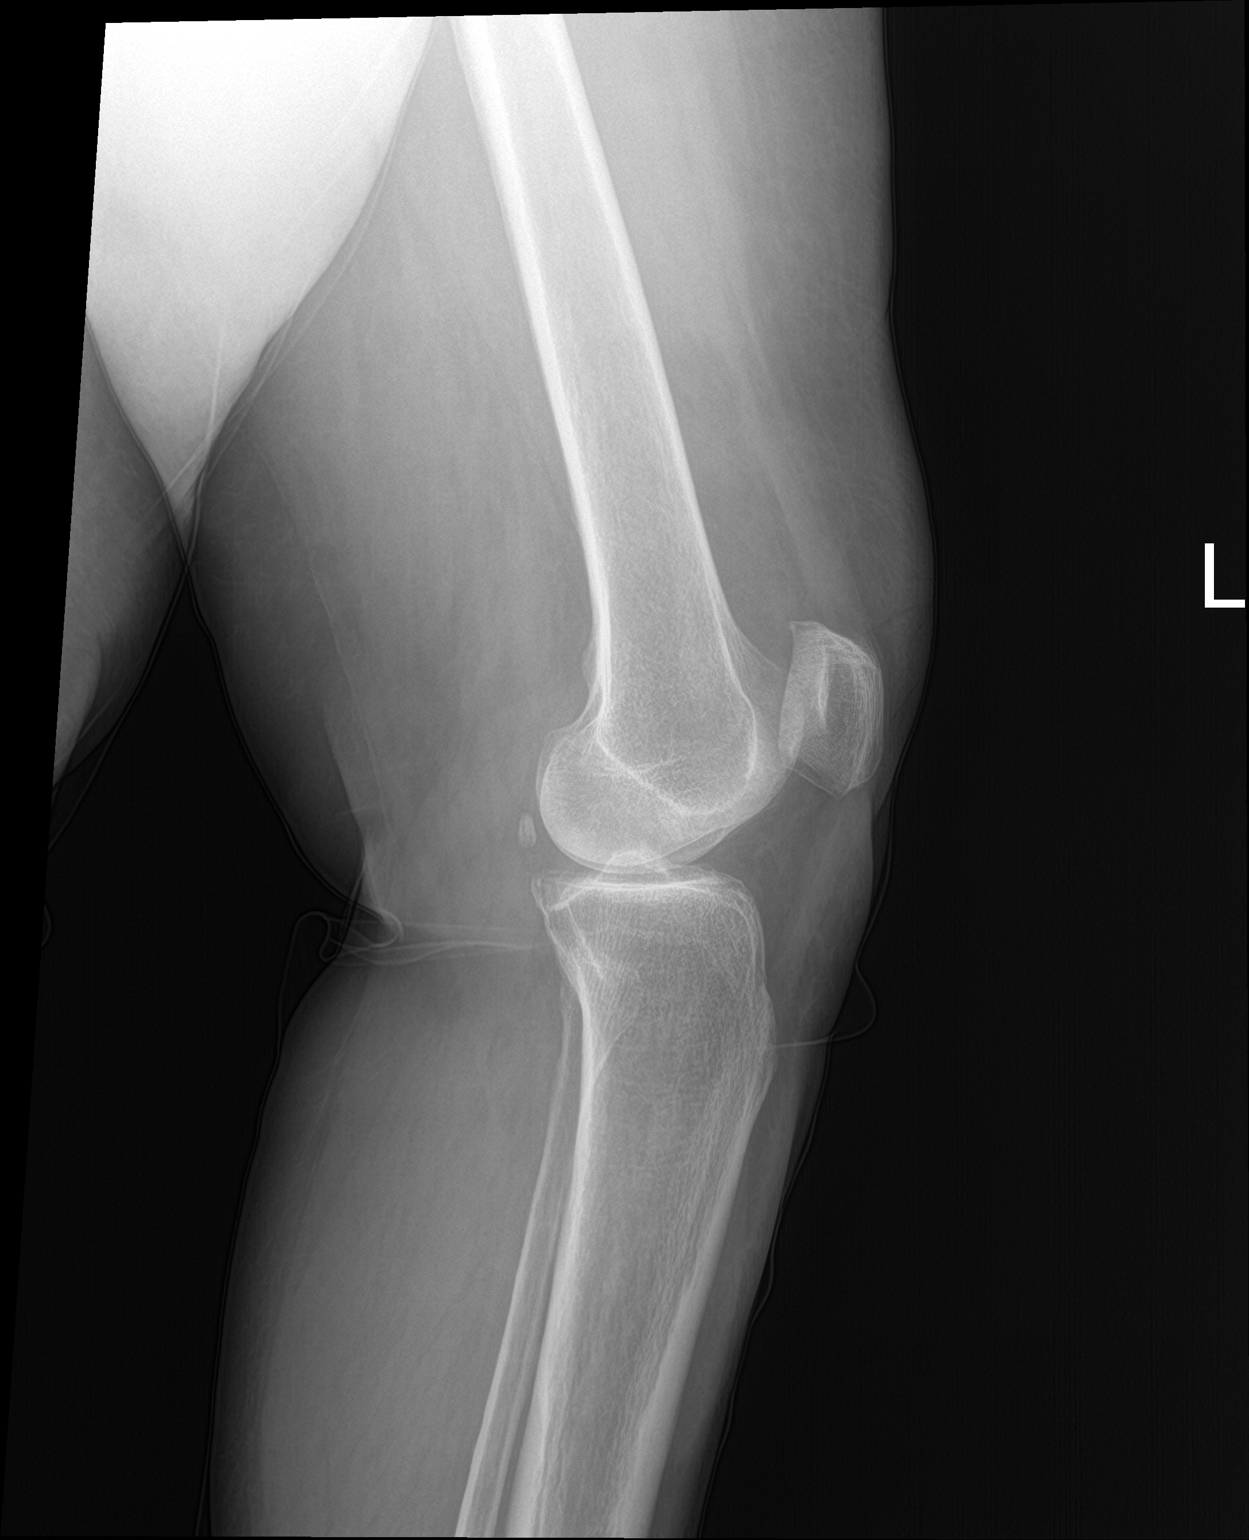

[knee sunrise]
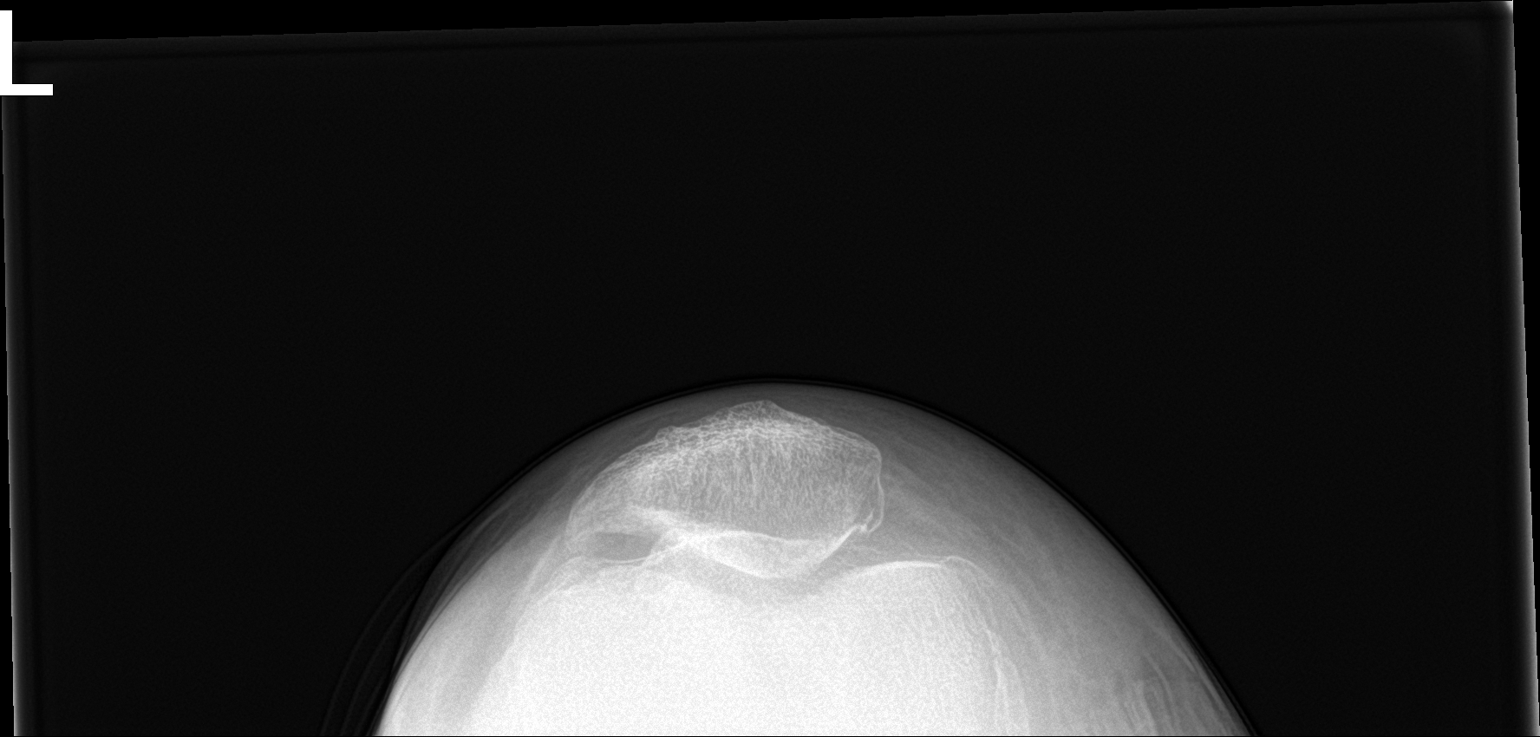

[3 of 3 positions shown; findings below may reference images not displayed]

FINDINGS: No fracture or malalignment. Mild medial and patellofemoral
degenerative change. No sizable effusion
IMPRESSION: Mild degenerative changes

## 2022-12-31 DIAGNOSIS — G8929 Other chronic pain: Secondary | ICD-10-CM | POA: Diagnosis not present

## 2022-12-31 DIAGNOSIS — M25561 Pain in right knee: Secondary | ICD-10-CM | POA: Diagnosis not present

## 2022-12-31 DIAGNOSIS — M5431 Sciatica, right side: Secondary | ICD-10-CM | POA: Diagnosis not present

## 2022-12-31 DIAGNOSIS — M25562 Pain in left knee: Secondary | ICD-10-CM | POA: Diagnosis not present

## 2023-01-03 ENCOUNTER — Other Ambulatory Visit: Payer: Self-pay | Admitting: Internal Medicine

## 2023-01-03 DIAGNOSIS — K219 Gastro-esophageal reflux disease without esophagitis: Secondary | ICD-10-CM

## 2023-01-03 DIAGNOSIS — E1159 Type 2 diabetes mellitus with other circulatory complications: Secondary | ICD-10-CM

## 2023-01-11 ENCOUNTER — Ambulatory Visit: Payer: 59 | Admitting: Pharmacist

## 2023-01-13 ENCOUNTER — Ambulatory Visit: Payer: 59 | Admitting: Podiatry

## 2023-01-28 ENCOUNTER — Other Ambulatory Visit: Payer: Self-pay | Admitting: Internal Medicine

## 2023-01-28 DIAGNOSIS — J4540 Moderate persistent asthma, uncomplicated: Secondary | ICD-10-CM

## 2023-01-28 DIAGNOSIS — Z79891 Long term (current) use of opiate analgesic: Secondary | ICD-10-CM | POA: Diagnosis not present

## 2023-01-28 DIAGNOSIS — M25561 Pain in right knee: Secondary | ICD-10-CM | POA: Diagnosis not present

## 2023-01-28 DIAGNOSIS — M25562 Pain in left knee: Secondary | ICD-10-CM | POA: Diagnosis not present

## 2023-01-28 DIAGNOSIS — M5431 Sciatica, right side: Secondary | ICD-10-CM | POA: Diagnosis not present

## 2023-01-28 DIAGNOSIS — G894 Chronic pain syndrome: Secondary | ICD-10-CM | POA: Diagnosis not present

## 2023-01-28 DIAGNOSIS — M1710 Unilateral primary osteoarthritis, unspecified knee: Secondary | ICD-10-CM | POA: Diagnosis not present

## 2023-01-28 NOTE — Telephone Encounter (Signed)
Requested Prescriptions  Pending Prescriptions Disp Refills   WIXELA INHUB 100-50 MCG/ACT AEPB [Pharmacy Med Name: WIXELA 100-50 INHUB] 60 each 2    Sig: INHALE 1 PUFF INTO THE LUNGS TWICE A DAY     Pulmonology:  [employer] Passed - 01/28/2023 10:34 AM      Passed - Valid encounter within last 12 months    Recent Outpatient Visits           1 month ago Type 2 diabetes mellitus with obesity (HCC)   [facility] [place]. [facility] Lois Huxley, [place] L, RPH-CPP   4 months ago Moderate nausea and vomiting   [facility] Dept Of [place]. [facility] Lois Huxley, [place] L, RPH-CPP   7 months ago Type 2 diabetes mellitus with obesity [facility])   [facility] Of [place]. [facility] Lois Huxley, [place] L, RPH-CPP   8 months ago Type 2 diabetes mellitus with obesity [facility])   [facility] - A Dept Of [place]. [facility] Lois Huxley, [place] L, RPH-CPP   9 months ago Type 2 diabetes mellitus with hyperglycemia, with long-term current use of insulin (HCC)   [facility] - A Dept Of [place]. [facility] Marcine Matar, MD       Future Appointments             In 1 month Laural Benes Binnie Rail, MD [facility] - A Dept Of [place]ideg[place]. [facility]

## 2023-01-31 ENCOUNTER — Ambulatory Visit: Payer: Self-pay | Admitting: *Deleted

## 2023-01-31 NOTE — Telephone Encounter (Signed)
noted 

## 2023-01-31 NOTE — Telephone Encounter (Signed)
Reason for Disposition  [1] Longstanding difficulty breathing (e.g., CHF, COPD, emphysema) AND [2] WORSE than normal  Answer Assessment - Initial Assessment Questions 1. RESPIRATORY STATUS: "Describe your breathing?" (e.g., wheezing, shortness of breath, unable to speak, severe coughing)      Congestion, coughing, nausea, diarrhea 2. ONSET: "When did this breathing problem begin?"      Started 2 weeks ago 3. PATTERN "Does the difficult breathing come and go, or has it been constant since it started?"      Going outside gets worse 4. SEVERITY: "How bad is your breathing?" (e.g., mild, moderate, severe)    - MILD: No SOB at rest, mild SOB with walking, speaks normally in sentences, can lie down, no retractions, pulse < 100.    - MODERATE: SOB at rest, SOB with minimal exertion and prefers to sit, cannot lie down flat, speaks in phrases, mild retractions, audible wheezing, pulse 100-120.    - SEVERE: Very SOB at rest, speaks in single words, struggling to breathe, sitting hunched forward, retractions, pulse > 120      moderate 5. RECURRENT SYMPTOM: "Have you had difficulty breathing before?" If Yes, ask: "When was the last time?" and "What happened that time?"      Yes- flu  7. LUNG HISTORY: "Do you have any history of lung disease?"  (e.g., pulmonary embolus, asthma, emphysema)     Asthma hx 8. CAUSE: "What do you think is causing the breathing problem?"      Flu type- grandson- flu 9. OTHER SYMPTOMS: "Do you have any other symptoms? (e.g., dizziness, runny nose, cough, chest pain, fever)     cough  12. TRAVEL: "Have you traveled out of the country in the last month?" (e.g., travel history, exposures)       Exposure to sick child  Protocols used: Breathing Difficulty-A-AH

## 2023-01-31 NOTE — Telephone Encounter (Signed)
  Chief Complaint: flu like symptoms- cough, SOB- with exertion Symptoms: cough- green phlegm, sore throat, hot- has not taken temperature, hx asthma- SOB with exertion- patient reports her grandson was recently sick Frequency: 2 weeks Pertinent Negatives: Patient denies dizziness, chest pain Disposition: [] ED /[] Urgent Care (no appt availability in office) / [] Appointment(In office/virtual)/ [x]  [facility]/ [] Home Care/ [] Refused Recommended Disposition /[] [facility]/ []  Follow-up with PCP Additional Notes: Patient has been scheduled for virtual UC- she does not want to go out in could- she states it makes her worse.

## 2023-02-01 ENCOUNTER — Telehealth: Payer: 59

## 2023-02-01 DIAGNOSIS — R110 Nausea: Secondary | ICD-10-CM | POA: Diagnosis not present

## 2023-02-01 DIAGNOSIS — B9689 Other specified bacterial agents as the cause of diseases classified elsewhere: Secondary | ICD-10-CM

## 2023-02-01 DIAGNOSIS — J069 Acute upper respiratory infection, unspecified: Secondary | ICD-10-CM | POA: Diagnosis not present

## 2023-02-01 MED ORDER — ONDANSETRON 4 MG PO TBDP: 4.0000 mg | ORAL_TABLET | Freq: Three times a day (TID) | ORAL | 0 refills | Status: DC | PRN

## 2023-02-01 MED ORDER — BENZONATATE 200 MG PO CAPS
200.0000 mg | ORAL_CAPSULE | Freq: Two times a day (BID) | ORAL | 0 refills | Status: DC | PRN
Start: 2023-02-01 — End: 2023-04-27

## 2023-02-01 MED ORDER — DOXYCYCLINE HYCLATE 100 MG PO TABS: 100.0000 mg | ORAL_TABLET | Freq: Two times a day (BID) | ORAL | 0 refills | Status: AC

## 2023-02-01 NOTE — Progress Notes (Signed)
Virtual Visit Consent   Laurie Andrews, you are scheduled for a virtual visit with a [facility] provider today. Just as with appointments in the office, your consent must be obtained to participate. Your consent will be active for this visit and any virtual visit you may have with one of our providers in the next 365 days. If you have a MyChart account, a copy of this consent can be sent to you electronically.  As this is a virtual visit, video technology does not allow for your provider to perform a traditional examination. This may limit your provider's ability to fully assess your condition. If your provider identifies any concerns that need to be evaluated in person or the need to arrange testing (such as labs, EKG, etc.), we will make arrangements to do so. Although advances in technology are sophisticated, we cannot ensure that it will always work on either your end or our end. If the connection with a video visit is poor, the visit may have to be switched to a telephone visit. With either a video or telephone visit, we are not always able to ensure that we have a secure connection.  By engaging in this virtual visit, you consent to the provision of healthcare and authorize for your insurance to be billed (if applicable) for the services provided during this visit. Depending on your insurance coverage, you may receive a charge related to this service.  I need to obtain your verbal consent now. Are you willing to proceed with your visit today? Laurie Andrews has provided verbal consent on 02/01/2023 for a virtual visit (video or telephone). Georgana Curio, FNP  Date: 02/01/2023 8:13 AM  Virtual Visit via Video Note   I, Georgana Curio, connected with  Laurie Andrews  ([id number], Apr 25, 1970) on 02/01/23 at  8:15 AM EST by a video-enabled telemedicine application and verified that I am speaking with the correct person using two identifiers.  Location: Patient: Virtual Visit Location Patient:  Home Provider: Virtual Visit Location Provider: Home Office   I discussed the limitations of evaluation and management by telemedicine and the availability of in person appointments. The patient expressed understanding and agreed to proceed.    History of Present Illness: Laurie Andrews is a 52 y.o. who identifies as a female who was assigned female at birth, and is being seen today for coughing, headache, sinus pressure and pain for over a week worsening. No wheezing or sob. In no distress.Marland Kitchen  HPI: HPI  Problems:  Patient Active Problem List   Diagnosis Date Noted   Hyperlipidemia 07/25/2021   Influenza vaccine needed 05/08/2020   Obesity (BMI 30.0-34.9) 05/08/2020   Moderate persistent asthma without complication 05/08/2020   Post-traumatic osteoarthritis of both knees 05/08/2020   Acute pyelonephritis 04/09/2020   Constipation 04/09/2020   Type 2 diabetes mellitus with hyperglycemia (HCC) 04/08/2020   Essential hypertension 04/08/2020    Allergies:  Allergies  Allergen Reactions   Keflex [Cephalexin] Nausea And Vomiting   Metformin And Related    Trulicity [Dulaglutide] Nausea And Vomiting   Aspirin Nausea And Vomiting and Rash   Morphine And Codeine Itching and Rash   Penicillins Nausea And Vomiting and Rash   Medications:  Current Outpatient Medications:    Accu-Chek Softclix Lancets lancets, Use as instructed, Disp: 100 each, Rfl: 12   albuterol (PROVENTIL) (2.5 MG/3ML) 0.083% nebulizer solution, INHALE 3 ML BY NEBULIZATION EVERY 6 HOURS AS NEEDED FOR WHEEZING OR SHORTNESS OF BREATH, Disp: 150 mL, Rfl: 0   albuterol (VENTOLIN  HFA) 108 (90 Base) MCG/ACT inhaler, INHALE 1-2 PUFFS INTO THE LUNGS DAILY AS NEEDED FOR WHEEZING OR SHORTNESS OF BREATH., Disp: 25.5 each, Rfl: 0   amitriptyline (ELAVIL) 100 MG tablet, Take 100 mg by mouth as needed for sleep., Disp: , Rfl:    amLODipine (NORVASC) 10 MG tablet, TAKE 1 TABLET BY MOUTH EVERY DAY, Disp: 90 tablet, Rfl: 0    atorvastatin (LIPITOR) 10 MG tablet, TAKE 1 TABLET BY MOUTH EVERY DAY, Disp: 90 tablet, Rfl: 0   BD PEN NEEDLE NANO 2ND GEN 32G X 4 MM MISC, USE A TOTAL OF 4 PEN NEEDLES DAILY FOR INSULIN INJECTION., Disp: 100 each, Rfl: 3   benzonatate (TESSALON) 100 MG capsule, Take 1 capsule (100 mg total) by mouth 3 (three) times daily as needed., Disp: 30 capsule, Rfl: 0   cetirizine (ZYRTEC) 10 MG tablet, Take 10 mg by mouth daily., Disp: , Rfl:    Cholecalciferol (VITAMIN D3) 25 MCG (1000 UT) capsule, Take 1 capsule (1,000 Units total) by mouth daily., Disp: 100 capsule, Rfl: 1   Continuous Blood Gluc Receiver (FREESTYLE LIBRE 3 READER) DEVI, 1 application  by Does not apply route in the morning, at noon, and at bedtime. Use to check blood sugar TID. E11.65, Disp: 1 each, Rfl: 0   Continuous Glucose Sensor (FREESTYLE LIBRE 3 SENSOR) MISC, USE TO CHECK BLOOD SUGAR THREE TIMES A DAY, Disp: 2 each, Rfl: 3   diclofenac Sodium (VOLTAREN) 1 % GEL, Apply 2 g topically 4 (four) times daily., Disp: 50 g, Rfl: 0   doxycycline (VIBRA-TABS) 100 MG tablet, Take 1 tablet (100 mg total) by mouth 2 (two) times daily., Disp: 20 tablet, Rfl: 0   GAVILAX 17 GM/SCOOP powder, Take 17 g by mouth daily as needed for mild constipation., Disp: , Rfl:    glucose blood (ACCU-CHEK GUIDE) test strip, Use as instructed, Disp: 100 each, Rfl: 12   insulin glargine, 1 Unit Dial, (TOUJEO SOLOSTAR) 300 UNIT/ML Solostar Pen, Inject 32 Units into the skin daily., Disp: 4.5 mL, Rfl: 3   insulin lispro (HUMALOG) 100 UNIT/ML KwikPen, Inject 15 units three times with meals., Disp: 12 mL, Rfl: 2   losartan (COZAAR) 100 MG tablet, TAKE 1 TABLET BY MOUTH EVERY DAY, Disp: 90 tablet, Rfl: 0   montelukast (SINGULAIR) 10 MG tablet, TAKE 1 TABLET BY MOUTH EVERY DAY, Disp: 90 tablet, Rfl: 0   naloxone (NARCAN) nasal spray 4 mg/0.1 mL, USE AS DIRECTED, Disp: 2 each, Rfl: 0   naratriptan (AMERGE) 2.5 MG tablet, TAKE 1 TABLET BY MOUTH AS NEEDED FOR MIGRAINE.,  Disp: 10 tablet, Rfl: 1   nystatin cream (MYCOSTATIN), Apply 1 application topically 3 (three) times daily as needed for dry skin., Disp: , Rfl:    ondansetron (ZOFRAN) 4 MG tablet, Take 1 tablet (4 mg total) by mouth every 6 (six) hours., Disp: 18 tablet, Rfl: 0   ondansetron (ZOFRAN-ODT) 4 MG disintegrating tablet, Take 1 tablet (4 mg total) by mouth every 8 (eight) hours as needed for nausea or vomiting., Disp: 20 tablet, Rfl: 0   oxyCODONE (OXY IR/ROXICODONE) 5 MG immediate release tablet, Take 5 mg by mouth in the morning, at noon, and at bedtime., Disp: , Rfl:    pantoprazole (PROTONIX) 40 MG tablet, TAKE 1 TABLET BY MOUTH TWICE A DAY, Disp: 180 tablet, Rfl: 0   promethazine-dextromethorphan (PROMETHAZINE-DM) 6.25-15 MG/5ML syrup, Take 5 mLs by mouth 4 (four) times daily as needed., Disp: 118 mL, Rfl: 0   Semaglutide,0.25 or 0.5MG /DOS, (  OZEMPIC, 0.25 OR 0.5 MG/DOSE,) 2 MG/3ML SOPN, Inject 0.5 mg into the skin once a week., Disp: 3 mL, Rfl: 1   senna-docusate (SENOKOT-S) 8.6-50 MG tablet, Take 1 tablet by mouth daily as needed for mild constipation., Disp: 30 tablet, Rfl: 1   topiramate (TOPAMAX) 25 MG tablet, Take 50 mg by mouth 2 (two) times daily., Disp: , Rfl:    triamcinolone cream (KENALOG) 0.1 %, APPLY TO AFFECTED AREA TWICE A DAY, Disp: 30 g, Rfl: 0   WIXELA INHUB 100-50 MCG/ACT AEPB, INHALE 1 PUFF INTO THE LUNGS TWICE A DAY, Disp: 60 each, Rfl: 2  Observations/Objective: Patient is well-developed, well-nourished in no acute distress.  Resting comfortably  at home.  Head is normocephalic, atraumatic.  No labored breathing.  Speech is clear and coherent with logical content.  Patient is alert and oriented at baseline.    Assessment and Plan: 1. Bacterial upper respiratory infection  Increase fluids, humidifier at night, tylenol or ibuprofen, UC as needed.   Follow Up Instructions: I discussed the assessment and treatment plan with the patient. The patient was provided an  opportunity to ask questions and all were answered. The patient agreed with the plan and demonstrated an understanding of the instructions.  A copy of instructions were sent to the patient via MyChart unless otherwise noted below.     The patient was advised to call back or seek an in-person evaluation if the symptoms worsen or if the condition fails to improve as anticipated.    Georgana Curio, FNP

## 2023-02-01 NOTE — Patient Instructions (Signed)

## 2023-02-05 ENCOUNTER — Other Ambulatory Visit: Payer: Self-pay | Admitting: Internal Medicine

## 2023-02-08 ENCOUNTER — Other Ambulatory Visit: Payer: Self-pay | Admitting: Internal Medicine

## 2023-02-08 ENCOUNTER — Ambulatory Visit: Payer: Self-pay | Admitting: *Deleted

## 2023-02-08 DIAGNOSIS — J4540 Moderate persistent asthma, uncomplicated: Secondary | ICD-10-CM

## 2023-02-08 NOTE — Telephone Encounter (Signed)
  Chief Complaint: SOB Symptoms: SOB, wheezing, cough, greenish. Right sided back pain with deep breaths. Frequency: Weeks Pertinent Negatives: Patient denies  Disposition: [] ED /[x] Urgent Care (no appt availability in office) / [] Appointment(In office/virtual)/ []  [facility]/ [] Home Care/ [] Refused Recommended Disposition /[] [facility]/ []  Follow-up with PCP Additional Notes:  Pt did E-visit 02/01/23, on ATBs Something for nausea and cough.  Requesting prednisone , advised would need appt, none available. Advised UC, declines They will charge me. States Prednisone  will help.  Reiterated need for UC/ED, declines. Please advise. Reason for Disposition  [1] Longstanding difficulty breathing (e.g., CHF, COPD, emphysema) AND [2] WORSE than normal  Answer Assessment - Initial Assessment Questions 1. RESPIRATORY STATUS: Describe your breathing? (e.g., wheezing, shortness of breath, unable to speak, severe coughing)      wheezing 2. ONSET: When did this breathing problem begin?      Weeks 3. PATTERN Does the difficult breathing come and go, or has it been constant since it started?      varies 4. SEVERITY: How bad is your breathing? (e.g., mild, moderate, severe)    - MILD: No SOB at rest, mild SOB with walking, speaks normally in sentences, can lie down, no retractions, pulse < 100.    - MODERATE: SOB at rest, SOB with minimal exertion and prefers to sit, cannot lie down flat, speaks in phrases, mild retractions, audible wheezing, pulse 100-120.    - SEVERE: Very SOB at rest, speaks in single words, struggling to breathe, sitting hunched forward, retractions, pulse > 120      SOB at rest 5. RECURRENT SYMPTOM: Have you had difficulty breathing before? If Yes, ask: When was the last time? and What happened that time?      Yes, asthma 7. LUNG HISTORY: Do you have any history of lung disease?  (e.g., pulmonary embolus, asthma, emphysema)     Asthma 8.  CAUSE: What do you think is causing the breathing problem?      Asthma 9. OTHER SYMPTOMS: Do you have any other symptoms? (e.g., dizziness, runny nose, cough, chest pain, fever)    Cough, green mucous, deep breathing, hurts right side back, inhalers help some  Protocols used: Breathing Difficulty-A-AH

## 2023-02-10 MED ORDER — PREDNISONE 20 MG PO TABS: 20.0000 mg | ORAL_TABLET | Freq: Every day | ORAL | 0 refills | Status: DC

## 2023-02-10 NOTE — Addendum Note (Signed)
 Addended by: Hoy Register on: 02/10/2023 01:23 PM   Modules accepted: Orders

## 2023-02-10 NOTE — Telephone Encounter (Signed)
 Prescription sent to the pharmacy for prednisone.

## 2023-02-10 NOTE — Telephone Encounter (Signed)
 Patient seen on 02/01/2023 [facility] given benzonatate  doxycycline  ondansetron. Requesting Prednisone. Please advise

## 2023-02-11 NOTE — Telephone Encounter (Signed)
 Call placed to patient unable to reach message left on VM.     Call to advise that prednisone has been sent to her pharmacy. Please let patient know this when she returns call. Thank you

## 2023-02-23 ENCOUNTER — Other Ambulatory Visit: Payer: Self-pay | Admitting: Pharmacist

## 2023-02-23 DIAGNOSIS — E1169 Type 2 diabetes mellitus with other specified complication: Secondary | ICD-10-CM

## 2023-02-23 DIAGNOSIS — Z794 Long term (current) use of insulin: Secondary | ICD-10-CM

## 2023-02-23 DIAGNOSIS — E1165 Type 2 diabetes mellitus with hyperglycemia: Secondary | ICD-10-CM

## 2023-02-23 MED ORDER — BD PEN NEEDLE NANO 2ND GEN 32G X 4 MM MISC: 3 refills | Status: DC

## 2023-02-23 MED ORDER — FREESTYLE LIBRE 3 SENSOR MISC: 3 refills | Status: DC

## 2023-02-23 MED ORDER — ALBUTEROL SULFATE HFA 108 (90 BASE) MCG/ACT IN AERS: 1.0000 | INHALATION_SPRAY | Freq: Every day | RESPIRATORY_TRACT | 0 refills | Status: DC | PRN

## 2023-02-23 MED ORDER — INSULIN LISPRO (1 UNIT DIAL) 100 UNIT/ML (KWIKPEN): 14.0000 [IU] | PEN_INJECTOR | Freq: Three times a day (TID) | SUBCUTANEOUS | 0 refills | Status: DC

## 2023-02-23 MED ORDER — LANTUS SOLOSTAR 100 UNIT/ML ~~LOC~~ SOPN: 42.0000 [IU] | PEN_INJECTOR | Freq: Every day | SUBCUTANEOUS | 1 refills | Status: DC

## 2023-02-23 NOTE — Progress Notes (Signed)
 Pharmacy TNM Diabetes Measure Review  S:  Patient was identified in a report as being at risk for failing the True [place] Metric of A1c control (<8%) in [place] and African American patients. Last A1c was 10.2. Last PCP visit was 04/15/2022.  Call placed to patient to discuss diabetes control and medication management. Patient has been seen by the clinical pharmacist with most recent visit 12/06/2022. She is in good spirits. Requests refills for pen needles, sensors, and her albuterol  inhaler today.  Current diabetes medications include: Humalog  14 units, 14 units, 10 units after BF/L/Dinner; Lantus  38 units in the morning Patient reports adherence to taking all medications as prescribed.   Insurance coverage: [employer]  Patient denies hypoglycemic events.   O:  Libre3 CGM Download today for a 14-day report.  % Time CGM is active: 99% Average Glucose: 246 mg/dL Glucose Management Indicator: 9.2  Glucose Variability: 32.2% (goal <36%) Time in Goal:  - Time in range 70-180: 26% - Time above range: 74% - Time below range: 0%   Lab Results  Component Value Date   HGBA1C 10.2 (A) 12/06/2022   There were no vitals filed for this visit.  Lipid Panel     Component Value Date/Time   CHOL 177 07/24/2021 1501   TRIG 259 (H) 07/24/2021 1501   HDL 46 07/24/2021 1501   CHOLHDL 3.8 07/24/2021 1501   LDLCALC 88 07/24/2021 1501    Clinical Atherosclerotic Cardiovascular Disease (ASCVD): No  The 10-year ASCVD risk score (Arnett DK, et al., 2019) is: 24%   Values used to calculate the score:     Age: 53 years     Sex: Female     Is Non-Hispanic African American: Yes     Diabetic: Yes     Tobacco smoker: No     Systolic Blood Pressure: 160 mmHg     Is BP treated: Yes     HDL Cholesterol: 46 mg/dL     Total Cholesterol: 177 mg/dL   Patient is participating in a Managed Medicaid Plan: No   A/P: Diabetes longstanding currently uncontrolled. Libreview shows continued  hyperglycemia at home. Patient is able to verbalize appropriate hypoglycemia management plan but is not currently hypoglycemic. Medication adherence appears to be okay, however, we discussed the need to take bolus insulin  10-15 minutes BEFORE meals for better control. She is amenable to doing this. Additionally, we will increase her dosing today. -Increased dose of Lantus  to 42 units daily in the morning.  -Increased Humalog  to 14u TID BEFORE meals.  -Patient educated on purpose, proper use, and potential adverse effects of insulin .  -Extensively discussed pathophysiology of diabetes, recommended lifestyle interventions, dietary effects on blood sugar control.  -Counseled on s/sx of and management of hypoglycemia.  -Next A1c anticipated at PCP follow-up next month.   Follow-up:  Pharmacist prn PCP clinic visit in Feb   Marene Shape, PharmD, [place], CPP Clinical Pharmacist [facility] [phone]

## 2023-02-25 ENCOUNTER — Other Ambulatory Visit: Payer: Self-pay | Admitting: Internal Medicine

## 2023-02-25 DIAGNOSIS — G894 Chronic pain syndrome: Secondary | ICD-10-CM | POA: Diagnosis not present

## 2023-02-25 DIAGNOSIS — M545 Low back pain, unspecified: Secondary | ICD-10-CM | POA: Diagnosis not present

## 2023-02-25 DIAGNOSIS — Z79891 Long term (current) use of opiate analgesic: Secondary | ICD-10-CM | POA: Diagnosis not present

## 2023-02-25 DIAGNOSIS — M25562 Pain in left knee: Secondary | ICD-10-CM | POA: Diagnosis not present

## 2023-02-25 DIAGNOSIS — M25561 Pain in right knee: Secondary | ICD-10-CM | POA: Diagnosis not present

## 2023-02-25 NOTE — Telephone Encounter (Signed)
Medication Refill -  Most Recent Primary Care Visit:  Provider: Drucilla Chalet  Department: [facility]  Visit Type: OFFICE VISIT  Date: 12/06/2022  Medication: Albuterol Proair  Has the patient contacted their pharmacy? Yes (Agent: If no, request that the patient contact the pharmacy for the refill. If patient does not wish to contact the pharmacy document the reason why and proceed with request.) (Agent: If yes, when and what did the pharmacy advise?)  Is this the correct pharmacy for this prescription? Yes If no, delete pharmacy and type the correct one.  This is the patient's preferred pharmacy:  [id number] [place] [place] - [address] [facility] [address] [place] [address] Phone: [phone] Fax: [phone]  [facility] [address] [place] - [address] [place] [address] Phone: [phone] Fax: [phone]   Has the prescription been filled recently? Yes  Is the patient out of the medication? Yes  Has the patient been seen for an appointment in the last year OR does the patient have an upcoming appointment? Yes  Can we respond through MyChart? Yes  Agent: Please be advised that Rx refills may take up to 3 business days. We ask that you follow-up with your pharmacy.

## 2023-02-25 NOTE — Telephone Encounter (Signed)
Duplicate request, Rx ordered 02/23/23. Requested Prescriptions  Pending Prescriptions Disp Refills   albuterol (VENTOLIN HFA) 108 (90 Base) MCG/ACT inhaler 25.5 each 0    Sig: Inhale 1-2 puffs into the lungs daily as needed for wheezing or shortness of breath.     Pulmonology:  Beta Agonists 2 Failed - 02/25/2023  3:25 PM      Failed - Last BP in normal range    BP Readings from Last 1 Encounters:  09/04/22 (!) 144/78         Passed - Last Heart Rate in normal range    Pulse Readings from Last 1 Encounters:  09/04/22 82         Passed - Valid encounter within last 12 months    Recent Outpatient Visits           2 months ago Type 2 diabetes mellitus with obesity (HCC)   [place] [facility] - A Dept Of [place]. [facility] Lois Huxley, [place] L, RPH-CPP   5 months ago Moderate nausea and vomiting   [place] [facility] [place] - A Dept Of [place]. [facility] Lois Huxley, [place] L, RPH-CPP   8 months ago Type 2 diabetes mellitus with obesity [facility])   [place] [facility] - A Dept Of [place]. [facility] Lois Huxley, [place] L, RPH-CPP   9 months ago Type 2 diabetes mellitus with obesity [facility])   [place] [facility] - A Dept Of [place]. [facility] Lois Huxley, [place] L, RPH-CPP   10 months ago Type 2 diabetes mellitus with hyperglycemia, with long-term current use of insulin (HCC)   [place] [facility] - A Dept Of [place]. [facility] Marcine Matar, MD       Future Appointments             In 3 weeks Marcine Matar, MD [facility] [place] - A Dept Of Eligha Bridegroom. [facility]

## 2023-03-01 ENCOUNTER — Ambulatory Visit: Payer: Self-pay

## 2023-03-01 NOTE — Telephone Encounter (Signed)
Message from Potter S sent at 03/01/2023  2:27 PM EST  Summary: albuterol (VENTOLIN HFA) 108 (90 Base) MCG/ACT inhaler? Doesn't work   The patient called in stating she would like to speak with her provider about the medication prescribed, albuterol (VENTOLIN HFA) 108 (90 Base) MCG/ACT inhaler. She says this does not work for her. She says she needs the red one the Albuterol Sulvate HFA 200 M inhalation Aerosol. Please assist patient further        Rennis Golden is what pt wants- if she wants that she will have to pay 43.00 dollar copay.  Chief Complaint: Pt stated she only wants the Brand name Pro air prescribed to her. Pt staetd she was given Ventolin on 02/23/23 and "it doesn't work." Pt stated she used ta aero chamber too and no effect. Pt stated she has always taken Proair.  Symptoms: wheezing Disposition: [] ED /[] Urgent Care (no appt availability in office) / [] Appointment(In office/virtual)/ []  [place] [facility]/ [] Home Care/ [] Refused Recommended Disposition /[] [place] [facility]/ []  Follow-up with PCP Additional Notes: [facility] who was advised of the issue. Was advised pt would have to pay $43 dollar out of pocket. Pt stated she "has nothing for her asthma then." Pt was curt during the call but seemed calmer by the end of the call since there was a plan in place.  Sent private message to [employer] and he stated he will call pharmacy for a PA for the [employer].   Reason for Disposition  [1] Prescription refill request for ESSENTIAL medicine (i.e., likelihood of harm to patient if not taken) AND [2] triager unable to refill per department policy  Answer Assessment - Initial Assessment Questions 1. DRUG NAME: "What medicine do you need to have refilled?"     Proair- pt stated Ventolin does not work. 2. REFILLS REMAINING: "How many refills are remaining?" (Note: The label on the medicine or pill bottle will show how many refills are remaining. If there are no refills  remaining, then a renewal may be needed.)     Last RF was 02/23/23 and verified with pharmacy that pt was given Ventolin 4. PRESCRIBING HCP: "Who prescribed it?" Reason: If prescribed by specialist, call should be referred to that group.     Pharmacist 5. SYMPTOMS: "Do you have any symptoms?"     Wheezing h/o asthma and having cold sx.  Protocols used: Medication Refill and Renewal Call-A-AH

## 2023-03-04 ENCOUNTER — Other Ambulatory Visit: Payer: Self-pay | Admitting: Internal Medicine

## 2023-03-04 NOTE — Telephone Encounter (Signed)
Patient calling in with call center stating she needs a different inhaler prescription instead of albuterol because it isn't working for her.

## 2023-03-04 NOTE — Telephone Encounter (Signed)
Requested by interface surescripts. Needs to keep future visit in 2 weeks.  Requested Prescriptions  Refused Prescriptions Disp Refills   losartan (COZAAR) 100 MG tablet [Pharmacy Med Name: LOSARTAN POTASSIUM 100 MG TAB] 90 tablet 1    Sig: TAKE 1 TABLET BY MOUTH EVERY DAY     Cardiovascular:  Angiotensin Receptor Blockers Failed - 03/04/2023  2:32 PM      Failed - Cr in normal range and within 180 days    Creatinine, Ser  Date Value Ref Range Status  09/03/2022 0.87 0.44 - 1.00 mg/dL Final         Failed - K in normal range and within 180 days    Potassium  Date Value Ref Range Status  09/03/2022 4.3 3.5 - 5.1 mmol/L Final         Failed - Last BP in normal range    BP Readings from Last 1 Encounters:  09/04/22 (!) 144/78         Passed - Patient is not pregnant      Passed - Valid encounter within last 6 months    Recent Outpatient Visits           2 months ago Type 2 diabetes mellitus with obesity (HCC)   [place] [facility] - A Dept Of [place]. [facility] Lois Huxley, [place] L, RPH-CPP   6 months ago Moderate nausea and vomiting   [place] [facility] [place] - A Dept Of [place]. [facility] Lois Huxley, [place] L, RPH-CPP   8 months ago Type 2 diabetes mellitus with obesity [facility])   [place] [facility] - A Dept Of [place]. [facility] Lois Huxley, [place] L, RPH-CPP   9 months ago Type 2 diabetes mellitus with obesity [facility])   [place] [facility] - A Dept Of [place]. [facility] Lois Huxley, [place] L, RPH-CPP   10 months ago Type 2 diabetes mellitus with hyperglycemia, with long-term current use of insulin (HCC)    [facility] - A Dept Of [place]. [facility] Marcine Matar, MD       Future Appointments             In 2 weeks Marcine Matar, MD [facility] [place] - A Dept Of Eligha Bridegroom. [facility]              atorvastatin (LIPITOR) 10 MG tablet [Pharmacy Med Name: ATORVASTATIN 10 MG TABLET] 90 tablet 1    Sig: TAKE 1 TABLET BY MOUTH EVERY DAY     Cardiovascular:  Antilipid - Statins Failed - 03/04/2023  2:32 PM      Failed - Lipid Panel in normal range within the last 12 months    Cholesterol, Total  Date Value Ref Range Status  07/24/2021 177 100 - 199 mg/dL Final   LDL Chol Calc (NIH)  Date Value Ref Range Status  07/24/2021 88 0 - 99 mg/dL Final   HDL  Date Value Ref Range Status  07/24/2021 46 >39 mg/dL Final   Triglycerides  Date Value Ref Range Status  07/24/2021 259 (H) 0 - 149 mg/dL Final         Passed - Patient is not pregnant      Passed - Valid encounter within last 12 months    Recent Outpatient Visits           2 months ago  Type 2 diabetes mellitus with obesity (HCC)   [place] [facility] - A Dept Of [place]. [facility] Lois Huxley, [place] L, RPH-CPP   6 months ago Moderate nausea and vomiting   [place] [facility] [place] - A Dept Of [place]. [facility] Lois Huxley, [place] L, RPH-CPP   8 months ago Type 2 diabetes mellitus with obesity [facility])   [place] [facility] - A Dept Of [place]. [facility] Lois Huxley, [place] L, RPH-CPP   9 months ago Type 2 diabetes mellitus with obesity [facility])   [place] [facility] - A Dept Of [place]. [facility] Lois Huxley, [place] L, RPH-CPP   10 months ago Type 2 diabetes mellitus with hyperglycemia, with long-term current use of insulin (HCC)   [place] [facility] - A Dept Of [place]. [facility] Marcine Matar, MD       Future Appointments             In 2 weeks Marcine Matar, MD [facility] [place] - A Dept Of Eligha Bridegroom. [facility]

## 2023-03-08 ENCOUNTER — Other Ambulatory Visit: Payer: Self-pay | Admitting: Internal Medicine

## 2023-03-08 NOTE — Telephone Encounter (Signed)
Requested medication (s) are due for refill today: yes  Requested medication (s) are on the active medication list: yes  Last refill:  02/07/23 #30 0 refills  Future visit scheduled: yes in 1 week  Notes to clinic:  do you want to give another courtesy refill or give enough medications until appt in 1 week?     Requested Prescriptions  Pending Prescriptions Disp Refills   atorvastatin (LIPITOR) 10 MG tablet [Pharmacy Med Name: ATORVASTATIN 10 MG TABLET] 30 tablet 0    Sig: TAKE 1 TABLET BY MOUTH EVERY DAY     Cardiovascular:  Antilipid - Statins Failed - 03/08/2023  1:37 PM      Failed - Lipid Panel in normal range within the last 12 months    Cholesterol, Total  Date Value Ref Range Status  07/24/2021 177 100 - 199 mg/dL Final   LDL Chol Calc (NIH)  Date Value Ref Range Status  07/24/2021 88 0 - 99 mg/dL Final   HDL  Date Value Ref Range Status  07/24/2021 46 >39 mg/dL Final   Triglycerides  Date Value Ref Range Status  07/24/2021 259 (H) 0 - 149 mg/dL Final         Passed - Patient is not pregnant      Passed - Valid encounter within last 12 months    Recent Outpatient Visits           3 months ago Type 2 diabetes mellitus with obesity (HCC)   [place] [facility] - A Dept Of [place]. [facility] Lois Huxley, [place] L, RPH-CPP   6 months ago Moderate nausea and vomiting   [place] [facility] [place] - A Dept Of [place]. [facility] Lois Huxley, [place] L, RPH-CPP   8 months ago Type 2 diabetes mellitus with obesity [facility])   [place] [facility] - A Dept Of [place]. [facility] Lois Huxley, [place] L, RPH-CPP   9 months ago Type 2 diabetes mellitus with obesity [facility])   [place] [facility] - A Dept Of [place]. [facility] Lois Huxley, [place] L, RPH-CPP   10 months ago Type 2 diabetes mellitus with hyperglycemia, with long-term current use of insulin (HCC)    Comm Health  [place] [facility] - A Dept Of [place]. [facility] Marcine Matar, MD       Future Appointments             In 1 week Marcine Matar, MD [facility] [place] - A Dept Of Eligha Bridegroom. [facility]             losartan (COZAAR) 100 MG tablet [Pharmacy Med Name: LOSARTAN POTASSIUM 100 MG TAB] 30 tablet 0    Sig: TAKE 1 TABLET BY MOUTH EVERY DAY     Cardiovascular:  Angiotensin Receptor Blockers Failed - 03/08/2023  1:37 PM      Failed - Cr in normal range and within 180 days    Creatinine, Ser  Date Value Ref Range Status  09/03/2022 0.87 0.44 - 1.00 mg/dL Final         Failed - K in normal range and within 180 days    Potassium  Date Value Ref Range Status  09/03/2022 4.3 3.5 - 5.1 mmol/L Final         Failed - Last BP in normal range    BP Readings from Last 1 Encounters:  09/04/22 (!) 144/78         Passed - Patient is not pregnant      Passed - Valid encounter within last 6 months    Recent Outpatient Visits           3 months ago Type 2 diabetes mellitus with obesity (HCC)   [place] [facility] - A Dept Of [place]. [facility] Lois Huxley, [place] L, RPH-CPP   6 months ago Moderate nausea and vomiting   [place] [facility] [place] - A Dept Of [place]. [facility] Lois Huxley, [place] L, RPH-CPP   8 months ago Type 2 diabetes mellitus with obesity [facility])   [place] [facility] - A Dept Of [place]. [facility] Lois Huxley, [place] L, RPH-CPP   9 months ago Type 2 diabetes mellitus with obesity [facility])   [place] [facility] - A Dept Of [place]. [facility] Lois Huxley, [place] L, RPH-CPP   10 months ago Type 2 diabetes mellitus with hyperglycemia, with long-term current use of insulin (HCC)   [place] [facility] - A Dept Of [place]. [facility] Marcine Matar, MD       Future Appointments             In 1 week Marcine Matar,  MD [facility] [place] - A Dept Of Eligha Bridegroom. [facility]

## 2023-03-09 NOTE — Telephone Encounter (Signed)
Copied from CRM [id number]. Topic: Clinical - Prescription Issue >> Mar 04, 2023  3:27 PM Laurie Andrews wrote: Reason for CRM: Patient received the wrong inhaler, she was supposed to have the [employer] inhaler called in. It is not showing up in her med list but she says she had it called in three months ago.

## 2023-03-09 NOTE — Telephone Encounter (Signed)
Pt states she just got a call from the nurse.  She is going to try to take a pic of the inhaler and send to the nurse .

## 2023-03-14 DIAGNOSIS — R079 Chest pain, unspecified: Secondary | ICD-10-CM | POA: Diagnosis not present

## 2023-03-14 DIAGNOSIS — J45901 Unspecified asthma with (acute) exacerbation: Secondary | ICD-10-CM | POA: Diagnosis not present

## 2023-03-14 DIAGNOSIS — I10 Essential (primary) hypertension: Secondary | ICD-10-CM | POA: Diagnosis not present

## 2023-03-14 DIAGNOSIS — G43909 Migraine, unspecified, not intractable, without status migrainosus: Secondary | ICD-10-CM | POA: Diagnosis not present

## 2023-03-14 DIAGNOSIS — E1142 Type 2 diabetes mellitus with diabetic polyneuropathy: Secondary | ICD-10-CM | POA: Diagnosis not present

## 2023-03-14 DIAGNOSIS — Z9049 Acquired absence of other specified parts of digestive tract: Secondary | ICD-10-CM | POA: Diagnosis not present

## 2023-03-14 DIAGNOSIS — Z794 Long term (current) use of insulin: Secondary | ICD-10-CM | POA: Diagnosis not present

## 2023-03-14 DIAGNOSIS — G8929 Other chronic pain: Secondary | ICD-10-CM | POA: Diagnosis not present

## 2023-03-14 DIAGNOSIS — Z1152 Encounter for screening for COVID-19: Secondary | ICD-10-CM | POA: Diagnosis not present

## 2023-03-14 DIAGNOSIS — J45909 Unspecified asthma, uncomplicated: Secondary | ICD-10-CM | POA: Diagnosis not present

## 2023-03-14 DIAGNOSIS — J4541 Moderate persistent asthma with (acute) exacerbation: Secondary | ICD-10-CM | POA: Diagnosis not present

## 2023-03-14 DIAGNOSIS — G580 Intercostal neuropathy: Secondary | ICD-10-CM | POA: Diagnosis not present

## 2023-03-14 DIAGNOSIS — J309 Allergic rhinitis, unspecified: Secondary | ICD-10-CM | POA: Diagnosis not present

## 2023-03-14 DIAGNOSIS — Z79891 Long term (current) use of opiate analgesic: Secondary | ICD-10-CM | POA: Diagnosis not present

## 2023-03-14 DIAGNOSIS — G4733 Obstructive sleep apnea (adult) (pediatric): Secondary | ICD-10-CM | POA: Diagnosis not present

## 2023-03-14 DIAGNOSIS — R0602 Shortness of breath: Secondary | ICD-10-CM | POA: Diagnosis not present

## 2023-03-14 DIAGNOSIS — Z79899 Other long term (current) drug therapy: Secondary | ICD-10-CM | POA: Diagnosis not present

## 2023-03-14 DIAGNOSIS — K219 Gastro-esophageal reflux disease without esophagitis: Secondary | ICD-10-CM | POA: Diagnosis not present

## 2023-03-17 ENCOUNTER — Other Ambulatory Visit: Payer: Self-pay | Admitting: Internal Medicine

## 2023-03-17 NOTE — Telephone Encounter (Unsigned)
 Copied from CRM [id number]. Topic: Clinical - Medication Refill >> Mar 17, 2023 12:45 PM Laurier C wrote: Most Recent Primary Care Visit:  Provider: VAN AUSDALL, STEPHEN L  Department: [facility]  Visit Type: OFFICE VISIT  Date: 12/06/2022  Medication:  albuterol  (PROVENTIL ) (2.5 MG/3ML) 0.083% nebulizer solution  Has the patient contacted their pharmacy? Yes (Agent: If no, request that the patient contact the pharmacy for the refill. If patient does not wish to contact the pharmacy document the reason why and proceed with request.) (Agent: If yes, when and what did the pharmacy advise?)  Is this the correct pharmacy for this prescription? Yes If no, delete pharmacy and type the correct one.  This is the patient's preferred pharmacy:   [phone] [place] [place] [facility] [address] [place] [address] Phone: [phone] Fax: [phone]   Has the prescription been filled recently? No  Is the patient out of the medication? Yes  Has the patient been seen for an appointment in the last year OR does the patient have an upcoming appointment? No  Can we respond through MyChart? Yes  Agent: Please be advised that Rx refills may take up to 3 business days. We ask that you follow-up with your pharmacy.

## 2023-03-18 ENCOUNTER — Ambulatory Visit: Payer: 59 | Admitting: Internal Medicine

## 2023-03-19 ENCOUNTER — Other Ambulatory Visit: Payer: Self-pay | Admitting: Internal Medicine

## 2023-03-19 DIAGNOSIS — E1169 Type 2 diabetes mellitus with other specified complication: Secondary | ICD-10-CM

## 2023-03-21 NOTE — Telephone Encounter (Signed)
 Requested Prescriptions  Pending Prescriptions Disp Refills   Accu-Chek Softclix Lancets lancets [Pharmacy Med Name: ACCU-CHEK SOFTCLIX LANCETS] 300 each 2    Sig: USE AS INSTRUCTED     Endocrinology: Diabetes - Testing Supplies Passed - 03/21/2023 10:33 AM      Passed - Valid encounter within last 12 months    Recent Outpatient Visits           3 months ago Type 2 diabetes mellitus with obesity (HCC)   [place] [facility] - A Dept Of [place]. [facility] Freada Jacobs, [place] L, RPH-CPP   6 months ago Moderate nausea and vomiting   [place] [facility] [place] - A Dept Of [place]. [facility] Freada Jacobs, [place] L, RPH-CPP   9 months ago Type 2 diabetes mellitus with obesity [facility])   [place] [facility] - A Dept Of [place]. [facility] Freada Jacobs, [place] L, RPH-CPP   10 months ago Type 2 diabetes mellitus with obesity [facility])   [place] [facility] Grout - A Dept Of [place]. [facility] Freada Jacobs, [place] L, RPH-CPP   11 months ago Type 2 diabetes mellitus with hyperglycemia, with long-term current use of insulin  [facility])    Comm Health [place] - A Dept Of [place]. [facility] Lawrance Presume, MD       Future Appointments             In 1 month Lincoln Renshaw Rexine Cater, MD [facility] Health Comm [facility] [place] - A Dept Of Tommas Fragmin. [facility]

## 2023-03-23 ENCOUNTER — Other Ambulatory Visit: Payer: Self-pay | Admitting: Internal Medicine

## 2023-03-28 ENCOUNTER — Encounter (HOSPITAL_COMMUNITY): Payer: Self-pay

## 2023-03-28 ENCOUNTER — Other Ambulatory Visit: Payer: Self-pay

## 2023-03-28 ENCOUNTER — Ambulatory Visit (HOSPITAL_COMMUNITY)
Admission: RE | Admit: 2023-03-28 | Discharge: 2023-03-28 | Disposition: A | Discharge status: Home / Self Care | Payer: 59 | Source: Ambulatory Visit | Attending: Physician Assistant | Admitting: Physician Assistant

## 2023-03-28 ENCOUNTER — Ambulatory Visit: Payer: Self-pay | Admitting: Internal Medicine

## 2023-03-28 VITALS — BP 169/77 | HR 76 | Temp 98.4°F | Resp 24

## 2023-03-28 DIAGNOSIS — J4541 Moderate persistent asthma with (acute) exacerbation: Secondary | ICD-10-CM | POA: Diagnosis not present

## 2023-03-28 MED ORDER — AZITHROMYCIN 250 MG PO TABS: ORAL_TABLET | ORAL | 0 refills | Status: DC

## 2023-03-28 MED ORDER — ALBUTEROL SULFATE HFA 108 (90 BASE) MCG/ACT IN AERS: 2.0000 | INHALATION_SPRAY | Freq: Four times a day (QID) | RESPIRATORY_TRACT | 0 refills | Status: DC | PRN

## 2023-03-28 MED ORDER — PREDNISONE 20 MG PO TABS: ORAL_TABLET | ORAL | 0 refills | Status: DC

## 2023-03-28 NOTE — ED Triage Notes (Signed)
Patient went to a funeral up [place] on 03/12/2023.  Was admitted to the hospital on 03/14/2023-was diagnosed with flu A.  Patient was discharged on 03/18/2023.  Returned to [facility] yesterday 03/27/2023.    Coughing up yellow phlegm noticed while traveling home.  When coughing , entire body hurts.    PCP sent patient to [facility].    Patient is using an inhaler.

## 2023-03-28 NOTE — Telephone Encounter (Signed)
Chief Complaint: Flu symptoms Symptoms: Cough, shortness of breath, fever, vomiting Frequency: early February Pertinent Negatives: Patient denies n/a Disposition: [] ED /[x] Urgent Care (no appt availability in office) / [] Appointment(In office/virtual)/ []  [place] [facility]/ [] Home Care/ [] Refused Recommended Disposition /[] [place] Mobile Bus/ []  Follow-up with PCP Additional Notes: Patient called in stating she was diagnosed with Flu in early February and was hospitalized for a week. This occurred out of town. Patient states she is still having symptoms such as a fever of 102, vomiting, cough with productive phlegm that is green and yellow, shortness of breath, chest tightness when coughing. Patient states she is able to stay hydrated and drinking a lot of water. Recommended patient be evaluated this morning at Urgent Care. Appt created for patient at Urgent Care on [facility] for this morning. Patient already has follow up appt scheduled with PCP.    Copied from CRM [id number]. Topic: Clinical - Red Word Triage >> Mar 28, 2023  9:52 AM Elle L wrote: Red Word that prompted transfer to Nurse Triage: The patient was was diagnosed with the flu A on 2/4 but her headache and cough has been worsening. She has discolored mucus and is concerned it is an infection. Reason for Disposition  [1] Fever > 100.0 F (37.8 C) AND [2] diabetes mellitus or weak immune system (e.g., HIV positive, cancer chemo, splenectomy, organ transplant, chronic steroids)  Answer Assessment - Initial Assessment Questions 1. WORST SYMPTOM: "What is your worst symptom?" (e.g., cough, runny nose, muscle aches, headache, sore throat, fever)      Cough -  2. ONSET: "When did your flu symptoms start?"      Early february 3. COUGH: "How bad is the cough?"       Cough attacks consistently 4. RESPIRATORY DISTRESS: "Describe your breathing."      I feel short of breath and chest is getting tight 5. FEVER: "Do you have a  fever?" If Yes, ask: "What is your temperature, how was it measured, and when did it start?"     Laste night 102 6. EXPOSURE: "Were you exposed to someone with influenza?"       Diagnosed with Flu A early February 7. FLU VACCINE: "Did you get a flu shot this year?"     YEs 8. HIGH RISK DISEASE: "Do you have any chronic medical problems?" (e.g., heart or lung disease, asthma, weak immune system, or other HIGH RISK conditions)     No  10. OTHER SYMPTOMS: "Do you have any other symptoms?"  (e.g., runny nose, muscle aches, headache, sore throat)       Body aches, fever, cough, vomiting, chest soreness, productive phlegm  Protocols used: Influenza (Flu) - [facility]

## 2023-03-28 NOTE — Telephone Encounter (Signed)
 noted

## 2023-03-28 NOTE — Discharge Instructions (Addendum)
At this time I suspect that her asthma has been exacerbated due to recent influenza. I have sent in a medication called azithromycin or Z-Pak.  This is an antibiotic that also helps with pulmonary inflammation.  I have sent in a refill of your albuterol inhaler and a prednisone taper. I have sent in a script for Prednisone taper to be taken in the morning with breakfast per the instructions on the container Remember that steroids can cause sleeplessness, irritability, increased hunger and elevated glucose levels so be mindful of these side effects. They should lessen as you progress to the lower doses of the taper. Please continue to use your nebulizer and your Breo inhaler as directed.  If you start to notice increased trouble breathing, wheezing, coughing that is not responding or improving with your medications please go to the emergency room for further evaluation and management.

## 2023-03-28 NOTE — ED Provider Notes (Signed)
[facility]    CSN: [id number] Arrival date & time: 03/28/23  1107      History   Chief Complaint Chief Complaint  Patient presents with   Appointment    11:00    HPI Laurie Andrews is a 53 y.o. female.   HPI  Patient states she was out of town at the beginning of the month and was hospitalized for Flu A  She was admitted on 2/3 - 2/7 for management at [facility]   She reports she was still feeling SOB and having coughing when she was discharged. She was sent home with prednisone and tamiflu She was given Breo inhaler as well   She states she is still having SOB, fever (she states when she gets feverish she starts to have lip swelling and blisters) She is also having coughing and feels like her glucose is fluctuating a lot   She states she is using her rescue inhaler 4-5 times per day. She has been taking Breo as directed. She has not been using her Wixela since using Breo      Past Medical History:  Diagnosis Date   Anxiety    Asthma    Diabetes mellitus without complication (HCC)    Hypertension     Patient Active Problem List   Diagnosis Date Noted   Hyperlipidemia 07/25/2021   Influenza vaccine needed 05/08/2020   Obesity (BMI 30.0-34.9) 05/08/2020   Moderate persistent asthma without complication 05/08/2020   Post-traumatic osteoarthritis of both knees 05/08/2020   Acute pyelonephritis 04/09/2020   Constipation 04/09/2020   Type 2 diabetes mellitus with hyperglycemia (HCC) 04/08/2020   Essential hypertension 04/08/2020    Past Surgical History:  Procedure Laterality Date   BREAST SURGERY     CHOLECYSTECTOMY     HIGH RISK BREAST EXCISION     KNEE SURGERY      OB History   No obstetric history on file.      Home Medications    Prior to Admission medications   Medication Sig Start Date End Date Taking? Authorizing Provider  albuterol (PROAIR HFA) 108 (90 Base) MCG/ACT inhaler Inhale 2 puffs into the lungs every 6 (six)  hours as needed for wheezing or shortness of breath. 03/28/23  Yes Sarahy Creedon E, PA-C  azithromycin (ZITHROMAX) 250 MG tablet Take 500mg  PO daily x1d and then 250mg  daily x4 days 03/28/23  Yes Shaquetta Arcos E, PA-C  predniSONE (DELTASONE) 20 MG tablet Take 60mg  PO daily x 2 days, then40mg  PO daily x 2 days, then 20mg  PO daily x 3 days 03/28/23  Yes Birdella Sippel E, PA-C  Accu-Chek Softclix Lancets lancets USE AS INSTRUCTED 03/21/23   Marcine Matar, MD  albuterol (PROVENTIL) (2.5 MG/3ML) 0.083% nebulizer solution INHALE 3 ML BY NEBULIZATION EVERY 6 HOURS AS NEEDED FOR WHEEZING OR SHORTNESS OF BREATH 02/08/23   Marcine Matar, MD  albuterol (VENTOLIN HFA) 108 (90 Base) MCG/ACT inhaler INHALE 1-2 PUFFS INTO THE LUNGS DAILY AS NEEDED FOR WHEEZING OR SHORTNESS OF BREATH. 03/23/23   Marcine Matar, MD  amitriptyline (ELAVIL) 100 MG tablet Take 100 mg by mouth as needed for sleep. 12/29/18   [provider]  amLODipine (NORVASC) 10 MG tablet TAKE 1 TABLET BY MOUTH EVERY DAY 01/03/23   Marcine Matar, MD  atorvastatin (LIPITOR) 10 MG tablet TAKE 1 TABLET BY MOUTH EVERY DAY 03/08/23   Marcine Matar, MD  benzonatate (TESSALON) 100 MG capsule Take 1 capsule (100 mg total) by  mouth 3 (three) times daily as needed. Patient not taking: Reported on 03/28/2023 06/17/22   Marcine Matar, MD  benzonatate (TESSALON) 200 MG capsule Take 1 capsule (200 mg total) by mouth 2 (two) times daily as needed for cough. Patient not taking: Reported on 03/28/2023 02/01/23   Delorse Lek, FNP  cetirizine (ZYRTEC) 10 MG tablet Take 10 mg by mouth daily. 06/16/11   [provider]  Cholecalciferol (VITAMIN D3) 25 MCG (1000 UT) capsule Take 1 capsule (1,000 Units total) by mouth daily. 04/21/22   Marcine Matar, MD  Continuous Blood Gluc Receiver (FREESTYLE LIBRE 3 READER) DEVI 1 application  by Does not apply route in the morning, at noon, and at bedtime. Use to check blood sugar TID. E11.65 04/13/22    Marcine Matar, MD  Continuous Glucose Sensor (FREESTYLE LIBRE 3 SENSOR) MISC USE TO CHECK BLOOD SUGAR THREE TIMES A DAY 02/23/23   Marcine Matar, MD  diclofenac Sodium (VOLTAREN) 1 % GEL Apply 2 g topically 4 (four) times daily. 11/17/21   Jeannie Fend, PA-C  doxycycline (VIBRA-TABS) 100 MG tablet Take 1 tablet (100 mg total) by mouth 2 (two) times daily. Patient not taking: Reported on 03/28/2023 05/21/22   Margaretann Loveless, PA-C  GAVILAX 17 GM/SCOOP powder Take 17 g by mouth daily as needed for mild constipation. 09/04/19   [provider]  glucose blood (ACCU-CHEK GUIDE) test strip Use as instructed 03/15/22   Marcine Matar, MD  insulin glargine (LANTUS SOLOSTAR) 100 UNIT/ML Solostar Pen Inject 42 Units into the skin daily. 02/23/23   Marcine Matar, MD  insulin lispro (HUMALOG) 100 UNIT/ML KwikPen Inject 14 Units into the skin 3 (three) times daily. Inject 15 units three times with meals. 02/23/23   Marcine Matar, MD  Insulin Pen Needle (BD PEN NEEDLE NANO 2ND GEN) 32G X 4 MM MISC Use to inject insulin. 02/23/23   Marcine Matar, MD  losartan (COZAAR) 100 MG tablet TAKE 1 TABLET BY MOUTH EVERY DAY 03/08/23   Marcine Matar, MD  montelukast (SINGULAIR) 10 MG tablet TAKE 1 TABLET BY MOUTH EVERY DAY 01/03/23   Marcine Matar, MD  naloxone [facility]) nasal spray 4 mg/0.1 mL USE AS DIRECTED 03/15/22   Marcine Matar, MD  naratriptan (AMERGE) 2.5 MG tablet TAKE 1 TABLET BY MOUTH AS NEEDED FOR MIGRAINE. 10/12/22   Marcine Matar, MD  nystatin cream (MYCOSTATIN) Apply 1 application topically 3 (three) times daily as needed for dry skin. 01/05/20   [provider]  ondansetron (ZOFRAN) 4 MG tablet Take 1 tablet (4 mg total) by mouth every 6 (six) hours. Patient not taking: Reported on 03/28/2023 09/03/22   Al Decant, PA-C  ondansetron (ZOFRAN-ODT) 4 MG disintegrating tablet Take 1 tablet (4 mg total) by mouth every 8 (eight) hours as needed  for nausea or vomiting. Patient not taking: Reported on 03/28/2023 02/01/23   Delorse Lek, FNP  oxyCODONE (OXY IR/ROXICODONE) 5 MG immediate release tablet Take 5 mg by mouth in the morning, at noon, and at bedtime.    [provider]  pantoprazole (PROTONIX) 40 MG tablet TAKE 1 TABLET BY MOUTH TWICE A DAY 01/03/23   Marcine Matar, MD  predniSONE (DELTASONE) 20 MG tablet Take 1 tablet (20 mg total) by mouth daily with breakfast. 02/10/23   Hoy Register, MD  promethazine-dextromethorphan (PROMETHAZINE-DM) 6.25-15 MG/5ML syrup Take 5 mLs by mouth 4 (four) times daily as needed. Patient not  taking: Reported on 03/28/2023 05/21/22   Margaretann Loveless, PA-C  senna-docusate (SENOKOT-S) 8.6-50 MG tablet Take 1 tablet by mouth daily as needed for mild constipation. 04/15/22   Marcine Matar, MD  topiramate (TOPAMAX) 25 MG tablet Take 50 mg by mouth 2 (two) times daily. Patient not taking: Reported on 03/28/2023 03/20/19   [provider]  triamcinolone cream (KENALOG) 0.1 % APPLY TO AFFECTED AREA TWICE A DAY 04/12/22   Marcine Matar, MD  Monte Fantasia INHUB 100-50 MCG/ACT AEPB INHALE 1 PUFF INTO THE LUNGS TWICE A DAY 01/28/23   Marcine Matar, MD    Family History Family History  Problem Relation Age of Onset   Breast cancer Maternal Aunt     Social History Social History   Tobacco Use   Smoking status: Never   Smokeless tobacco: Never  Vaping Use   Vaping status: Never Used  Substance Use Topics   Alcohol use: Not Currently   Drug use: Never     Allergies   Keflex [cephalexin], Metformin and related, Trulicity [dulaglutide], Aspirin, Morphine and codeine, and Penicillins   Review of Systems Review of Systems  Respiratory:  Positive for cough and shortness of breath.   Musculoskeletal:  Positive for myalgias.     Physical Exam Triage Vital Signs ED Triage Vitals  Encounter Vitals Group     BP 03/28/23 1132 (!) 169/77     Systolic BP Percentile --       Diastolic BP Percentile --      Pulse Rate 03/28/23 1132 76     Resp 03/28/23 1132 (!) 24     Temp 03/28/23 1132 98.4 F (36.9 C)     Temp Source 03/28/23 1132 Oral     SpO2 03/28/23 1132 98 %     Weight --      Height --      Head Circumference --      Peak Flow --      Pain Score 03/28/23 1129 10     Pain Loc --      Pain Education --      Exclude from Growth Chart --    No data found.  Updated Vital Signs BP (!) 169/77 (BP Location: Left Arm)   Pulse 76   Temp 98.4 F (36.9 C) (Oral)   Resp (!) 24   SpO2 98%   Visual Acuity Right Eye Distance:   Left Eye Distance:   Bilateral Distance:    Right Eye Near:   Left Eye Near:    Bilateral Near:     Physical Exam Vitals reviewed.  Constitutional:      General: She is awake.     Appearance: Normal appearance. She is well-developed and well-groomed.  HENT:     Head: Normocephalic and atraumatic.     Mouth/Throat:     Lips: Pink.     Mouth: Mucous membranes are moist.     Pharynx: Oropharynx is clear. Uvula midline.  Cardiovascular:     Rate and Rhythm: Normal rate and regular rhythm.     Heart sounds: Normal heart sounds.  Pulmonary:     Effort: Tachypnea present.     Breath sounds: Decreased air movement present. Examination of the right-lower field reveals decreased breath sounds. Examination of the left-lower field reveals decreased breath sounds. Decreased breath sounds present. No wheezing, rhonchi or rales.  Musculoskeletal:     Cervical back: Normal range of motion and neck supple.  Neurological:  General: No focal deficit present.     Mental Status: She is alert and oriented to person, place, and time.  Psychiatric:        Mood and Affect: Mood normal.        Behavior: Behavior normal. Behavior is cooperative.        Thought Content: Thought content normal.        Judgment: Judgment normal.      UC Treatments / Results  Labs (all labs ordered are listed, but only abnormal results are  displayed) Labs Reviewed - No data to display  EKG   Radiology No results found.  Procedures Procedures (including critical care time)  Medications Ordered in UC Medications - No data to display  Initial Impression / Assessment and Plan / UC Course  I have reviewed the triage vital signs and the nursing notes.  Pertinent labs & imaging results that were available during my care of the patient were reviewed by me and considered in my medical decision making (see chart for details).      Final Clinical Impressions(s) / UC Diagnoses   Final diagnoses:  Moderate persistent asthma with acute exacerbation   Chronic, acute exacerbation Patient presents today with concerns for continued coughing, shortness of breath following a hospitalization for the flu.  She reports that she was discharged with Breo inhaler, prednisone, Tamiflu.  She states that she continues to have symptoms and feels short of breath.  Physical exam is notable for decreased air movement and mildly decreased breath sounds in the lower lung fields.  No wheezes, rhonchi, Rales.  Oxygen saturation is 98% in clinic today.  At this time we will send in prednisone taper, Z-Pak to assist with pulmonary inflammation.  Reviewed that she should continue to use her nebulizers and Breo inhaler.  Refill sent for her ProAir albuterol per her request.  ED and return precautions reviewed and provided in after visit summary.  Follow-up as needed for progressing or persistent symptoms     Discharge Instructions      At this time I suspect that her asthma has been exacerbated due to recent influenza. I have sent in a medication called azithromycin or Z-Pak.  This is an antibiotic that also helps with pulmonary inflammation.  I have sent in a refill of your albuterol inhaler and a prednisone taper. I have sent in a script for Prednisone taper to be taken in the morning with breakfast per the instructions on the container Remember that  steroids can cause sleeplessness, irritability, increased hunger and elevated glucose levels so be mindful of these side effects. They should lessen as you progress to the lower doses of the taper. Please continue to use your nebulizer and your Breo inhaler as directed.  If you start to notice increased trouble breathing, wheezing, coughing that is not responding or improving with your medications please go to the emergency room for further evaluation and management.     ED Prescriptions     Medication Sig Dispense Auth. Provider   predniSONE (DELTASONE) 20 MG tablet Take 60mg  PO daily x 2 days, then40mg  PO daily x 2 days, then 20mg  PO daily x 3 days 13 tablet Surah Pelley E, PA-C   azithromycin (ZITHROMAX) 250 MG tablet Take 500mg  PO daily x1d and then 250mg  daily x4 days 6 each Jerline Linzy E, PA-C   albuterol (PROAIR HFA) 108 (90 Base) MCG/ACT inhaler Inhale 2 puffs into the lungs every 6 (six) hours as needed for wheezing or shortness  of breath. 8 g Antonella Upson E, PA-C      PDMP not reviewed this encounter.   Sonoma Firkus, Oswaldo Conroy, PA-C 03/28/23 1224

## 2023-03-29 ENCOUNTER — Ambulatory Visit: Payer: 59 | Attending: Internal Medicine

## 2023-03-29 VITALS — Ht 61.0 in | Wt 199.0 lb

## 2023-03-29 DIAGNOSIS — Z0000 Encounter for general adult medical examination without abnormal findings: Secondary | ICD-10-CM

## 2023-03-29 DIAGNOSIS — G894 Chronic pain syndrome: Secondary | ICD-10-CM | POA: Diagnosis not present

## 2023-03-29 DIAGNOSIS — Z79891 Long term (current) use of opiate analgesic: Secondary | ICD-10-CM | POA: Diagnosis not present

## 2023-03-29 DIAGNOSIS — M5412 Radiculopathy, cervical region: Secondary | ICD-10-CM | POA: Diagnosis not present

## 2023-03-29 DIAGNOSIS — M797 Fibromyalgia: Secondary | ICD-10-CM | POA: Diagnosis not present

## 2023-03-29 DIAGNOSIS — M47816 Spondylosis without myelopathy or radiculopathy, lumbar region: Secondary | ICD-10-CM | POA: Diagnosis not present

## 2023-03-29 NOTE — Progress Notes (Signed)
Subjective:   Laurie Andrews is a 53 y.o. female who presents for Medicare Annual (Subsequent) preventive examination.  Visit Complete: Virtual I connected with  Laurie Andrews on 03/29/23 by a video and audio enabled telemedicine application and verified that I am speaking with the correct person using two identifiers.  Patient Location: Home  Provider Location: Home Office  I discussed the limitations of evaluation and management by telemedicine. The patient expressed understanding and agreed to proceed.  Vital Signs: Because this visit was a virtual/telehealth visit, some criteria may be missing or patient reported. Any vitals not documented were not able to be obtained and vitals that have been documented are patient reported.  Cardiac Risk Factors include: advanced age (>15men, >68 women);diabetes mellitus;dyslipidemia;hypertension;obesity (BMI >30kg/m2);sedentary lifestyle     Objective:    Today's Vitals   03/29/23 1424  Weight: 199 lb (90.3 kg)  Height: 5\' 1"  (1.549 m)  PainSc: 0-No pain   Body mass index is 37.6 kg/m.     03/29/2023    2:26 PM 11/16/2022    2:29 PM 09/03/2022    7:33 PM 11/27/2021   11:17 AM 11/17/2021   11:32 AM 04/05/2020    1:08 PM 09/07/2019   10:02 AM  Advanced Directives  Does Patient Have a Medical Advance Directive? Yes No No No No No No  Type of Estate agent of [place];Living will        Copy of Healthcare Power of Attorney in Chart? No - copy requested        Would patient like information on creating a medical advance directive?  Yes (MAU/Ambulatory/Procedural Areas - Information given)  Yes (ED - Information included in AVS) No - Patient declined No - Patient declined No - Patient declined    Current Medications (verified) Outpatient Encounter Medications as of 03/29/2023  Medication Sig   Accu-Chek Softclix Lancets lancets USE AS INSTRUCTED   albuterol (PROAIR HFA) 108 (90 Base) MCG/ACT inhaler Inhale 2  puffs into the lungs every 6 (six) hours as needed for wheezing or shortness of breath.   albuterol (PROVENTIL) (2.5 MG/3ML) 0.083% nebulizer solution INHALE 3 ML BY NEBULIZATION EVERY 6 HOURS AS NEEDED FOR WHEEZING OR SHORTNESS OF BREATH   albuterol (VENTOLIN HFA) 108 (90 Base) MCG/ACT inhaler INHALE 1-2 PUFFS INTO THE LUNGS DAILY AS NEEDED FOR WHEEZING OR SHORTNESS OF BREATH.   amitriptyline (ELAVIL) 100 MG tablet Take 100 mg by mouth as needed for sleep.   amLODipine (NORVASC) 10 MG tablet TAKE 1 TABLET BY MOUTH EVERY DAY   atorvastatin (LIPITOR) 10 MG tablet TAKE 1 TABLET BY MOUTH EVERY DAY   azithromycin (ZITHROMAX) 250 MG tablet Take 500mg  PO daily x1d and then 250mg  daily x4 days   benzonatate (TESSALON) 100 MG capsule Take 1 capsule (100 mg total) by mouth 3 (three) times daily as needed. (Patient not taking: Reported on 03/28/2023)   benzonatate (TESSALON) 200 MG capsule Take 1 capsule (200 mg total) by mouth 2 (two) times daily as needed for cough. (Patient not taking: Reported on 03/28/2023)   cetirizine (ZYRTEC) 10 MG tablet Take 10 mg by mouth daily.   Cholecalciferol (VITAMIN D3) 25 MCG (1000 UT) capsule Take 1 capsule (1,000 Units total) by mouth daily.   Continuous Blood Gluc Receiver (FREESTYLE LIBRE 3 READER) DEVI 1 application  by Does not apply route in the morning, at noon, and at bedtime. Use to check blood sugar TID. E11.65   Continuous Glucose Sensor (FREESTYLE LIBRE 3 SENSOR) MISC  USE TO CHECK BLOOD SUGAR THREE TIMES A DAY   diclofenac Sodium (VOLTAREN) 1 % GEL Apply 2 g topically 4 (four) times daily.   doxycycline (VIBRA-TABS) 100 MG tablet Take 1 tablet (100 mg total) by mouth 2 (two) times daily. (Patient not taking: Reported on 03/28/2023)   GAVILAX 17 GM/SCOOP powder Take 17 g by mouth daily as needed for mild constipation.   glucose blood (ACCU-CHEK GUIDE) test strip Use as instructed   insulin glargine (LANTUS SOLOSTAR) 100 UNIT/ML Solostar Pen Inject 42 Units into the  skin daily.   insulin lispro (HUMALOG) 100 UNIT/ML KwikPen Inject 14 Units into the skin 3 (three) times daily. Inject 15 units three times with meals.   Insulin Pen Needle (BD PEN NEEDLE NANO 2ND GEN) 32G X 4 MM MISC Use to inject insulin.   losartan (COZAAR) 100 MG tablet TAKE 1 TABLET BY MOUTH EVERY DAY   montelukast (SINGULAIR) 10 MG tablet TAKE 1 TABLET BY MOUTH EVERY DAY   naloxone (NARCAN) nasal spray 4 mg/0.1 mL USE AS DIRECTED   naratriptan (AMERGE) 2.5 MG tablet TAKE 1 TABLET BY MOUTH AS NEEDED FOR MIGRAINE.   nystatin cream (MYCOSTATIN) Apply 1 application topically 3 (three) times daily as needed for dry skin.   ondansetron (ZOFRAN) 4 MG tablet Take 1 tablet (4 mg total) by mouth every 6 (six) hours. (Patient not taking: Reported on 03/28/2023)   ondansetron (ZOFRAN-ODT) 4 MG disintegrating tablet Take 1 tablet (4 mg total) by mouth every 8 (eight) hours as needed for nausea or vomiting. (Patient not taking: Reported on 03/28/2023)   oxyCODONE (OXY IR/ROXICODONE) 5 MG immediate release tablet Take 5 mg by mouth in the morning, at noon, and at bedtime.   pantoprazole (PROTONIX) 40 MG tablet TAKE 1 TABLET BY MOUTH TWICE A DAY   predniSONE (DELTASONE) 20 MG tablet Take 1 tablet (20 mg total) by mouth daily with breakfast.   predniSONE (DELTASONE) 20 MG tablet Take 60mg  PO daily x 2 days, then40mg  PO daily x 2 days, then 20mg  PO daily x 3 days   promethazine-dextromethorphan (PROMETHAZINE-DM) 6.25-15 MG/5ML syrup Take 5 mLs by mouth 4 (four) times daily as needed. (Patient not taking: Reported on 03/28/2023)   senna-docusate (SENOKOT-S) 8.6-50 MG tablet Take 1 tablet by mouth daily as needed for mild constipation.   topiramate (TOPAMAX) 25 MG tablet Take 50 mg by mouth 2 (two) times daily. (Patient not taking: Reported on 03/28/2023)   triamcinolone cream (KENALOG) 0.1 % APPLY TO AFFECTED AREA TWICE A DAY   WIXELA INHUB 100-50 MCG/ACT AEPB INHALE 1 PUFF INTO THE LUNGS TWICE A DAY   No  facility-administered encounter medications on file as of 03/29/2023.    Allergies (verified) Keflex [cephalexin], Metformin and related, Trulicity [dulaglutide], Aspirin, Morphine and codeine, and Penicillins   History: Past Medical History:  Diagnosis Date   Anxiety    Asthma    Diabetes mellitus without complication (HCC)    Hypertension    Past Surgical History:  Procedure Laterality Date   BREAST SURGERY     CHOLECYSTECTOMY     HIGH RISK BREAST EXCISION     KNEE SURGERY     Family History  Problem Relation Age of Onset   Breast cancer Maternal Aunt    Social History   Socioeconomic History   Marital status: Single    Spouse name: Not on file   Number of children: Not on file   Years of education: Not on file   Highest education  level: 12th grade  Occupational History   Not on file  Tobacco Use   Smoking status: Never   Smokeless tobacco: Never  Vaping Use   Vaping status: Never Used  Substance and Sexual Activity   Alcohol use: Not Currently   Drug use: Never   Sexual activity: Not on file  Other Topics Concern   Not on file  Social History Narrative   Not on file   Social Drivers of Health   Financial Resource Strain: Low Risk  (03/29/2023)   Overall Financial Resource Strain (CARDIA)    Difficulty of Paying Living Expenses: Not hard at all  Food Insecurity: No Food Insecurity (03/29/2023)   Hunger Vital Sign    Worried About Running Out of Food in the Last Year: Never true    Ran Out of Food in the Last Year: Never true  Transportation Needs: Unmet Transportation Needs (03/29/2023)   PRAPARE - Transportation    Lack of Transportation (Medical): Yes    Lack of Transportation (Non-Medical): Yes  Physical Activity: Insufficiently Active (03/29/2023)   Exercise Vital Sign    Days of Exercise per Week: 3 days    Minutes of Exercise per Session: 30 min  Stress: No Stress Concern Present (03/29/2023)   [employer] of Occupational Health -  Occupational Stress Questionnaire    Feeling of Stress : Not at all  Social Connections: Socially Isolated (03/29/2023)   Social Connection and Isolation Panel [NHANES]    Frequency of Communication with Friends and Family: More than three times a week    Frequency of Social Gatherings with Friends and Family: Once a week    Attends Religious Services: Never    Database administrator or Organizations: No    Attends Engineer, structural: Never    Marital Status: Never married    Tobacco Counseling Counseling given: Not Answered   Clinical Intake:  Pre-visit preparation completed: Yes  Pain : No/denies pain Pain Score: 0-No pain     BMI - recorded: 37.6 Nutritional Status: BMI > 30  Obese Nutritional Risks: None Diabetes: Yes CBG done?: No Did pt. bring in CBG monitor from home?: No  How often do you need to have someone help you when you read instructions, pamphlets, or other written materials from your doctor or pharmacy?: 1 - Never What is the last grade level you completed in school?: HSG  Interpreter Needed?: No  Information entered by :: Lashala Laser N. Shep Porter, LPN.   Activities of Daily Living    03/29/2023    2:29 PM 11/16/2022    2:28 PM  In your present state of health, do you have any difficulty performing the following activities:  Hearing? 0 0  Vision? 0 0  Difficulty concentrating or making decisions? 0 0  Walking or climbing stairs? 0 0  Dressing or bathing? 0 0  Doing errands, shopping? 0 0  Preparing Food and eating ? N N  Using the Toilet? N N  In the past six months, have you accidently leaked urine? N N  Do you have problems with loss of bowel control? N N  Managing your Medications? N N  Managing your Finances? N N  Housekeeping or managing your Housekeeping? N N    Patient Care Team: Marcine Matar, MD as PCP - General (Internal Medicine) [facility]  Indicate any recent Medical Services you may have  received from other than [facility] providers in the past year (date may be approximate).  Assessment:   This is a routine wellness examination for Laurie Andrews.  Hearing/Vision screen Hearing Screening - Comments:: Denies hearing difficulties .  Vision Screening - Comments:: Wears rx glasses - up to date with routine eye exams with [facility]    Goals Addressed             This Visit's Progress    My goal for 2025 is to have knee surgery.        Depression Screen    03/29/2023    2:27 PM 11/16/2022    2:27 PM 04/15/2022    9:28 AM 01/15/2022   11:16 AM 11/27/2021   11:17 AM 07/24/2021    2:08 PM 05/08/2020    9:59 AM  PHQ 2/9 Scores  PHQ - 2 Score 0 0 0 1 0 0 0  PHQ- 9 Score 0  2 2  1      Fall Risk    03/29/2023    2:27 PM 11/16/2022    2:28 PM 04/15/2022    9:17 AM 01/15/2022   11:12 AM 11/27/2021   11:17 AM  Fall Risk   Falls in the past year? 0 0 0 0 1  Number falls in past yr: 0 0 0 0 0  Injury with Fall? 0 0 0 0 1  Risk for fall due to : No Fall Risks No Fall Risks No Fall Risks No Fall Risks   Follow up Falls prevention discussed;Falls evaluation completed Falls prevention discussed;Education provided;Falls evaluation completed       MEDICARE RISK AT HOME: Medicare Risk at Home Any stairs in or around the home?: No If so, are there any without handrails?: No Home free of loose throw rugs in walkways, pet beds, electrical cords, etc?: Yes Adequate lighting in your home to reduce risk of falls?: Yes Life alert?: No Use of a cane, walker or w/c?: Yes Grab bars in the bathroom?: No (very careful) Shower chair or bench in shower?: Yes Elevated toilet seat or a handicapped toilet?: No  TIMED UP AND GO:  Was the test performed?  No    Cognitive Function:    03/29/2023    2:29 PM  MMSE - Mini Mental State Exam  Not completed: Unable to complete        03/29/2023    2:29 PM 11/16/2022    2:28 PM 11/27/2021   11:19 AM  6CIT Screen  What Year? 0 points 0  points 0 points  What month? 0 points 0 points 0 points  What time? 0 points 0 points 0 points  Count back from 20 0 points 0 points 0 points  Months in reverse 0 points 0 points 2 points  Repeat phrase 0 points 0 points 0 points  Total Score 0 points 0 points 2 points    Immunizations Immunization History  Administered Date(s) Administered   Influenza Split 04/11/2012   Influenza,inj,Quad PF,6+ Mos 05/08/2020, 11/27/2021   Tdap 11/27/2021   Zoster Recombinant(Shingrix) 10/15/2020, 07/24/2021    TDAP status: Up to date  Flu Vaccine status: Due, Education has been provided regarding the importance of this vaccine. Advised may receive this vaccine at local pharmacy or Health Dept. Aware to provide a copy of the vaccination record if obtained from local pharmacy or Health Dept. Verbalized acceptance and understanding.  Pneumococcal vaccine status: Due, Education has been provided regarding the importance of this vaccine. Advised may receive this vaccine at local pharmacy or Health Dept. Aware to provide a copy of the  vaccination record if obtained from local pharmacy or Health Dept. Verbalized acceptance and understanding.  Covid-19 vaccine status: Declined, Education has been provided regarding the importance of this vaccine but patient still declined. Advised may receive this vaccine at local pharmacy or Health Dept.or vaccine clinic. Aware to provide a copy of the vaccination record if obtained from local pharmacy or Health Dept. Verbalized acceptance and understanding.  Qualifies for Shingles Vaccine? Yes   Zostavax completed No   Shingrix Completed?: Yes  Screening Tests Health Maintenance  Topic Date Due   COVID-19 Vaccine (1) Never done   Pneumococcal Vaccine 89-75 Years old (1 of 2 - PCV) Never done   Cervical Cancer Screening (HPV/Pap Cotest)  Never done   Colonoscopy  Never done   Diabetic kidney evaluation - Urine ACR  07/25/2022   FOOT EXAM  07/25/2022   INFLUENZA  VACCINE  09/09/2022   OPHTHALMOLOGY EXAM  03/04/2023   HEMOGLOBIN A1C  06/06/2023   MAMMOGRAM  08/08/2023   Diabetic kidney evaluation - eGFR measurement  09/03/2023   Medicare Annual Wellness (AWV)  03/28/2024   DTaP/Tdap/Td (2 - Td or Tdap) 11/28/2031   Hepatitis C Screening  Completed   HIV Screening  Completed   Zoster Vaccines- Shingrix  Completed   HPV VACCINES  Aged Out    Health Maintenance  Health Maintenance Due  Topic Date Due   COVID-19 Vaccine (1) Never done   Pneumococcal Vaccine 13-68 Years old (1 of 2 - PCV) Never done   Cervical Cancer Screening (HPV/Pap Cotest)  Never done   Colonoscopy  Never done   Diabetic kidney evaluation - Urine ACR  07/25/2022   FOOT EXAM  07/25/2022   INFLUENZA VACCINE  09/09/2022   OPHTHALMOLOGY EXAM  03/04/2023    Colorectal Cancer screening: Never done  Mammogram status: Completed 08/07/2021. Repeat every year-due every 2 years  Bone Density scan: Never done  Lung Cancer Screening: (Low Dose CT Chest recommended if Age 28-80 years, 20 pack-year currently smoking OR have quit w/in 15years.) does not qualify.   Lung Cancer Screening Referral: no  Additional Screening:  Hepatitis C Screening: does qualify; Completed 11/27/2021  Vision Screening: Recommended annual ophthalmology exams for early detection of glaucoma and other disorders of the eye. Is the patient up to date with their annual eye exam?  No, last done 03/03/2022 Who is the provider or what is the name of the office in which the patient attends annual eye exams? [facility] If pt is not established with a provider, would they like to be referred to a provider to establish care? No .   Dental Screening: Recommended annual dental exams for proper oral hygiene  Diabetic Foot Exam: Diabetic Foot Exam: Overdue, Pt has been advised about the importance in completing this exam. Pt is scheduled for diabetic foot exam on 04/27/2023.  Community Resource Referral / Chronic  Care Management: CRR required this visit?  No   CCM required this visit?  No     Plan:     I have personally reviewed and noted the following in the patient's chart:   Medical and social history Use of alcohol, tobacco or illicit drugs  Current medications and supplements including opioid prescriptions. Patient is currently taking opioid prescriptions. Information provided to patient regarding non-opioid alternatives. Patient advised to discuss non-opioid treatment plan with their provider. Functional ability and status Nutritional status Physical activity Advanced directives List of other physicians Hospitalizations, surgeries, and ER visits in previous 12 months Vitals  Screenings to include cognitive, depression, and falls Referrals and appointments  In addition, I have reviewed and discussed with patient certain preventive protocols, quality metrics, and best practice recommendations. A written personalized care plan for preventive services as well as general preventive health recommendations were provided to patient.     Mickeal Needy, LPN   1/61/0960   After Visit Summary: (MyChart) Due to this being a telephonic visit, the after visit summary with patients personalized plan was offered to patient via MyChart   Nurse Notes: Patient is due for the following: Colonoscopy, Pap Smear, Diabetic Eye Exam, Diabetic Foot Exam, Urine Kidney evaluation and flu vaccine.

## 2023-03-29 NOTE — Patient Instructions (Signed)
Laurie Andrews , Thank you for taking time to come for your Medicare Wellness Visit. I appreciate your ongoing commitment to your health goals. Please review the following plan we discussed and let me know if I can assist you in the future.   Referrals/Orders/Follow-Ups/Clinician RecomSirivomendations: Yes; Keep maintaining your health by keeping your appointments with Dr. Laural Benes and any specialists that you may see.  Call us if you need anything.  Have a great year!!!!  This is a list of the screening recommended for you and due dates:  Health Maintenance  Topic Date Due   COVID-19 Vaccine (1) Never done   Pneumococcal Vaccination (1 of 2 - PCV) Never done   Pap with HPV screening  Never done   Colon Cancer Screening  Never done   Yearly kidney health urinalysis for diabetes  07/25/2022   Complete foot exam   07/25/2022   Flu Shot  09/09/2022   Eye exam for diabetics  03/04/2023   Hemoglobin A1C  06/06/2023   Mammogram  08/08/2023   Yearly kidney function blood test for diabetes  09/03/2023   Medicare Annual Wellness Visit  03/28/2024   DTaP/Tdap/Td vaccine (2 - Td or Tdap) 11/28/2031   Hepatitis C Screening  Completed   HIV Screening  Completed   Zoster (Shingles) Vaccine  Completed   HPV Vaccine  Aged Out    Advanced directives: (Copy Requested) Please bring a copy of your health care power of attorney and living will to the office to be added to your chart at your convenience.  Next Medicare Annual Wellness Visit scheduled for next year: Yes

## 2023-04-02 ENCOUNTER — Other Ambulatory Visit: Payer: Self-pay | Admitting: Internal Medicine

## 2023-04-03 ENCOUNTER — Other Ambulatory Visit: Payer: Self-pay | Admitting: Internal Medicine

## 2023-04-03 DIAGNOSIS — I152 Hypertension secondary to endocrine disorders: Secondary | ICD-10-CM

## 2023-04-03 DIAGNOSIS — K219 Gastro-esophageal reflux disease without esophagitis: Secondary | ICD-10-CM

## 2023-04-04 NOTE — Telephone Encounter (Signed)
 Requested medication (s) are due for refill today: No  Requested medication (s) are on the active medication list: Yes  Last refill:  03/08/23  Future visit scheduled:   Notes to clinic:  See pharmacy requests.    Requested Prescriptions  Pending Prescriptions Disp Refills   losartan (COZAAR) 100 MG tablet [Pharmacy Med Name: LOSARTAN POTASSIUM 100 MG TAB] 90 tablet 1    Sig: TAKE 1 TABLET BY MOUTH EVERY DAY     Cardiovascular:  Angiotensin Receptor Blockers Failed - 04/04/2023  2:53 PM      Failed - Cr in normal range and within 180 days    Creatinine, Ser  Date Value Ref Range Status  09/03/2022 0.87 0.44 - 1.00 mg/dL Final         Failed - K in normal range and within 180 days    Potassium  Date Value Ref Range Status  09/03/2022 4.3 3.5 - 5.1 mmol/L Final         Failed - Last BP in normal range    BP Readings from Last 1 Encounters:  03/28/23 (!) 169/77         Passed - Patient is not pregnant      Passed - Valid encounter within last 6 months    Recent Outpatient Visits           3 months ago Type 2 diabetes mellitus with obesity (HCC)   [facility] [facility] A Dept Of [place]. [facility] Lois Huxley, [place] L, RPH-CPP   7 months ago Moderate nausea and vomiting   [place] [facility] [place] - A Dept Of [place]. [facility] Lois Huxley, [place] L, RPH-CPP   9 months ago Type 2 diabetes mellitus with obesity [facility])   [place] [facility] - A Dept Of [place]. [facility] Lois Huxley, [place] L, RPH-CPP   10 months ago Type 2 diabetes mellitus with obesity [facility])   [place] [facility] - A Dept Of [place]. [facility] Lois Huxley, [place] L, RPH-CPP   11 months ago Type 2 diabetes mellitus with hyperglycemia, with long-term current use of insulin (HCC)   [place] [facility] - A Dept Of [place]. [facility] Marcine Matar, MD       Future Appointments              In 3 weeks Marcine Matar, MD [facility] [place] - A Dept Of Eligha Bridegroom. [facility]             atorvastatin (LIPITOR) 10 MG tablet [Pharmacy Med Name: ATORVASTATIN 10 MG TABLET] 90 tablet 1    Sig: TAKE 1 TABLET BY MOUTH EVERY DAY     Cardiovascular:  Antilipid - Statins Failed - 04/04/2023  2:53 PM      Failed - Lipid Panel in normal range within the last 12 months    Cholesterol, Total  Date Value Ref Range Status  07/24/2021 177 100 - 199 mg/dL Final   LDL Chol Calc (NIH)  Date Value Ref Range Status  07/24/2021 88 0 - 99 mg/dL Final   HDL  Date Value Ref Range Status  07/24/2021 46 >39 mg/dL Final   Triglycerides  Date Value Ref Range Status  07/24/2021 259 (H) 0 - 149 mg/dL Final         Passed - Patient is not pregnant  Passed - Valid encounter within last 12 months    Recent Outpatient Visits           3 months ago Type 2 diabetes mellitus with obesity (HCC)   [place] [facility] A Dept Of [place]. [facility] Lois Huxley, [place] L, RPH-CPP   7 months ago Moderate nausea and vomiting   [place] [facility] [place] - A Dept Of [place]. [facility] Lois Huxley, [place] L, RPH-CPP   9 months ago Type 2 diabetes mellitus with obesity [facility])   [facility] [place] [facility] - A Dept Of [place]. [facility] Lois Huxley, [place] L, RPH-CPP   10 months ago Type 2 diabetes mellitus with obesity [facility])   [place] [facility] - A Dept Of [employer]. [facility] Lois Huxley, [place] L, RPH-CPP   11 months ago Type 2 diabetes mellitus with hyperglycemia, with long-term current use of insulin (HCC)   [place] [facility] - A Dept Of [place]. [facility] Marcine Matar, MD       Future Appointments             In 3 weeks Marcine Matar, MD [facility] [place] - A Dept Of Eligha Bridegroom. [facility]

## 2023-04-06 ENCOUNTER — Other Ambulatory Visit: Payer: Self-pay | Admitting: Internal Medicine

## 2023-04-06 NOTE — Telephone Encounter (Signed)
 Pt. Has appointment. Requested Prescriptions  Pending Prescriptions Disp Refills   losartan (COZAAR) 100 MG tablet [Pharmacy Med Name: LOSARTAN POTASSIUM 100 MG TAB] 30 tablet 0    Sig: TAKE 1 TABLET BY MOUTH EVERY DAY     Cardiovascular:  Angiotensin Receptor Blockers Failed - 04/06/2023  2:57 PM      Failed - Cr in normal range and within 180 days    Creatinine, Ser  Date Value Ref Range Status  09/03/2022 0.87 0.44 - 1.00 mg/dL Final         Failed - K in normal range and within 180 days    Potassium  Date Value Ref Range Status  09/03/2022 4.3 3.5 - 5.1 mmol/L Final         Failed - Last BP in normal range    BP Readings from Last 1 Encounters:  03/28/23 (!) 169/77         Passed - Patient is not pregnant      Passed - Valid encounter within last 6 months    Recent Outpatient Visits           4 months ago Type 2 diabetes mellitus with obesity (HCC)   [facility] Dept Of [place]. [facility] Lois Huxley, Kaka L, RPH-CPP   7 months ago Moderate nausea and vomiting   [facility] - A Dept Of [place]. [facility] Lois Huxley, [place] L, RPH-CPP   9 months ago Type 2 diabetes mellitus with obesity [facility])   [facility] - A Dept Of [place]. [facility] Lois Huxley, S[place]d L, RPH-CPP   10 months ago Type 2 diabetes mellitus with obesity [facility])   [facility] [facility] - A Dept Of [place]. [facility] Lois Huxley, Jal L, RPH-CPP   11 months ago Type 2 diabetes mellitus with hyperglycemia, with long-term current use of insulin (HCC)   [facility] - A Dept Of [place]. [facility] Marcine Matar, MD       Future Appointments             In 3 weeks Marcine Matar, MD [facility] [place] - A Dept Of Eligha Bridegroom. [facility]             atorvastatin (LIPITOR) 10 MG tablet [Pharmacy Med Name:  ATORVASTATIN 10 MG TABLET] 30 tablet 0    Sig: TAKE 1 TABLET BY MOUTH EVERY DAY     Cardiovascular:  Antilipid - Statins Failed - 04/06/2023  2:57 PM      Failed - Lipid Panel in normal range within the last 12 months    Cholesterol, Total  Date Value Ref Range Status  07/24/2021 177 100 - 199 mg/dL Final   LDL Chol Calc (NIH)  Date Value Ref Range Status  07/24/2021 88 0 - 99 mg/dL Final   HDL  Date Value Ref Range Status  07/24/2021 46 >39 mg/dL Final   Triglycerides  Date Value Ref Range Status  07/24/2021 259 (H) 0 - 149 mg/dL Final         Passed - Patient is not pregnant      Passed - Valid encounter within last 12 months    Recent Outpatient Visits           4 months ago Type 2 diabetes mellitus with obesity (HCC)   [facility] - A Dept Of [place]. [facility] Lois Huxley, [place] L, RPH-CPP   7 months ago Moderate nausea and vomiting   [facility] - A Dept Of [place]. [facility] Lois Huxley, [place] L, RPH-CPP   9 months ago Type 2 diabetes mellitus with obesity [facility])   [facility] [place] [facility] - A Dept Of [place]. [facility] Lois Huxley, [place] L, RPH-CPP   10 months ago Type 2 diabetes mellitus with obesity [facility])   [place] [facility] - A Dept Of [place]. [facility] Lois Huxley, [place] L, RPH-CPP   11 months ago Type 2 diabetes mellitus with hyperglycemia, with long-term current use of insulin (HCC)   [place] [facility] - A Dept Of [place]. [facility] Marcine Matar, MD       Future Appointments             In 3 weeks Marcine Matar, MD [facility] [place] - A Dept Of Eligha Bridegroom. [facility]

## 2023-04-18 ENCOUNTER — Other Ambulatory Visit: Payer: Self-pay | Admitting: Pharmacist

## 2023-04-18 MED ORDER — ALBUTEROL SULFATE HFA 108 (90 BASE) MCG/ACT IN AERS: 2.0000 | INHALATION_SPRAY | Freq: Four times a day (QID) | RESPIRATORY_TRACT | 0 refills | Status: DC | PRN

## 2023-04-19 ENCOUNTER — Other Ambulatory Visit: Payer: Self-pay | Admitting: Internal Medicine

## 2023-04-25 ENCOUNTER — Other Ambulatory Visit: Payer: Self-pay | Admitting: Internal Medicine

## 2023-04-25 DIAGNOSIS — J4540 Moderate persistent asthma, uncomplicated: Secondary | ICD-10-CM

## 2023-04-26 DIAGNOSIS — M25562 Pain in left knee: Secondary | ICD-10-CM | POA: Diagnosis not present

## 2023-04-26 DIAGNOSIS — M545 Low back pain, unspecified: Secondary | ICD-10-CM | POA: Diagnosis not present

## 2023-04-26 DIAGNOSIS — M25561 Pain in right knee: Secondary | ICD-10-CM | POA: Diagnosis not present

## 2023-04-26 DIAGNOSIS — Z79891 Long term (current) use of opiate analgesic: Secondary | ICD-10-CM | POA: Diagnosis not present

## 2023-04-26 DIAGNOSIS — G894 Chronic pain syndrome: Secondary | ICD-10-CM | POA: Diagnosis not present

## 2023-04-27 ENCOUNTER — Ambulatory Visit: Payer: 59 | Attending: Internal Medicine | Admitting: Internal Medicine

## 2023-04-27 ENCOUNTER — Other Ambulatory Visit: Payer: Self-pay | Admitting: Internal Medicine

## 2023-04-27 ENCOUNTER — Telehealth: Payer: Self-pay | Admitting: Pharmacist

## 2023-04-27 VITALS — BP 137/77 | HR 80 | Temp 98.1°F | Ht 61.0 in | Wt 193.0 lb

## 2023-04-27 DIAGNOSIS — K219 Gastro-esophageal reflux disease without esophagitis: Secondary | ICD-10-CM | POA: Diagnosis not present

## 2023-04-27 DIAGNOSIS — I10 Essential (primary) hypertension: Secondary | ICD-10-CM | POA: Diagnosis not present

## 2023-04-27 DIAGNOSIS — E785 Hyperlipidemia, unspecified: Secondary | ICD-10-CM | POA: Diagnosis not present

## 2023-04-27 DIAGNOSIS — J4540 Moderate persistent asthma, uncomplicated: Secondary | ICD-10-CM | POA: Diagnosis not present

## 2023-04-27 DIAGNOSIS — Z1211 Encounter for screening for malignant neoplasm of colon: Secondary | ICD-10-CM

## 2023-04-27 DIAGNOSIS — R748 Abnormal levels of other serum enzymes: Secondary | ICD-10-CM | POA: Diagnosis not present

## 2023-04-27 DIAGNOSIS — Z6836 Body mass index (BMI) 36.0-36.9, adult: Secondary | ICD-10-CM | POA: Diagnosis not present

## 2023-04-27 DIAGNOSIS — L309 Dermatitis, unspecified: Secondary | ICD-10-CM

## 2023-04-27 DIAGNOSIS — E669 Obesity, unspecified: Secondary | ICD-10-CM | POA: Diagnosis not present

## 2023-04-27 DIAGNOSIS — E1169 Type 2 diabetes mellitus with other specified complication: Secondary | ICD-10-CM

## 2023-04-27 DIAGNOSIS — E1159 Type 2 diabetes mellitus with other circulatory complications: Secondary | ICD-10-CM

## 2023-04-27 DIAGNOSIS — Z794 Long term (current) use of insulin: Secondary | ICD-10-CM | POA: Diagnosis not present

## 2023-04-27 DIAGNOSIS — Z23 Encounter for immunization: Secondary | ICD-10-CM | POA: Diagnosis not present

## 2023-04-27 LAB — POCT GLYCOSYLATED HEMOGLOBIN (HGB A1C): HbA1c, POC (controlled diabetic range): 10.2 % — AB (ref 0.0–7.0)

## 2023-04-27 LAB — GLUCOSE, POCT (MANUAL RESULT ENTRY): POC Glucose: 260 mg/dL — AB (ref 70–99)

## 2023-04-27 MED ORDER — ESOMEPRAZOLE MAGNESIUM 20 MG PO CPDR: 20.0000 mg | DELAYED_RELEASE_CAPSULE | Freq: Two times a day (BID) | ORAL | 6 refills | Status: DC

## 2023-04-27 MED ORDER — FLUTICASONE-SALMETEROL 100-50 MCG/ACT IN AEPB: 1.0000 | INHALATION_SPRAY | Freq: Two times a day (BID) | RESPIRATORY_TRACT | 6 refills | Status: DC

## 2023-04-27 MED ORDER — VALSARTAN 80 MG PO TABS: 80.0000 mg | ORAL_TABLET | Freq: Every day | ORAL | 3 refills | Status: DC

## 2023-04-27 MED ORDER — INSULIN LISPRO (1 UNIT DIAL) 100 UNIT/ML (KWIKPEN): 17.0000 [IU] | PEN_INJECTOR | Freq: Three times a day (TID) | SUBCUTANEOUS | 5 refills | Status: DC

## 2023-04-27 MED ORDER — LANTUS SOLOSTAR 100 UNIT/ML ~~LOC~~ SOPN: 52.0000 [IU] | PEN_INJECTOR | Freq: Every day | SUBCUTANEOUS | 6 refills | Status: DC

## 2023-04-27 MED ORDER — TRIAMCINOLONE ACETONIDE 0.1 % EX CREA: TOPICAL_CREAM | CUTANEOUS | 0 refills | Status: AC

## 2023-04-27 NOTE — Progress Notes (Unsigned)
 Patient ID: Laurie Andrews, female    DOB: August 28, 1970  MRN: [id number]  CC: Diabetes (DM f/u./Please verify correct inhaler in chart - pt uses ProAir & will need a 3 mo rx/Rash on back, itchy X2 days/Yes to flu & pneumonia vax)   Subjective: Laurie Andrews is a 53 y.o. female who presents for chronic ds management. Her concerns today include:  Patient with history of DM type II with peripheral neuropathy, HTN, opiate dependence, anxiety disorder, asthma, migraines, OA knees, alopecia.      Discussed the use of AI scribe software for clinical note transcription with the patient, who gave verbal consent to proceed.  History of Present Illness   The patient, with a history of diabetes, hypertension, and asthma, presents for a follow-up visit.   Rash:  She reports a new rash on her upper back over RT shoulder that has been present for two days. The rash is itchy, and the patient has been applying triple antibiotic ointment without relief.  Asthma: reports issues with her asthma medication. She has been using an albuterol HFA inhaler and Wixela inhaler. However, she reports receiving incorrect inhalers from the pharmacy; reports she needs the ProAir, the others do not work for her.  Has Albuterol HFA 200 inhaler with her today.  States this is the ProAir, states the red and white box it comes in says ProAir The patient uses the albuterol inhaler as needed, usually no more than twice a day, but more frequently when she has a cold.  DM: Results for orders placed or performed in visit on 04/27/23  POCT glucose (manual entry)   Collection Time: 04/27/23  2:35 PM  Result Value Ref Range   POC Glucose 260 (A) 70 - 99 mg/dl  POCT glycosylated hemoglobin (Hb A1C)   Collection Time: 04/27/23  3:04 PM  Result Value Ref Range   Hemoglobin A1C     HbA1c POC (<> result, manual entry)     HbA1c, POC (prediabetic range)     HbA1c, POC (controlled diabetic range) 10.2 (A) 0.0 - 7.0 %  The patient  has been using a Libre sensor to monitor her blood sugar levels, but the sensor fell off a few days ago and does so intermittently. The patient reports blood sugar levels ranging from 196 to 200, with occasional drops.  In looking at her information on her cell phone from the CGM, over the past 1 week, TIR was 29%, blood sugars between 181-250 50% of the times and greater than 250 21% of the times.  Over the past 1 month, she has been in target range only 18% of the times.   -Reports taking glargine insulin 48 units daily and NovoLog 14 units with meals.   -Feels she is doing okay with eating habits.  HTN: the patient reports taking amlodipine 10 mg and Cozaar 100 mg daily and took already for today.. She checks her blood pressure once a day, with readings ranging from 127 to 135 for the top number. The patient's blood pressure at today's visit was 167/84.      GERD:  The patient also reports severe heartburn, despite taking pantoprazole 40 mg twice a day (I note did however that the last prescription was sent in November for 90-day supply). The heartburn is so severe that it causes vomiting and has led to the patient taking caropetate to calm her stomach. The patient also reports a recent three-day episode of watery diarrhea.    Patient Active Problem  List   Diagnosis Date Noted   Hyperlipidemia 07/25/2021   Influenza vaccine needed 05/08/2020   Obesity (BMI 30.0-34.9) 05/08/2020   Moderate persistent asthma without complication 05/08/2020   Post-traumatic osteoarthritis of both knees 05/08/2020   Acute pyelonephritis 04/09/2020   Constipation 04/09/2020   Type 2 diabetes mellitus with hyperglycemia (HCC) 04/08/2020   Essential hypertension 04/08/2020     Current Outpatient Medications on File Prior to Visit  Medication Sig Dispense Refill   Accu-Chek Softclix Lancets lancets USE AS INSTRUCTED 300 each 2   albuterol (PROAIR HFA) 108 (90 Base) MCG/ACT inhaler Inhale 2 puffs into the lungs  every 6 (six) hours as needed for wheezing or shortness of breath. 8.5 g 0   albuterol (PROVENTIL) (2.5 MG/3ML) 0.083% nebulizer solution INHALE 3 ML BY NEBULIZATION EVERY 6 HOURS AS NEEDED FOR WHEEZING OR SHORTNESS OF BREATH 150 mL 0   amitriptyline (ELAVIL) 100 MG tablet Take 100 mg by mouth as needed for sleep.     amLODipine (NORVASC) 10 MG tablet TAKE 1 TABLET BY MOUTH EVERY DAY 90 tablet 0   atorvastatin (LIPITOR) 10 MG tablet TAKE 1 TABLET BY MOUTH EVERY DAY 30 tablet 0   azithromycin (ZITHROMAX) 250 MG tablet Take 500mg  PO daily x1d and then 250mg  daily x4 days 6 each 0   benzonatate (TESSALON) 200 MG capsule Take 1 capsule (200 mg total) by mouth 2 (two) times daily as needed for cough. 20 capsule 0   cetirizine (ZYRTEC) 10 MG tablet Take 10 mg by mouth daily.     Cholecalciferol (VITAMIN D3) 25 MCG (1000 UT) capsule Take 1 capsule (1,000 Units total) by mouth daily. 100 capsule 1   Continuous Blood Gluc Receiver (FREESTYLE LIBRE 3 READER) DEVI 1 application  by Does not apply route in the morning, at noon, and at bedtime. Use to check blood sugar TID. E11.65 1 each 0   Continuous Glucose Sensor (FREESTYLE LIBRE 3 SENSOR) MISC USE TO CHECK BLOOD SUGAR THREE TIMES A DAY 2 each 3   diclofenac Sodium (VOLTAREN) 1 % GEL Apply 2 g topically 4 (four) times daily. 50 g 0   fluticasone-salmeterol (WIXELA INHUB) 100-50 MCG/ACT AEPB INHALE 1 PUFF INTO THE LUNGS TWICE A DAY 60 each 2   GAVILAX 17 GM/SCOOP powder Take 17 g by mouth daily as needed for mild constipation.     glucose blood (ACCU-CHEK GUIDE) test strip Use as instructed 100 each 12   insulin glargine (LANTUS SOLOSTAR) 100 UNIT/ML Solostar Pen INJECT 24 UNITS INTO THE SKIN AT BEDTIME 15 mL 0   insulin lispro (HUMALOG) 100 UNIT/ML KwikPen Inject 14 Units into the skin 3 (three) times daily. Inject 15 units three times with meals. 45 mL 0   Insulin Pen Needle (BD PEN NEEDLE NANO 2ND GEN) 32G X 4 MM MISC Use to inject insulin. 100 each 3    losartan (COZAAR) 100 MG tablet TAKE 1 TABLET BY MOUTH EVERY DAY 30 tablet 0   montelukast (SINGULAIR) 10 MG tablet TAKE 1 TABLET BY MOUTH EVERY DAY 90 tablet 0   naloxone (NARCAN) nasal spray 4 mg/0.1 mL USE AS DIRECTED 2 each 0   naratriptan (AMERGE) 2.5 MG tablet TAKE 1 TABLET BY MOUTH AS NEEDED FOR MIGRAINE. 10 tablet 1   nystatin cream (MYCOSTATIN) Apply 1 application topically 3 (three) times daily as needed for dry skin.     ondansetron (ZOFRAN) 4 MG tablet Take 1 tablet (4 mg total) by mouth every 6 (six) hours. 18 tablet  0   ondansetron (ZOFRAN-ODT) 4 MG disintegrating tablet Take 1 tablet (4 mg total) by mouth every 8 (eight) hours as needed for nausea or vomiting. 20 tablet 0   oxyCODONE (OXY IR/ROXICODONE) 5 MG immediate release tablet Take 5 mg by mouth in the morning, at noon, and at bedtime.     pantoprazole (PROTONIX) 40 MG tablet TAKE 1 TABLET BY MOUTH TWICE A DAY 180 tablet 0   predniSONE (DELTASONE) 20 MG tablet Take 60mg  PO daily x 2 days, then40mg  PO daily x 2 days, then 20mg  PO daily x 3 days 13 tablet 0   promethazine-dextromethorphan (PROMETHAZINE-DM) 6.25-15 MG/5ML syrup Take 5 mLs by mouth 4 (four) times daily as needed. 118 mL 0   senna-docusate (SENOKOT-S) 8.6-50 MG tablet Take 1 tablet by mouth daily as needed for mild constipation. 30 tablet 1   topiramate (TOPAMAX) 25 MG tablet Take 50 mg by mouth 2 (two) times daily.     triamcinolone cream (KENALOG) 0.1 % APPLY TO AFFECTED AREA TWICE A DAY 30 g 0   No current facility-administered medications on file prior to visit.    Allergies  Allergen Reactions   Keflex [Cephalexin] Nausea And Vomiting   Metformin And Related    Trulicity [Dulaglutide] Nausea And Vomiting   Aspirin Nausea And Vomiting and Rash   Morphine And Codeine Itching and Rash   Penicillins Nausea And Vomiting and Rash    Social History   Socioeconomic History   Marital status: Single    Spouse name: Not on file   Number of children: Not on  file   Years of education: Not on file   Highest education level: 12th grade  Occupational History   Not on file  Tobacco Use   Smoking status: Never   Smokeless tobacco: Never  Vaping Use   Vaping status: Never Used  Substance and Sexual Activity   Alcohol use: Not Currently   Drug use: Never   Sexual activity: Not on file  Other Topics Concern   Not on file  Social History Narrative   Not on file   Social Drivers of Health   Financial Resource Strain: Low Risk  (03/29/2023)   Overall Financial Resource Strain (CARDIA)    Difficulty of Paying Living Expenses: Not hard at all  Food Insecurity: No Food Insecurity (03/29/2023)   Hunger Vital Sign    Worried About Running Out of Food in the Last Year: Never true    Ran Out of Food in the Last Year: Never true  Transportation Needs: Unmet Transportation Needs (03/29/2023)   PRAPARE - Transportation    Lack of Transportation (Medical): Yes    Lack of Transportation (Non-Medical): Yes  Physical Activity: Insufficiently Active (03/29/2023)   Exercise Vital Sign    Days of Exercise per Week: 3 days    Minutes of Exercise per Session: 30 min  Stress: No Stress Concern Present (03/29/2023)   [employer] of Occupational Health - Occupational Stress Questionnaire    Feeling of Stress : Not at all  Social Connections: Socially Isolated (03/29/2023)   Social Connection and Isolation Panel [NHANES]    Frequency of Communication with Friends and Family: More than three times a week    Frequency of Social Gatherings with Friends and Family: Once a week    Attends Religious Services: Never    Database administrator or Organizations: No    Attends Banker Meetings: Never    Marital Status: Never married  Catering manager  Violence: Not At Risk (03/29/2023)   Humiliation, Afraid, Rape, and Kick questionnaire    Fear of Current or Ex-Partner: No    Emotionally Abused: No    Physically Abused: No    Sexually Abused: No     Family History  Problem Relation Age of Onset   Breast cancer Maternal Aunt     Past Surgical History:  Procedure Laterality Date   BREAST SURGERY     CHOLECYSTECTOMY     HIGH RISK BREAST EXCISION     KNEE SURGERY      ROS: Review of Systems Negative except as stated above  PHYSICAL EXAM: BP 137/77 (BP Location: Left Arm, Patient Position: Sitting, Cuff Size: Normal)   Pulse 80   Temp 98.1 F (36.7 C) (Oral)   Ht 5\' 1"  (1.549 m)   Wt 193 lb (87.5 kg)   SpO2 99%   BMI 36.47 kg/m   Wt Readings from Last 3 Encounters:  04/27/23 193 lb (87.5 kg)  03/29/23 199 lb (90.3 kg)  11/16/22 184 lb (83.5 kg)  Repeat blood pressure 167/84  Physical Exam  General appearance - alert, well appearing, and in no distress Mental status - normal mood, behavior, speech, dress, motor activity, and thought processes Neck - supple, no significant adenopathy Chest - clear to auscultation, no wheezes, rales or rhonchi, symmetric air entry Heart - normal rate, regular rhythm, normal S1, S2, no murmurs, rubs, clicks or gallops Extremities -trace bilateral lower extremity edema Skin: She has excoriation over the right trapezius      Latest Ref Rng & Units 09/03/2022    8:47 PM 07/24/2021    3:01 PM 04/09/2020    4:28 AM  CMP  Glucose 70 - 99 mg/dL 409  811  914   BUN 6 - 20 mg/dL 10  11  6    Creatinine 0.44 - 1.00 mg/dL 7.82  9.56  2.13   Sodium 135 - 145 mmol/L 132  134  136   Potassium 3.5 - 5.1 mmol/L 4.3  4.7  3.9   Chloride 98 - 111 mmol/L 99  97  104   CO2 22 - 32 mmol/L 23  24  20    Calcium 8.9 - 10.3 mg/dL 9.0  9.8  9.3   Total Protein 6.5 - 8.1 g/dL 7.1  7.7    Total Bilirubin 0.3 - 1.2 mg/dL 0.4  0.6    Alkaline Phos 38 - 126 U/L 132  188    AST 15 - 41 U/L 16  15    ALT 0 - 44 U/L 20  26     Lipid Panel     Component Value Date/Time   CHOL 177 07/24/2021 1501   TRIG 259 (H) 07/24/2021 1501   HDL 46 07/24/2021 1501   CHOLHDL 3.8 07/24/2021 1501   LDLCALC 88  07/24/2021 1501    CBC    Component Value Date/Time   WBC 7.3 09/03/2022 2047   RBC 4.82 09/03/2022 2047   HGB 12.4 09/03/2022 2047   HGB 13.7 07/24/2021 1501   HCT 39.4 09/03/2022 2047   HCT 42.6 07/24/2021 1501   PLT 337 09/03/2022 2047   PLT 356 07/24/2021 1501   MCV 81.7 09/03/2022 2047   MCV 79 07/24/2021 1501   MCH 25.7 (L) 09/03/2022 2047   MCHC 31.5 09/03/2022 2047   RDW 12.4 09/03/2022 2047   RDW 12.0 07/24/2021 1501   LYMPHSABS 2.5 09/03/2022 2047   MONOABS 0.6 09/03/2022 2047   EOSABS 0.1  09/03/2022 2047   BASOSABS 0.0 09/03/2022 2047    ASSESSMENT AND PLAN:  Patient was given the opportunity to ask questions.  Patient verbalized understanding of the plan and was able to repeat key elements of the plan.   This documentation was completed using Paediatric nurse.  Any transcriptional errors are unintentional.  Orders Placed This Encounter  Procedures   POCT glycosylated hemoglobin (Hb A1C)   POCT glucose (manual entry)     Requested Prescriptions    No prescriptions requested or ordered in this encounter    No follow-ups on file.  Jonah Blue, MD, FACP

## 2023-04-27 NOTE — Patient Instructions (Signed)
 Increase Lantus insulin to 52 units daily. Increase your mealtime insulin to 17 units with meals. Once you finish your current bottle of Losartan, please change to the valsartan 80 mg daily.  The updated prescription has been sent to your pharmacy. We will have you follow-up with the clinical pharmacist in about a month for your diabetes.  Stop the pantoprazole.  We have changed it to 1 called Nexium 20 mg twice a day.  We have referred you to the gastroenterologist. GERD precautions discussed.  Advised to avoid certain foods like spicy foods, tomato-based foods, juices and excessive caffeine.  Advised to eat his last meal at least 2 to 3 hours before laying down at nights and to sleep with his head slightly elevated.

## 2023-04-27 NOTE — Telephone Encounter (Signed)
 Can we start a PA for Humalog pens? When I tried to order it says these are preferred over Novolog. However, pharmacy is saying PA is required for Humalog pens.

## 2023-04-28 ENCOUNTER — Encounter: Payer: Self-pay | Admitting: Internal Medicine

## 2023-04-28 ENCOUNTER — Other Ambulatory Visit: Payer: Self-pay

## 2023-04-28 ENCOUNTER — Other Ambulatory Visit: Payer: Self-pay | Admitting: Internal Medicine

## 2023-04-28 ENCOUNTER — Ambulatory Visit: Payer: 59 | Admitting: Internal Medicine

## 2023-04-28 DIAGNOSIS — E1169 Type 2 diabetes mellitus with other specified complication: Secondary | ICD-10-CM

## 2023-04-28 MED ORDER — PROAIR HFA 108 (90 BASE) MCG/ACT IN AERS: 2.0000 | INHALATION_SPRAY | Freq: Four times a day (QID) | RESPIRATORY_TRACT | 6 refills | Status: DC | PRN

## 2023-04-28 MED ORDER — INSULIN LISPRO (1 UNIT DIAL) 100 UNIT/ML (KWIKPEN): 17.0000 [IU] | PEN_INJECTOR | Freq: Three times a day (TID) | SUBCUTANEOUS | 5 refills | Status: DC

## 2023-04-29 ENCOUNTER — Encounter: Payer: Self-pay | Admitting: Internal Medicine

## 2023-04-29 LAB — COMPREHENSIVE METABOLIC PANEL
ALT: 42 IU/L — ABNORMAL HIGH (ref 0–32)
AST: 27 IU/L (ref 0–40)
Albumin: 4.2 g/dL (ref 3.8–4.9)
Alkaline Phosphatase: 172 IU/L — ABNORMAL HIGH (ref 44–121)
BUN/Creatinine Ratio: 12 (ref 9–23)
BUN: 9 mg/dL (ref 6–24)
Bilirubin Total: 0.4 mg/dL (ref 0.0–1.2)
CO2: 22 mmol/L (ref 20–29)
Calcium: 9.4 mg/dL (ref 8.7–10.2)
Chloride: 105 mmol/L (ref 96–106)
Creatinine, Ser: 0.75 mg/dL (ref 0.57–1.00)
Globulin, Total: 2.7 g/dL (ref 1.5–4.5)
Glucose: 230 mg/dL — ABNORMAL HIGH (ref 70–99)
Potassium: 4.3 mmol/L (ref 3.5–5.2)
Sodium: 142 mmol/L (ref 134–144)
Total Protein: 6.9 g/dL (ref 6.0–8.5)
eGFR: 95 mL/min/{1.73_m2} (ref >59)

## 2023-04-29 LAB — MICROALBUMIN / CREATININE URINE RATIO
Creatinine, Urine: 161 mg/dL
Microalb/Creat Ratio: 142 mg/g{creat} — ABNORMAL HIGH (ref 0–29)
Microalbumin, Urine: 228 ug/mL

## 2023-04-29 NOTE — Addendum Note (Signed)
 Addended by: Jonah Blue B on: 04/29/2023 02:04 PM   Modules accepted: Orders

## 2023-05-01 ENCOUNTER — Encounter: Payer: Self-pay | Admitting: Internal Medicine

## 2023-05-02 ENCOUNTER — Inpatient Hospital Stay: Payer: 59 | Admitting: Internal Medicine

## 2023-05-02 LAB — SPECIMEN STATUS REPORT

## 2023-05-02 LAB — GAMMA GT: GGT: 88 IU/L — ABNORMAL HIGH (ref 0–60)

## 2023-05-03 ENCOUNTER — Other Ambulatory Visit: Payer: Self-pay | Admitting: Internal Medicine

## 2023-05-03 DIAGNOSIS — R748 Abnormal levels of other serum enzymes: Secondary | ICD-10-CM

## 2023-05-07 ENCOUNTER — Other Ambulatory Visit: Payer: Self-pay | Admitting: Internal Medicine

## 2023-05-09 NOTE — Telephone Encounter (Signed)
 Requested Prescriptions  Refused Prescriptions Disp Refills   losartan (COZAAR) 100 MG tablet [Pharmacy Med Name: LOSARTAN POTASSIUM 100 MG TAB] 30 tablet 0    Sig: TAKE 1 TABLET BY MOUTH EVERY DAY     Cardiovascular:  Angiotensin Receptor Blockers Passed - 05/09/2023  4:12 PM      Passed - Cr in normal range and within 180 days    Creatinine, Ser  Date Value Ref Range Status  04/27/2023 0.75 0.57 - 1.00 mg/dL Final         Passed - K in normal range and within 180 days    Potassium  Date Value Ref Range Status  04/27/2023 4.3 3.5 - 5.2 mmol/L Final         Passed - Patient is not pregnant      Passed - Last BP in normal range    BP Readings from Last 1 Encounters:  04/27/23 137/77         Passed - Valid encounter within last 6 months    Recent Outpatient Visits           1 week ago Type 2 diabetes mellitus with obesity (HCC)   [facility] A Dept Of [place]. [facility] Jonah Blue B, MD   5 months ago Type 2 diabetes mellitus with obesity [facility])   [facility] - A Dept Of [place]. [facility] Lois Huxley, [place] L, RPH-CPP   8 months ago Moderate nausea and vomiting   [facility] [place] - A Dept Of [place]. [facility] Lois Huxley, [place] L, RPH-CPP   10 months ago Type 2 diabetes mellitus with obesity [facility])   [place] [facility] - A Dept Of [place]. [facility] Lois Huxley, [place] L, RPH-CPP   11 months ago Type 2 diabetes mellitus with obesity [facility])   [facility] - A Dept Of . [facility] Drucilla Chalet, RPH-CPP       Future Appointments             In 3 weeks Lois Huxley, Cornelius Moras, RPH-CPP [place] [facility] [place] - A Dept Of . [facility]   In 2 months Laural Benes, Binnie Rail, MD [facility] [place] - A Dept Of Eligha Bridegroom. [facility]

## 2023-05-16 ENCOUNTER — Inpatient Hospital Stay: Admission: RE | Admit: 2023-05-16 | Source: Ambulatory Visit

## 2023-05-23 ENCOUNTER — Ambulatory Visit
Admission: RE | Admit: 2023-05-23 | Discharge: 2023-05-23 | Disposition: A | Discharge status: Home / Self Care | Source: Ambulatory Visit | Attending: Internal Medicine | Admitting: Internal Medicine

## 2023-05-23 DIAGNOSIS — K769 Liver disease, unspecified: Secondary | ICD-10-CM | POA: Diagnosis not present

## 2023-05-23 DIAGNOSIS — R748 Abnormal levels of other serum enzymes: Secondary | ICD-10-CM

## 2023-05-24 DIAGNOSIS — Z79891 Long term (current) use of opiate analgesic: Secondary | ICD-10-CM | POA: Diagnosis not present

## 2023-05-24 DIAGNOSIS — G894 Chronic pain syndrome: Secondary | ICD-10-CM | POA: Diagnosis not present

## 2023-05-25 ENCOUNTER — Other Ambulatory Visit: Payer: Self-pay | Admitting: Internal Medicine

## 2023-05-25 ENCOUNTER — Encounter: Payer: Self-pay | Admitting: Internal Medicine

## 2023-05-25 DIAGNOSIS — R7989 Other specified abnormal findings of blood chemistry: Secondary | ICD-10-CM

## 2023-05-25 DIAGNOSIS — R932 Abnormal findings on diagnostic imaging of liver and biliary tract: Secondary | ICD-10-CM

## 2023-05-25 DIAGNOSIS — K219 Gastro-esophageal reflux disease without esophagitis: Secondary | ICD-10-CM

## 2023-05-26 ENCOUNTER — Other Ambulatory Visit: Payer: Self-pay

## 2023-06-01 ENCOUNTER — Ambulatory Visit: Payer: Self-pay

## 2023-06-01 NOTE — Telephone Encounter (Signed)
 Chief Complaint: Cough Symptoms: Productive cough, wheezing, earache, yellow sputum, chills, sore throat Frequency: Constant  Pertinent Negatives: Patient denies fever, nausea, vomiting Disposition: [] ED /[] Urgent Care (no appt availability in office) / [] Appointment(In office/virtual)/ []  [facility]/ [] Home Care/ [] Refused Recommended Disposition /[]  Mobile Bus/ [x]  Follow-up with PCP Additional Notes: Patient states she had a sore throat that started last week and has continued to get worse but now she also has a productive cough with yellow sputum, earache, and wheezing. Patient reports she is using her inhaler and taking OTC Robitussin. Care advice was given and no appointments are available in office. Patient has an appointment tomorrow morning with pharmacy and want to know if she can be worked in to be seen since she will already be in the office. Advised I would need to get approval for a work in appointment. Forwarding to PCP for approval at this time.    Copied from CRM [id number]. Topic: Clinical - Red Word Triage >> Jun 01, 2023  1:17 PM Ethelle Herb L wrote: [facility] that prompted transfer to Nurse Triage: Left ear, throat bothering her pain when swallowing Reason for Disposition  Earache  Answer Assessment - Initial Assessment Questions 1. ONSET: "When did the cough begin?"      1 week ago  2. SEVERITY: "How bad is the cough today?"      Mild to Moderate 3. SPUTUM: "Describe the color of your sputum" (none, dry cough; clear, white, yellow, green)     Yellow  4. HEMOPTYSIS: "Are you coughing up any blood?" If so ask: "How much?" (flecks, streaks, tablespoons, etc.)     No  5. DIFFICULTY BREATHING: "Are you having difficulty breathing?" If Yes, ask: "How bad is it?" (e.g., mild, moderate, severe)    - MILD: No SOB at rest, mild SOB with walking, speaks normally in sentences, can lie down, no retractions, pulse < 100.    - MODERATE: SOB at rest, SOB with minimal  exertion and prefers to sit, cannot lie down flat, speaks in phrases, mild retractions, audible wheezing, pulse 100-120.    - SEVERE: Very SOB at rest, speaks in single words, struggling to breathe, sitting hunched forward, retractions, pulse > 120      Wheezing  6. FEVER: "Do you have a fever?" If Yes, ask: "What is your temperature, how was it measured, and when did it start?"     No  7. CARDIAC HISTORY: "Do you have any history of heart disease?" (e.g., heart attack, congestive heart failure)      N/A 8. LUNG HISTORY: "Do you have any history of lung disease?"  (e.g., pulmonary embolus, asthma, emphysema)     Asthma 9. PE RISK FACTORS: "Do you have a history of blood clots?" (or: recent major surgery, recent prolonged travel, bedridden)     No  10. OTHER SYMPTOMS: "Do you have any other symptoms?" (e.g., runny nose, wheezing, chest pain)       Hot and cold chills, sore throat, left earache, wheezing,  11. PREGNANCY: "Is there any chance you are pregnant?" "When was your last menstrual period?"       N/A 12. TRAVEL: "Have you traveled out of the country in the last month?" (e.g., travel history, exposures)       No  Protocols used: Cough - Acute Productive-A-AH

## 2023-06-02 ENCOUNTER — Other Ambulatory Visit: Payer: Self-pay

## 2023-06-02 ENCOUNTER — Telehealth: Payer: Self-pay | Admitting: Pharmacist

## 2023-06-02 ENCOUNTER — Encounter: Payer: Self-pay | Admitting: Pharmacist

## 2023-06-02 ENCOUNTER — Ambulatory Visit: Attending: Internal Medicine | Admitting: Pharmacist

## 2023-06-02 ENCOUNTER — Encounter: Payer: Self-pay | Admitting: Internal Medicine

## 2023-06-02 DIAGNOSIS — E669 Obesity, unspecified: Secondary | ICD-10-CM

## 2023-06-02 DIAGNOSIS — J069 Acute upper respiratory infection, unspecified: Secondary | ICD-10-CM

## 2023-06-02 DIAGNOSIS — Z794 Long term (current) use of insulin: Secondary | ICD-10-CM

## 2023-06-02 DIAGNOSIS — E1165 Type 2 diabetes mellitus with hyperglycemia: Secondary | ICD-10-CM | POA: Diagnosis not present

## 2023-06-02 DIAGNOSIS — E1169 Type 2 diabetes mellitus with other specified complication: Secondary | ICD-10-CM

## 2023-06-02 DIAGNOSIS — B9689 Other specified bacterial agents as the cause of diseases classified elsewhere: Secondary | ICD-10-CM

## 2023-06-02 LAB — POCT RAPID STREP A DIPSTICK TEST: Rapid Strep A Screen: NEGATIVE

## 2023-06-02 MED ORDER — FLUTICASONE PROPIONATE 50 MCG/ACT NA SUSP
2.0000 | Freq: Every day | NASAL | 6 refills | Status: DC
  Filled 2023-06-02: qty 16, 30d supply, fill #0

## 2023-06-02 MED ORDER — INSULIN LISPRO (1 UNIT DIAL) 100 UNIT/ML (KWIKPEN): 18.0000 [IU] | PEN_INJECTOR | Freq: Three times a day (TID) | SUBCUTANEOUS | 5 refills | Status: DC

## 2023-06-02 MED ORDER — CETIRIZINE HCL 10 MG PO TABS
10.0000 mg | ORAL_TABLET | Freq: Every day | ORAL | 11 refills | Status: AC
  Filled 2023-06-02: qty 30, 30d supply, fill #0
  Filled 2023-07-28: qty 30, 30d supply, fill #1

## 2023-06-02 MED ORDER — FREESTYLE LIBRE 3 PLUS SENSOR MISC: 6 refills | Status: DC

## 2023-06-02 MED ORDER — LANTUS SOLOSTAR 100 UNIT/ML ~~LOC~~ SOPN: 58.0000 [IU] | PEN_INJECTOR | Freq: Every day | SUBCUTANEOUS | 6 refills | Status: DC

## 2023-06-02 NOTE — Progress Notes (Signed)
 S:    PCP: Dr. Lincoln Renshaw  53 y.o. female who presents for diabetes evaluation, education, and management.  PMH is significant for 2DM w/ peripheral neuropathy, HTN, opiate dependence, anxiety disorder, asthma, migraines, OA knees, alopecia. Patient was referred by Primary Care Provider, Dr. Lincoln Renshaw, on 04/27/2023.   At that visit with Dr. Lincoln Renshaw A1c was 10.2%, unchanged from previous. CGM report showed poor home glucemic control. She endorsed adherence to her insulin  regimen and her doses were both increased.   Today patient presents to clinic in poor spirits. She endorses a 1 week history of sore throat with pressure/pain in her L ear. Denies any fever but does feel chills off and on. Endorses a productive cough with yellow-green sputum production. Denies any sinus pressure or HA.   Regarding her DM, patient reports that her Jerrilyn Moras 3 sensors are being phased out and replaced by FL3+. She requests a rxn for this today. Just put her last libre 3 sensor on and her CGM report is below.   Current diabetes medications include: Lantus  58 units daily in AM, Humalog  14 units TID before meals Patient reports adherence to taking all medications.   Insurance coverage: [employer] Medicare + Medicaid   Patient denies hypoglycemic events since last visit.   Reported home fasting blood sugars: see CGM data below   Patient denies nocturia (nighttime urination).  Patient reports neuropathy (nerve pain). Patient denies visual changes. Patient reports self foot exams.  Patient reported  2-3 meals/day Breakfast: glucose shake, sometimes skips Lunch: grilled chicken, salad, vegetables Dinner: [place] tacos Snacks: reports usually not snacking, sometimes oranges or grapes Beverages: mostly water, coke zero  Exercise: walks around complex (15-20 mins) 3 times a week; starting physical therapy   O:  Date of Download: 06/02/2023 for a 28 day report % Time CGM is active: 35% Average Glucose: 201  mg/dL Glucose Management Indicator: 8.1  Glucose Variability: 31.5 (goal <36%) Time in Goal:  - Time in range 70-180: 42% - Time above range: 57% - Time below range: 1%  Lab Results  Component Value Date   HGBA1C 10.2 (A) 04/27/2023   There were no vitals filed for this visit.  Lipid Panel     Component Value Date/Time   CHOL 177 07/24/2021 1501   TRIG 259 (H) 07/24/2021 1501   HDL 46 07/24/2021 1501   CHOLHDL 3.8 07/24/2021 1501   LDLCALC 88 07/24/2021 1501    Clinical Atherosclerotic Cardiovascular Disease (ASCVD): No  The 10-year ASCVD risk score (Arnett DK, et al., 2019) is: 14.4%   Values used to calculate the score:     Age: 10 years     Sex: Female     Is Non-Hispanic African American: Yes     Diabetic: Yes     Tobacco smoker: No     Systolic Blood Pressure: 137 mmHg     Is BP treated: Yes     HDL Cholesterol: 46 mg/dL     Total Cholesterol: 177 mg/dL    A/P: Diabetes longstanding, currently above goal based on recent A1c of 10.2%. CGM report shows some improvement but has a very low activity percentage (45%). She does not appear to have any hypoglycemia. She is taking more basal than prescribed and less bolus than prescribed. I have instructed her to continue Lantus  58 units daily and increase Humalog  to 18 units TID before meals. Will decrease basal insulin  to prevent overnight hypoglycemia and increase meal time insulin  to help with daytime hyperglycemia.  -Continue  Lantus  to 58 units units every morning.  -Increase Humalog  to 18 units three times a day BEFORE meals. -Freestyle Libre 3 + sensors sent.  -Patient educated on purpose, proper use, and potential adverse effects of insulin .  -Extensively discussed pathophysiology of diabetes, recommended lifestyle interventions, dietary effects on blood sugar control.  -Counseled on s/sx of and management of hypoglycemia.  -Next A1c anticipated 03/08/23  URI symptoms: collaborated with triage. Rapid strep test  negative. Denies any recent flu or covid contacts. She is afebrile and has a hx of allergies.  -Rxn given for PO cetirizine  and intranasal fluticasone .  -Advised patient to contact us  if symptoms worsen over the next week or if new symptoms present.   Written patient instructions provided. Patient verbalized understanding of treatment plan.  Total time in face to face counseling 30 minutes.    Follow-up:  Pharmacist: in 1 month.  Marene Shape, PharmD, Becky Bowels, CPP Clinical Pharmacist [facility] [phone]

## 2023-06-02 NOTE — Telephone Encounter (Signed)
 Patient is requesting Libre 3 + sensors as her pharmacy is phasing out the [place] 3 sensors. I sent the 3 + to her pharmacy today. Do we need to complete a PA for this? She is on multiple insulin  injections a day.

## 2023-06-03 ENCOUNTER — Telehealth: Payer: Self-pay

## 2023-06-03 NOTE — Telephone Encounter (Signed)
 Patient has voiced that her throat still hurts and she has been coughing Patient scheduled for VV on 06/06/2023.

## 2023-06-06 ENCOUNTER — Ambulatory Visit: Attending: Internal Medicine | Admitting: Internal Medicine

## 2023-06-06 ENCOUNTER — Encounter: Payer: Self-pay | Admitting: Internal Medicine

## 2023-06-06 DIAGNOSIS — J40 Bronchitis, not specified as acute or chronic: Secondary | ICD-10-CM

## 2023-06-06 DIAGNOSIS — J45901 Unspecified asthma with (acute) exacerbation: Secondary | ICD-10-CM

## 2023-06-06 MED ORDER — BENZONATATE 100 MG PO CAPS: 100.0000 mg | ORAL_CAPSULE | Freq: Three times a day (TID) | ORAL | 0 refills | Status: DC | PRN

## 2023-06-06 MED ORDER — PREDNISONE 20 MG PO TABS: 10.0000 mg | ORAL_TABLET | Freq: Every day | ORAL | 0 refills | Status: DC

## 2023-06-06 MED ORDER — AZITHROMYCIN 250 MG PO TABS: ORAL_TABLET | ORAL | 0 refills | Status: DC

## 2023-06-06 NOTE — Progress Notes (Signed)
 Patient ID: Laurie Andrews, female   DOB: 1970-02-13, 53 y.o.   MRN: [id number] Virtual Visit via Video Note  I connected with Laurie Andrews on 06/06/2023 at 8:12 AM by a video enabled telemedicine application and verified that I am speaking with the correct person using two identifiers.  Location: Patient: home Provider: Office   I discussed the limitations of evaluation and management by telemedicine and the availability of in person appointments. The patient expressed understanding and agreed to proceed.  History of Present Illness: Patient with history of DM type II with peripheral neuropathy, HTN, opiate dependence, anxiety disorder, asthma, migraines, OA knees, alopecia.      Discussed the use of AI scribe software for clinical note transcription with the patient, who gave verbal consent to proceed.  History of Present Illness   The patient, with a history of asthma, presents with a week-long history of coughing up green mucus, chest pain with coughing, and shortness of breath. She reports using her ProAir  inhaler more than four times a day, in addition to her regular inhaler twice daily. She also reports having hot and cold chills, suggestive of a fever, though she did not check her temperature.  In addition to her respiratory symptoms, she reports left ear pain, particularly when turning to the left side and swallowing. She denies any ear drainage. She had a negative rapid strep throat test last week and a negative home COVID test on Friday. She reports a sick contact at home, her grandson, who has been coughing a lot. She has tried Mucinex and Robitussin DM for her symptoms, but reports no relief.       Outpatient Encounter Medications as of 06/06/2023  Medication Sig   Accu-Chek Softclix Lancets lancets USE AS INSTRUCTED   albuterol  (PROVENTIL ) (2.5 MG/3ML) 0.083% nebulizer solution INHALE 3 ML BY NEBULIZATION EVERY 6 HOURS AS NEEDED FOR WHEEZING OR SHORTNESS OF BREATH    amitriptyline  (ELAVIL ) 100 MG tablet Take 100 mg by mouth as needed for sleep.   amLODipine  (NORVASC ) 10 MG tablet TAKE 1 TABLET BY MOUTH EVERY DAY   atorvastatin  (LIPITOR) 10 MG tablet TAKE 1 TABLET BY MOUTH EVERY DAY   cetirizine  (ZYRTEC ) 10 MG tablet Take 1 tablet (10 mg total) by mouth daily.   Cholecalciferol (VITAMIN D3) 25 MCG (1000 UT) capsule Take 1 capsule (1,000 Units total) by mouth daily.   Continuous Blood Gluc Receiver (FREESTYLE LIBRE 3 READER) DEVI 1 application  by Does not apply route in the morning, at noon, and at bedtime. Use to check blood sugar TID. E11.65   Continuous Glucose Sensor (FREESTYLE LIBRE 3 PLUS SENSOR) MISC Change sensor every 15 days. Use to check blood sugar continuously.   diclofenac  Sodium (VOLTAREN ) 1 % GEL Apply 2 g topically 4 (four) times daily.   esomeprazole  (NEXIUM ) 20 MG capsule Take 1 capsule (20 mg total) by mouth 2 (two) times daily before a meal. Stop Pantoprazole    fluticasone  (FLONASE ) 50 MCG/ACT nasal spray Place 2 sprays into both nostrils daily.   fluticasone -salmeterol (WIXELA INHUB) 100-50 MCG/ACT AEPB Inhale 1 puff into the lungs 2 (two) times daily.   GAVILAX 17 GM/SCOOP powder Take 17 g by mouth daily as needed for mild constipation.   glucose blood (ACCU-CHEK GUIDE) test strip Use as instructed   insulin  glargine (LANTUS  SOLOSTAR) 100 UNIT/ML Solostar Pen Inject 58 Units into the skin at bedtime.   insulin  lispro (HUMALOG ) 100 UNIT/ML KwikPen Inject 18 Units into the skin 3 (three) times daily.  Insulin  Pen Needle (BD PEN NEEDLE NANO 2ND GEN) 32G X 4 MM MISC Use to inject insulin .   montelukast  (SINGULAIR ) 10 MG tablet TAKE 1 TABLET BY MOUTH EVERY DAY   naloxone  (NARCAN ) nasal spray 4 mg/0.1 mL USE AS DIRECTED   naratriptan  (AMERGE) 2.5 MG tablet TAKE 1 TABLET BY MOUTH AS NEEDED FOR MIGRAINE.   nystatin cream (MYCOSTATIN) Apply 1 application topically 3 (three) times daily as needed for dry skin.   ondansetron  (ZOFRAN ) 4 MG tablet  Take 1 tablet (4 mg total) by mouth every 6 (six) hours.   ondansetron  (ZOFRAN -ODT) 4 MG disintegrating tablet Take 1 tablet (4 mg total) by mouth every 8 (eight) hours as needed for nausea or vomiting.   oxyCODONE  (OXY IR/ROXICODONE ) 5 MG immediate release tablet Take 5 mg by mouth in the morning, at noon, and at bedtime.   PROAIR  HFA 108 (90 Base) MCG/ACT inhaler Inhale 2 puffs into the lungs every 6 (six) hours as needed for wheezing or shortness of breath. Note to pharmacy: Dispense as written.  No substitution allowed.   promethazine -dextromethorphan (PROMETHAZINE -DM) 6.25-15 MG/5ML syrup Take 5 mLs by mouth 4 (four) times daily as needed.   senna-docusate (SENOKOT-S) 8.6-50 MG tablet Take 1 tablet by mouth daily as needed for mild constipation.   topiramate  (TOPAMAX ) 25 MG tablet Take 50 mg by mouth 2 (two) times daily.   triamcinolone  cream (KENALOG ) 0.1 % APPLY TO AFFECTED AREA TWICE A DAY   valsartan  (DIOVAN ) 80 MG tablet Take 1 tablet (80 mg total) by mouth daily. Stop Losartan    No facility-administered encounter medications on file as of 06/06/2023.      Observations/Objective: Older AAF in NAD.  Mild coughing spasms in my presence  Assessment and Plan: 1. Exacerbation of persistent asthma, unspecified asthma severity (Primary) - azithromycin  (ZITHROMAX  Z-PAK) 250 MG tablet; 2 tabs PO x 1 then 1 tab Po daily  Dispense: 6 each; Refill: 0 - predniSONE  (DELTASONE ) 20 MG tablet; Take 0.5 tablets (10 mg total) by mouth daily with breakfast.  Dispense: 5 tablet; Refill: 0 - benzonatate  (TESSALON  PERLES) 100 MG capsule; Take 1 capsule (100 mg total) by mouth 3 (three) times daily as needed.  Dispense: 20 capsule; Refill: 0  2. Bronchitis - azithromycin  (ZITHROMAX  Z-PAK) 250 MG tablet; 2 tabs PO x 1 then 1 tab Po daily  Dispense: 6 each; Refill: 0   Follow Up Instructions: Follow up if no improvement   I discussed the assessment and treatment plan with the patient. The patient was  provided an opportunity to ask questions and all were answered. The patient agreed with the plan and demonstrated an understanding of the instructions.   The patient was advised to call back or seek an in-person evaluation if the symptoms worsen or if the condition fails to improve as anticipated.  I spent 9 minutes dedicated to the care of this patient on the date of this encounter to include previsit review of of chart, face to face time with pt discussing dx and management and post visit entering of orders  This note has been created with Education officer, environmental. Any transcriptional errors are unintentional.  Concetta Dee, MD

## 2023-06-13 ENCOUNTER — Encounter: Payer: Self-pay | Admitting: Internal Medicine

## 2023-06-21 DIAGNOSIS — M25562 Pain in left knee: Secondary | ICD-10-CM | POA: Diagnosis not present

## 2023-06-21 DIAGNOSIS — M1710 Unilateral primary osteoarthritis, unspecified knee: Secondary | ICD-10-CM | POA: Diagnosis not present

## 2023-06-21 DIAGNOSIS — Z79891 Long term (current) use of opiate analgesic: Secondary | ICD-10-CM | POA: Diagnosis not present

## 2023-06-21 DIAGNOSIS — M545 Low back pain, unspecified: Secondary | ICD-10-CM | POA: Diagnosis not present

## 2023-06-21 DIAGNOSIS — G894 Chronic pain syndrome: Secondary | ICD-10-CM | POA: Diagnosis not present

## 2023-06-28 ENCOUNTER — Ambulatory Visit (HOSPITAL_COMMUNITY)
Admission: EM | Admit: 2023-06-28 | Discharge: 2023-06-28 | Disposition: A | Discharge status: Home / Self Care | Attending: Emergency Medicine | Admitting: Emergency Medicine

## 2023-06-28 ENCOUNTER — Encounter (HOSPITAL_COMMUNITY): Payer: Self-pay

## 2023-06-28 ENCOUNTER — Ambulatory Visit (INDEPENDENT_AMBULATORY_CARE_PROVIDER_SITE_OTHER)

## 2023-06-28 DIAGNOSIS — R051 Acute cough: Secondary | ICD-10-CM

## 2023-06-28 DIAGNOSIS — J069 Acute upper respiratory infection, unspecified: Secondary | ICD-10-CM | POA: Diagnosis not present

## 2023-06-28 DIAGNOSIS — I700 Atherosclerosis of aorta: Secondary | ICD-10-CM | POA: Diagnosis not present

## 2023-06-28 DIAGNOSIS — J4521 Mild intermittent asthma with (acute) exacerbation: Secondary | ICD-10-CM

## 2023-06-28 DIAGNOSIS — R0602 Shortness of breath: Secondary | ICD-10-CM | POA: Diagnosis not present

## 2023-06-28 DIAGNOSIS — R059 Cough, unspecified: Secondary | ICD-10-CM | POA: Diagnosis not present

## 2023-06-28 MED ORDER — BENZONATATE 100 MG PO CAPS: 100.0000 mg | ORAL_CAPSULE | Freq: Three times a day (TID) | ORAL | 0 refills | Status: DC

## 2023-06-28 MED ORDER — AZELASTINE HCL 0.1 % NA SOLN: 2.0000 | Freq: Two times a day (BID) | NASAL | 0 refills | Status: DC

## 2023-06-28 MED ORDER — PREDNISONE 10 MG PO TABS
10.0000 mg | ORAL_TABLET | Freq: Every day | ORAL | 0 refills | Status: AC
Start: 2023-06-28 — End: 2023-07-03

## 2023-06-28 NOTE — ED Triage Notes (Signed)
 Patient here today with c/o nasal congestion and drainage, ST, SOB, wheeze, and productive cough X 3-4 days and left ear pain this morning. Patient states that a week ago she had the same symptoms.

## 2023-06-28 NOTE — ED Provider Notes (Signed)
 [facility]    CSN: [id number] Arrival date & time: 06/28/23  1453      History   Chief Complaint Chief Complaint  Patient presents with   Nasal Congestion    HPI Laurie Andrews is a 53 y.o. female.   Patient presents with nasal congestion, runny nose, sore throat, shortness of breath, intermittent wheezing, and productive cough x 3 to 4 days.  Patient also reports left ear pain that began this morning.  Denies fever, but endorses intermittent chills and sweats.  Denies body aches, chest pain, abdominal pain, vomiting, diarrhea.  Patient states that she had similar symptoms last week that resolved on their own.  Patient states that she was also seen on 4/28 for similar symptoms and was given a Z-Pak and prednisone  with relief.  Patient has history of asthma.  Patient states that she has been using her albuterol  inhaler and nebulizer with relief.  Patient also reports taking cetirizine  once daily with minimal relief.  The history is provided by the patient and medical records.    Past Medical History:  Diagnosis Date   Anxiety    Asthma    Diabetes mellitus without complication (HCC)    Hypertension     Patient Active Problem List   Diagnosis Date Noted   Hyperlipidemia 07/25/2021   Influenza vaccine needed 05/08/2020   Obesity (BMI 30.0-34.9) 05/08/2020   Moderate persistent asthma without complication 05/08/2020   Post-traumatic osteoarthritis of both knees 05/08/2020   Acute pyelonephritis 04/09/2020   Constipation 04/09/2020   Type 2 diabetes mellitus with hyperglycemia (HCC) 04/08/2020   Essential hypertension 04/08/2020    Past Surgical History:  Procedure Laterality Date   BREAST SURGERY     CHOLECYSTECTOMY     HIGH RISK BREAST EXCISION     KNEE SURGERY      OB History   No obstetric history on file.      Home Medications    Prior to Admission medications   Medication Sig Start Date End Date Taking? Authorizing Provider  azelastine  (ASTELIN) 0.1 % nasal spray Place 2 sprays into both nostrils 2 (two) times daily. Use in each nostril as directed 06/28/23  Yes Levora Reas A, NP  benzonatate  (TESSALON ) 100 MG capsule Take 1 capsule (100 mg total) by mouth every 8 (eight) hours. 06/28/23  Yes Rosevelt Constable, Samariyah Cowles A, NP  predniSONE  (DELTASONE ) 10 MG tablet Take 1 tablet (10 mg total) by mouth daily for 5 days. 06/28/23 07/03/23 Yes Levora Reas A, NP  Accu-Chek Softclix Lancets lancets USE AS INSTRUCTED 03/21/23   Lawrance Presume, MD  albuterol  (PROVENTIL ) (2.5 MG/3ML) 0.083% nebulizer solution INHALE 3 ML BY NEBULIZATION EVERY 6 HOURS AS NEEDED FOR WHEEZING OR SHORTNESS OF BREATH 02/08/23   Lawrance Presume, MD  amitriptyline  (ELAVIL ) 100 MG tablet Take 100 mg by mouth as needed for sleep. 12/29/18   [provider]  amLODipine  (NORVASC ) 10 MG tablet TAKE 1 TABLET BY MOUTH EVERY DAY 01/03/23   Lawrance Presume, MD  atorvastatin  (LIPITOR) 10 MG tablet TAKE 1 TABLET BY MOUTH EVERY DAY 05/04/23   Lawrance Presume, MD  cetirizine  (ZYRTEC ) 10 MG tablet Take 1 tablet (10 mg total) by mouth daily. 06/02/23   Lawrance Presume, MD  Cholecalciferol (VITAMIN D3) 25 MCG (1000 UT) capsule Take 1 capsule (1,000 Units total) by mouth daily. 04/21/22   Lawrance Presume, MD  Continuous Blood Gluc Receiver (FREESTYLE LIBRE 3 READER) DEVI 1 application  by Does not  apply route in the morning, at noon, and at bedtime. Use to check blood sugar TID. E11.65 04/13/22   Lawrance Presume, MD  Continuous Glucose Sensor (FREESTYLE LIBRE 3 PLUS SENSOR) MISC Change sensor every 15 days. Use to check blood sugar continuously. 06/02/23   Lawrance Presume, MD  diclofenac  Sodium (VOLTAREN ) 1 % GEL Apply 2 g topically 4 (four) times daily. 11/17/21   Darlis Eisenmenger, PA-C  esomeprazole  (NEXIUM ) 20 MG capsule Take 1 capsule (20 mg total) by mouth 2 (two) times daily before a meal. Stop Pantoprazole  04/27/23   Lawrance Presume, MD  fluticasone   (FLONASE ) 50 MCG/ACT nasal spray Place 2 sprays into both nostrils daily. 06/02/23   Lawrance Presume, MD  fluticasone -salmeterol (WIXELA INHUB) 100-50 MCG/ACT AEPB Inhale 1 puff into the lungs 2 (two) times daily. 04/27/23   Lawrance Presume, MD  GAVILAX 17 GM/SCOOP powder Take 17 g by mouth daily as needed for mild constipation. 09/04/19   [provider]  glucose blood (ACCU-CHEK GUIDE) test strip Use as instructed 03/15/22   Lawrance Presume, MD  insulin  glargine (LANTUS  SOLOSTAR) 100 UNIT/ML Solostar Pen Inject 58 Units into the skin at bedtime. 06/02/23   Lawrance Presume, MD  insulin  lispro (HUMALOG ) 100 UNIT/ML KwikPen Inject 18 Units into the skin 3 (three) times daily. 06/02/23   Lawrance Presume, MD  Insulin  Pen Needle (BD PEN NEEDLE NANO 2ND GEN) 32G X 4 MM MISC Use to inject insulin . 02/23/23   Lawrance Presume, MD  montelukast  (SINGULAIR ) 10 MG tablet TAKE 1 TABLET BY MOUTH EVERY DAY 01/03/23   Lawrance Presume, MD  naloxone  (NARCAN ) nasal spray 4 mg/0.1 mL USE AS DIRECTED 03/15/22   Lawrance Presume, MD  naratriptan  (AMERGE) 2.5 MG tablet TAKE 1 TABLET BY MOUTH AS NEEDED FOR MIGRAINE. 10/12/22   Lawrance Presume, MD  nystatin cream (MYCOSTATIN) Apply 1 application topically 3 (three) times daily as needed for dry skin. 01/05/20   [provider]  ondansetron  (ZOFRAN ) 4 MG tablet Take 1 tablet (4 mg total) by mouth every 6 (six) hours. 09/03/22   Adel Aden, PA-C  oxyCODONE  (OXY IR/ROXICODONE ) 5 MG immediate release tablet Take 5 mg by mouth in the morning, at noon, and at bedtime.    [provider]  PROAIR  HFA 108 (90 Base) MCG/ACT inhaler Inhale 2 puffs into the lungs every 6 (six) hours as needed for wheezing or shortness of breath. Note to pharmacy: Dispense as written.  No substitution allowed. 04/28/23   Lawrance Presume, MD  senna-docusate (SENOKOT-S) 8.6-50 MG tablet Take 1 tablet by mouth daily as needed for mild constipation. 04/15/22    Lawrance Presume, MD  topiramate  (TOPAMAX ) 25 MG tablet Take 50 mg by mouth 2 (two) times daily. 03/20/19   [provider]  triamcinolone  cream (KENALOG ) 0.1 % APPLY TO AFFECTED AREA TWICE A DAY 04/27/23   Lawrance Presume, MD  valsartan  (DIOVAN ) 80 MG tablet Take 1 tablet (80 mg total) by mouth daily. Stop Losartan  04/27/23   Lawrance Presume, MD    Family History Family History  Problem Relation Age of Onset   Breast cancer Maternal Aunt     Social History Social History   Tobacco Use   Smoking status: Never   Smokeless tobacco: Never  Vaping Use   Vaping status: Never Used  Substance Use Topics   Alcohol use: Not Currently   Drug use: Never  Allergies   Keflex [cephalexin], Metformin and related, Trulicity [dulaglutide], Aspirin, Morphine and codeine, and Penicillins   Review of Systems Review of Systems  Per HPI  Physical Exam Triage Vital Signs ED Triage Vitals  Encounter Vitals Group     BP 06/28/23 1601 (!) 150/64     Systolic BP Percentile --      Diastolic BP Percentile --      Pulse Rate 06/28/23 1601 83     Resp 06/28/23 1601 16     Temp 06/28/23 1601 98.5 F (36.9 C)     Temp Source 06/28/23 1601 Oral     SpO2 06/28/23 1601 96 %     Weight --      Height --      Head Circumference --      Peak Flow --      Pain Score 06/28/23 1600 9     Pain Loc --      Pain Education --      Exclude from Growth Chart --    No data found.  Updated Vital Signs BP (!) 150/64 (BP Location: Left Arm)   Pulse 83   Temp 98.5 F (36.9 C) (Oral)   Resp 16   SpO2 96%   Visual Acuity Right Eye Distance:   Left Eye Distance:   Bilateral Distance:    Right Eye Near:   Left Eye Near:    Bilateral Near:     Physical Exam Vitals and nursing note reviewed.  Constitutional:      General: She is awake. She is not in acute distress.    Appearance: Normal appearance. She is well-developed and well-groomed. She is not ill-appearing.  HENT:      Right Ear: Tympanic membrane, ear canal and external ear normal.     Left Ear: Tympanic membrane, ear canal and external ear normal.     Nose: Congestion and rhinorrhea present.     Mouth/Throat:     Mouth: Mucous membranes are moist.     Pharynx: Posterior oropharyngeal erythema present. No oropharyngeal exudate.  Cardiovascular:     Rate and Rhythm: Normal rate and regular rhythm.  Pulmonary:     Effort: Pulmonary effort is normal.     Breath sounds: Normal breath sounds.  Skin:    General: Skin is warm and dry.  Neurological:     Mental Status: She is alert.  Psychiatric:        Behavior: Behavior is cooperative.      UC Treatments / Results  Labs (all labs ordered are listed, but only abnormal results are displayed) Labs Reviewed - No data to display  EKG   Radiology DG Chest 2 View Result Date: 06/28/2023 CLINICAL DATA:  Cough and short of breath EXAM: CHEST - 2 VIEW COMPARISON:  09/07/2019 FINDINGS: The heart size and mediastinal contours are within normal limits. Aortic atherosclerosis. Both lungs are clear. The visualized skeletal structures are unremarkable. IMPRESSION: No active cardiopulmonary disease. Electronically Signed   By: Esmeralda Hedge M.D.   On: 06/28/2023 18:25    Procedures Procedures (including critical care time)  Medications Ordered in UC Medications - No data to display  Initial Impression / Assessment and Plan / UC Course  I have reviewed the triage vital signs and the nursing notes.  Pertinent labs & imaging results that were available during my care of the patient were reviewed by me and considered in my medical decision making (see chart for details).     Patient  is well-appearing.  Vitals are stable.  Congestion and rhinorrhea are present, mild erythema and PND noted to pharynx.  Lungs clear bilaterally on auscultation.  Ordered chest x-ray to rule out underlying pneumonia due to recurrent and worsening symptoms.  Based on my  interpretation there is no evidence of pneumonia or underlying infection.  Radiology report confirms this.  Prescribed prednisone  to assist with asthma exacerbation.  Prescribed Tessalon  as needed for cough.  Prescribed azelastine nasal spray to assist with congestion.  Deferred antibiotics at this time due to no evidence of bacterial infection.  Discussed follow-up and return precautions. Final Clinical Impressions(s) / UC Diagnoses   Final diagnoses:  Acute cough  Acute upper respiratory infection  Mild intermittent asthma with acute exacerbation     Discharge Instructions      Based on my interpretation your x-ray is negative for any pneumonia or underlying infection.  If radiology read suggests differently you will receive a phone call to adjust your treatment plan. Start taking 1 tablet of prednisone  once daily for 5 days to help with asthma exacerbation. Take Tessalon  every 8 hours as needed for cough. Use azelastine nasal spray twice daily to help with congestion. Follow-up with primary care provider or return here if your symptoms persist or worsen.   ED Prescriptions     Medication Sig Dispense Auth. Provider   predniSONE  (DELTASONE ) 10 MG tablet Take 1 tablet (10 mg total) by mouth daily for 5 days. 5 tablet Levora Reas A, NP   benzonatate  (TESSALON ) 100 MG capsule Take 1 capsule (100 mg total) by mouth every 8 (eight) hours. 21 capsule Rosevelt Constable, Carless Slatten A, NP   azelastine (ASTELIN) 0.1 % nasal spray Place 2 sprays into both nostrils 2 (two) times daily. Use in each nostril as directed 30 mL Levora Reas A, NP      PDMP not reviewed this encounter.   Levora Reas A, NP 06/28/23 [phone]

## 2023-06-28 NOTE — Discharge Instructions (Signed)
 Based on my interpretation your x-ray is negative for any pneumonia or underlying infection.  If radiology read suggests differently you will receive a phone call to adjust your treatment plan. Start taking 1 tablet of prednisone  once daily for 5 days to help with asthma exacerbation. Take Tessalon  every 8 hours as needed for cough. Use azelastine nasal spray twice daily to help with congestion. Follow-up with primary care provider or return here if your symptoms persist or worsen.

## 2023-07-04 NOTE — Progress Notes (Unsigned)
 S:    PCP: Dr. Lincoln Renshaw  53 y.o. female who presents for diabetes evaluation, education, and management.  PMH is significant for 2DM w/ peripheral neuropathy, HTN, opiate dependence, anxiety disorder, asthma, migraines, OA knees, alopecia. Patient was referred by Primary Care Provider, Dr. Lincoln Renshaw, on 04/27/2023.   Last visit with pharmacist 06/02/2023, patient CGM data showed poor glycemic control and most recent A1c on 04/27/2023 was 10.2%. Patient reported neuropathy and URI symptoms. Increased insulin  Humalog  from 14 to 18 U TID before meals. Prescription for FL3+ was sent to pharmacy.   Today, URI symptoms still present, ear pain resolved, swelling resolved, but still has shortness of breath and coughing. Coughing causes full body pain mostly in the chest, finished 5 day prednisone  taper. Endorses persistent painful thick and green sputum. Patient reports taking Robitussin (sugar free) with no symptom relief.   Patient reports using her FL3+. Patient reports adherence to insulin  regimen and denies any side effects.   Patient reports hypoglycemia with 3 episodes this past week with sweatiness and shakiness. Patient reports BG reading of 53 - treated successfully.    Patient reports occasional nocturia (nighttime urination) once a night.   Patient reports occasional neuropathy (nerve pain) in her feet when she knows her sugars are high and it goes away.  Patient reports visual changes but self resolves when her sugars are normal.  Patient reports self foot exams.  Current diabetes medications include: Lantus  58 units daily in AM, Humalog  18 units TID before meals  Insurance coverage: UHC Medicare + Medicaid   No relevant family hx No smoking hx   Patient reported 2-3 meals/day Breakfast: glucose shake, sometimes skips Lunch: grilled chicken, salad, vegetables Dinner: [place] tacos, [place] spaghetti Snacks: reports usually not snacking, sometimes oranges or grapes Beverages: mostly  water, coke zero  Exercise: walks around complex (15-20 mins) 3 times a week; starting physical therapy   O:  Date of Download: 07/05/2023  % Time CGM is active: 89% Average Glucose: 215 mg/dL Glucose Management Indicator: 8.5  Glucose Variability: 40.5 (goal <36%) Time in Goal:  - Time in range 70-180: 40% - Time above range: 59% - Time below range: 1% Observed patterns:    Lab Results  Component Value Date   HGBA1C 10.2 (A) 04/27/2023   There were no vitals filed for this visit.  Lipid Panel     Component Value Date/Time   CHOL 177 07/24/2021 1501   TRIG 259 (H) 07/24/2021 1501   HDL 46 07/24/2021 1501   CHOLHDL 3.8 07/24/2021 1501   LDLCALC 88 07/24/2021 1501    Clinical Atherosclerotic Cardiovascular Disease (ASCVD): No  The 10-year ASCVD risk score (Arnett DK, et al., 2019) is: 20.1%   Values used to calculate the score:     Age: 19 years     Sex: Female     Is Non-Hispanic African American: Yes     Diabetic: Yes     Tobacco smoker: No     Systolic Blood Pressure: 150 mmHg     Is BP treated: Yes     HDL Cholesterol: 46 mg/dL     Total Cholesterol: 177 mg/dL   A/P: Diabetes longstanding, currently above goal based on recent A1c of 10.2%. CGM report shows 8.5%. Patient reports hypoglycemia confirmed with CGM data mostly occurring during mealtimes. Adherence to insulin  regimen optimal. She has ongoing URI symptoms and prednisone  use this past month that may be contributing to her high BG. Given recent CGM data, optimizing insulin   regimen to improve glycemic control with basal insulin .   - Increase Lantus  to 64 units every morning.  - Continue Humalog  18 units three times a day BEFORE meals. Skip if not eating.  -Patient educated on purpose, proper use, and potential adverse effects of insulin .  -Extensively discussed pathophysiology of diabetes, recommended lifestyle interventions, dietary effects on blood sugar control.  -Counseled on s/sx of and management of  hypoglycemia.  -Next A1c anticipated 07/2023  URI symptoms: Patient endorses that Zachery Hermes is not working and patient has persistent URI symptoms with only mild symptom relief and control. She recalls previously being on Breo Ellipta that helped, and has been tapered off of prednisone . She does not have a smoking history and her URI symptoms may be related to asthma exacerbations.  - pulmonary referral due to pt having persistent URI symptoms, hx of asthma - Message with PCP about cough medicine for symptom management - Rx Breo Ellipta sent for 100-25 mcg; 1 inhalation daily. Stop Wixela d/t it being ineffective.  Written patient instructions provided. Patient verbalized understanding of treatment plan.  Total time in face to face counseling 30 minutes.    Follow-up:  Pharmacist: 09/05/2023. PCP: 07/28/2023  Georges Kings M.S. PharmD Candidate Class of 2026 [employer]   Marene Shape, PharmD, [place], CPP Clinical Pharmacist [facility] [phone]

## 2023-07-05 ENCOUNTER — Ambulatory Visit: Attending: Family Medicine | Admitting: Pharmacist

## 2023-07-05 ENCOUNTER — Encounter: Payer: Self-pay | Admitting: Pharmacist

## 2023-07-05 ENCOUNTER — Other Ambulatory Visit: Payer: Self-pay

## 2023-07-05 ENCOUNTER — Encounter: Payer: Self-pay | Admitting: Internal Medicine

## 2023-07-05 DIAGNOSIS — E1165 Type 2 diabetes mellitus with hyperglycemia: Secondary | ICD-10-CM

## 2023-07-05 DIAGNOSIS — Z794 Long term (current) use of insulin: Secondary | ICD-10-CM

## 2023-07-05 DIAGNOSIS — J4540 Moderate persistent asthma, uncomplicated: Secondary | ICD-10-CM | POA: Diagnosis not present

## 2023-07-05 MED ORDER — FLUTICASONE FUROATE-VILANTEROL 100-25 MCG/ACT IN AEPB
1.0000 | INHALATION_SPRAY | Freq: Every day | RESPIRATORY_TRACT | 11 refills | Status: DC
Start: 2023-07-05 — End: 2023-07-05
  Filled 2023-07-05: qty 60, 30d supply, fill #0

## 2023-07-05 MED ORDER — BREO ELLIPTA 100-25 MCG/ACT IN AEPB
1.0000 | INHALATION_SPRAY | Freq: Every day | RESPIRATORY_TRACT | 11 refills | Status: DC
Start: 2023-07-05 — End: 2023-09-05
  Filled 2023-07-05: qty 60, 30d supply, fill #0

## 2023-07-22 DIAGNOSIS — M25562 Pain in left knee: Secondary | ICD-10-CM | POA: Diagnosis not present

## 2023-07-22 DIAGNOSIS — M25561 Pain in right knee: Secondary | ICD-10-CM | POA: Diagnosis not present

## 2023-07-22 DIAGNOSIS — M1710 Unilateral primary osteoarthritis, unspecified knee: Secondary | ICD-10-CM | POA: Diagnosis not present

## 2023-07-22 DIAGNOSIS — M545 Low back pain, unspecified: Secondary | ICD-10-CM | POA: Diagnosis not present

## 2023-07-22 DIAGNOSIS — G894 Chronic pain syndrome: Secondary | ICD-10-CM | POA: Diagnosis not present

## 2023-07-22 DIAGNOSIS — Z79891 Long term (current) use of opiate analgesic: Secondary | ICD-10-CM | POA: Diagnosis not present

## 2023-07-27 ENCOUNTER — Other Ambulatory Visit: Payer: Self-pay | Admitting: Internal Medicine

## 2023-07-27 DIAGNOSIS — E1165 Type 2 diabetes mellitus with hyperglycemia: Secondary | ICD-10-CM

## 2023-07-28 ENCOUNTER — Other Ambulatory Visit: Payer: Self-pay

## 2023-07-28 ENCOUNTER — Ambulatory Visit: Payer: Self-pay | Attending: Internal Medicine | Admitting: Internal Medicine

## 2023-07-28 VITALS — BP 159/71 | HR 83 | Temp 98.1°F | Ht 61.0 in | Wt 200.0 lb

## 2023-07-28 DIAGNOSIS — E785 Hyperlipidemia, unspecified: Secondary | ICD-10-CM | POA: Diagnosis not present

## 2023-07-28 DIAGNOSIS — D649 Anemia, unspecified: Secondary | ICD-10-CM

## 2023-07-28 DIAGNOSIS — I152 Hypertension secondary to endocrine disorders: Secondary | ICD-10-CM

## 2023-07-28 DIAGNOSIS — E1165 Type 2 diabetes mellitus with hyperglycemia: Secondary | ICD-10-CM | POA: Diagnosis not present

## 2023-07-28 DIAGNOSIS — Z794 Long term (current) use of insulin: Secondary | ICD-10-CM

## 2023-07-28 DIAGNOSIS — E1159 Type 2 diabetes mellitus with other circulatory complications: Secondary | ICD-10-CM

## 2023-07-28 DIAGNOSIS — M172 Bilateral post-traumatic osteoarthritis of knee: Secondary | ICD-10-CM | POA: Diagnosis not present

## 2023-07-28 DIAGNOSIS — E1169 Type 2 diabetes mellitus with other specified complication: Secondary | ICD-10-CM

## 2023-07-28 DIAGNOSIS — R600 Localized edema: Secondary | ICD-10-CM | POA: Diagnosis not present

## 2023-07-28 DIAGNOSIS — I10 Essential (primary) hypertension: Secondary | ICD-10-CM

## 2023-07-28 LAB — GLUCOSE, POCT (MANUAL RESULT ENTRY): POC Glucose: 240 mg/dL — AB (ref 70–99)

## 2023-07-28 LAB — POCT GLYCOSYLATED HEMOGLOBIN (HGB A1C): HbA1c, POC (controlled diabetic range): 9.5 % — AB (ref 0.0–7.0)

## 2023-07-28 MED ORDER — AMLODIPINE BESYLATE 5 MG PO TABS: 5.0000 mg | ORAL_TABLET | Freq: Every day | ORAL | 1 refills | Status: DC

## 2023-07-28 MED ORDER — VALSARTAN 160 MG PO TABS
160.0000 mg | ORAL_TABLET | Freq: Every day | ORAL | 1 refills | Status: DC
Start: 2023-07-28 — End: 2023-08-02

## 2023-07-28 MED ORDER — FUROSEMIDE 20 MG PO TABS: 20.0000 mg | ORAL_TABLET | Freq: Every day | ORAL | 3 refills | Status: DC | PRN

## 2023-07-28 MED ORDER — INSULIN LISPRO (1 UNIT DIAL) 100 UNIT/ML (KWIKPEN): 22.0000 [IU] | PEN_INJECTOR | Freq: Three times a day (TID) | SUBCUTANEOUS | 5 refills | Status: DC

## 2023-07-28 MED ORDER — LANTUS SOLOSTAR 100 UNIT/ML ~~LOC~~ SOPN
74.0000 [IU] | PEN_INJECTOR | Freq: Every day | SUBCUTANEOUS | 6 refills | Status: DC
Start: 2023-07-28 — End: 2023-09-05

## 2023-07-28 MED ORDER — BD PEN NEEDLE NANO 2ND GEN 32G X 4 MM MISC: 6 refills | Status: DC

## 2023-07-28 NOTE — Progress Notes (Addendum)
 Patient ID: Laurie Andrews, female    DOB: February 02, 1971  MRN: [id number]  CC: Diabetes (DM f/u. Marikay of bilateral legs, ankles, knees - painful/Discuss amitriptyline  /Requesting lancet change & greater amount )   Subjective: Laurie Andrews is a 53 y.o. female who presents for chronic ds management. Her concerns today include:  Patient with history of DM type II with peripheral neuropathy, HTN, opiate dependence, anxiety disorder, asthma, migraines, OA knees, alopecia.      Discussed the use of AI scribe software for clinical note transcription with the patient, who gave verbal consent to proceed.  History of Present Illness Laurie Andrews is a 53 year old female with diabetes and hypertension who presents for diabetes management and blood pressure evaluation.  DM: Results for orders placed or performed in visit on 07/28/23  POCT glucose (manual entry)   Collection Time: 07/28/23 10:52 AM  Result Value Ref Range   POC Glucose 240 (A) 70 - 99 mg/dl  POCT glycosylated hemoglobin (Hb A1C)   Collection Time: 07/28/23 11:07 AM  Result Value Ref Range   Hemoglobin A1C     HbA1c POC (<> result, manual entry)     HbA1c, POC (prediabetic range)     HbA1c, POC (controlled diabetic range) 9.5 (A) 0.0 - 7.0 %    Her current A1c is 9.5, improved from 10.2 four months ago. She reports taking  Lantus  68 units daily and Humalog  18 units three times a day with meals. Her continuous glucose monitor shows mainly high blood sugar levels, with 24% in the target range, 1% low and 75% highs; similar percentages for the past 30 days.. She experiences inconsistent eating habits, often not finishing meals, and avoids sugary drinks and junk food. She consumes more fluids than food and prefers [place] over red meat, incorporating fresh fruits and vegetables into her diet.  HTN: She is on valsartan  80 mg and amlodipine  10 mg daily for hypertension, both taken this morning. She does not regularly check  her blood pressure at home but has a cuff available. She limits her salt intake.  She experiences painful knees and swelling in her ankles and feet, which she manages by keeping them elevated. She has arthritis in her knees and has been advised that knee replacement surgery may be needed in the future.  Knees have been swelling and more bothersome recently. She is on oxycodone  through pain management.  HM: She did receive the Cologuard kit but needs help understanding the instructions.  She is agreeable for me to schedule an appointment for her to bring the kit in and meet with our RN next week.  Due for Pap smear.  Eye exam scheduled for later this month.    Patient Active Problem List   Diagnosis Date Noted   Hyperlipidemia 07/25/2021   Influenza vaccine needed 05/08/2020   Obesity (BMI 30.0-34.9) 05/08/2020   Moderate persistent asthma without complication 05/08/2020   Post-traumatic osteoarthritis of both knees 05/08/2020   Acute pyelonephritis 04/09/2020   Constipation 04/09/2020   Type 2 diabetes mellitus with hyperglycemia (HCC) 04/08/2020   Essential hypertension 04/08/2020     Current Outpatient Medications on File Prior to Visit  Medication Sig Dispense Refill   Accu-Chek Softclix Lancets lancets USE AS INSTRUCTED 300 each 2   albuterol  (PROVENTIL ) (2.5 MG/3ML) 0.083% nebulizer solution INHALE 3 ML BY NEBULIZATION EVERY 6 HOURS AS NEEDED FOR WHEEZING OR SHORTNESS OF BREATH 150 mL 0   atorvastatin  (LIPITOR) 10 MG tablet TAKE 1 TABLET BY  MOUTH EVERY DAY 90 tablet 1   azelastine  (ASTELIN ) 0.1 % nasal spray Place 2 sprays into both nostrils 2 (two) times daily. Use in each nostril as directed 30 mL 0   BREO ELLIPTA  100-25 MCG/ACT AEPB Inhale 1 puff into the lungs daily. 60 each 11   cetirizine  (ZYRTEC ) 10 MG tablet Take 1 tablet (10 mg total) by mouth daily. 30 tablet 11   Cholecalciferol (VITAMIN D3) 25 MCG (1000 UT) capsule Take 1 capsule (1,000 Units total) by mouth daily. 100  capsule 1   Continuous Blood Gluc Receiver (FREESTYLE LIBRE 3 READER) DEVI 1 application  by Does not apply route in the morning, at noon, and at bedtime. Use to check blood sugar TID. E11.65 1 each 0   Continuous Glucose Sensor (FREESTYLE LIBRE 3 PLUS SENSOR) MISC USE TO CHECK BLOOD SUGAR THREE TIMES A DAY 2 each 3   diclofenac  Sodium (VOLTAREN ) 1 % GEL Apply 2 g topically 4 (four) times daily. 50 g 0   esomeprazole  (NEXIUM ) 20 MG capsule Take 1 capsule (20 mg total) by mouth 2 (two) times daily before a meal. Stop Pantoprazole  60 capsule 6   GAVILAX 17 GM/SCOOP powder Take 17 g by mouth daily as needed for mild constipation.     glucose blood (ACCU-CHEK GUIDE) test strip Use as instructed 100 each 12   montelukast  (SINGULAIR ) 10 MG tablet TAKE 1 TABLET BY MOUTH EVERY DAY 90 tablet 0   naloxone  (NARCAN ) nasal spray 4 mg/0.1 mL USE AS DIRECTED 2 each 0   naratriptan  (AMERGE) 2.5 MG tablet TAKE 1 TABLET BY MOUTH AS NEEDED FOR MIGRAINE. 10 tablet 1   nystatin cream (MYCOSTATIN) Apply 1 application topically 3 (three) times daily as needed for dry skin.     oxyCODONE  (OXY IR/ROXICODONE ) 5 MG immediate release tablet Take 5 mg by mouth in the morning, at noon, and at bedtime.     PROAIR  HFA 108 (90 Base) MCG/ACT inhaler Inhale 2 puffs into the lungs every 6 (six) hours as needed for wheezing or shortness of breath. Note to pharmacy: Dispense as written.  No substitution allowed. 8.5 g 6   topiramate  (TOPAMAX ) 25 MG tablet Take 50 mg by mouth 2 (two) times daily.     triamcinolone  cream (KENALOG ) 0.1 % APPLY TO AFFECTED AREA TWICE A DAY 30 g 0   amitriptyline  (ELAVIL ) 100 MG tablet Take 100 mg by mouth as needed for sleep. (Patient not taking: Reported on 07/28/2023)     fluticasone  (FLONASE ) 50 MCG/ACT nasal spray Place 2 sprays into both nostrils daily. (Patient not taking: Reported on 07/28/2023) 16 g 6   No current facility-administered medications on file prior to visit.    Allergies  Allergen  Reactions   Keflex [Cephalexin] Nausea And Vomiting   Metformin And Related    Trulicity [Dulaglutide] Nausea And Vomiting   Aspirin Nausea And Vomiting and Rash   Morphine And Codeine Itching and Rash   Penicillins Nausea And Vomiting and Rash    Social History   Socioeconomic History   Marital status: Single    Spouse name: Not on file   Number of children: Not on file   Years of education: Not on file   Highest education level: 12th grade  Occupational History   Not on file  Tobacco Use   Smoking status: Never   Smokeless tobacco: Never  Vaping Use   Vaping status: Never Used  Substance and Sexual Activity   Alcohol use: Not Currently  Drug use: Never   Sexual activity: Not on file  Other Topics Concern   Not on file  Social History Narrative   Not on file   Social Drivers of Health   Financial Resource Strain: Low Risk  (07/28/2023)   Overall Financial Resource Strain (CARDIA)    Difficulty of Paying Living Expenses: Not hard at all  Food Insecurity: No Food Insecurity (07/04/2023)   Hunger Vital Sign    Worried About Running Out of Food in the Last Year: Never true    Ran Out of Food in the Last Year: Never true  Transportation Needs: No Transportation Needs (07/28/2023)   PRAPARE - Administrator, Civil Service (Medical): No    Lack of Transportation (Non-Medical): No  Physical Activity: Insufficiently Active (03/29/2023)   Exercise Vital Sign    Days of Exercise per Week: 3 days    Minutes of Exercise per Session: 30 min  Stress: No Stress Concern Present (07/28/2023)   [employer] of Occupational Health - Occupational Stress Questionnaire    Feeling of Stress: Not at all  Social Connections: Unknown (07/28/2023)   Social Connection and Isolation Panel    Frequency of Communication with Friends and Family: Once a week    Frequency of Social Gatherings with Friends and Family: Not on file    Attends Religious Services: Patient declined     Active Member of Clubs or Organizations: No    Attends Engineer, structural: Not on file    Marital Status: Never married  Recent Concern: Social Connections - Socially Isolated (07/04/2023)   Social Connection and Isolation Panel    Frequency of Communication with Friends and Family: Never    Frequency of Social Gatherings with Friends and Family: Once a week    Attends Religious Services: Never    Database administrator or Organizations: No    Attends Banker Meetings: Never    Marital Status: Never married  Intimate Partner Violence: Not At Risk (03/29/2023)   Humiliation, Afraid, Rape, and Kick questionnaire    Fear of Current or Ex-Partner: No    Emotionally Abused: No    Physically Abused: No    Sexually Abused: No    Family History  Problem Relation Age of Onset   Breast cancer Maternal Aunt     Past Surgical History:  Procedure Laterality Date   BREAST SURGERY     CHOLECYSTECTOMY     HIGH RISK BREAST EXCISION     KNEE SURGERY      ROS: Review of Systems Negative except as stated above  PHYSICAL EXAM: BP (!) 159/71 (BP Location: Left Arm, Patient Position: Sitting, Cuff Size: Large)   Pulse 83   Temp 98.1 F (36.7 C) (Oral)   Ht 5' 1 (1.549 m)   Wt 200 lb (90.7 kg)   SpO2 100%   BMI 37.79 kg/m   Physical Exam  General appearance - alert, well appearing, and in no distress Mental status - normal mood, behavior, speech, dress, motor activity, and thought processes Chest - clear to auscultation, no wheezes, rales or rhonchi, symmetric air entry Heart - normal rate, regular rhythm, normal S1, S2, no murmurs, rubs, clicks or gallops Musculoskeletal -knees: Gait is slow.  She is ambulating without any assistive device.  Moderate discomfort with attempted passive range of motion of the left knee.  Mild discomfort with passive range of motion of the right with some crepitus felt and heard Extremities -trace to 1+  edema in the lower 1/3 of both  legs      Latest Ref Rng & Units 04/27/2023    3:44 PM 09/03/2022    8:47 PM 07/24/2021    3:01 PM  CMP  Glucose 70 - 99 mg/dL 769  617  658   BUN 6 - 24 mg/dL 9  10  11    Creatinine 0.57 - 1.00 mg/dL 9.24  9.12  9.21   Sodium 134 - 144 mmol/L 142  132  134   Potassium 3.5 - 5.2 mmol/L 4.3  4.3  4.7   Chloride 96 - 106 mmol/L 105  99  97   CO2 20 - 29 mmol/L 22  23  24    Calcium  8.7 - 10.2 mg/dL 9.4  9.0  9.8   Total Protein 6.0 - 8.5 g/dL 6.9  7.1  7.7   Total Bilirubin 0.0 - 1.2 mg/dL 0.4  0.4  0.6   Alkaline Phos 44 - 121 IU/L 172  132  188   AST 0 - 40 IU/L 27  16  15    ALT 0 - 32 IU/L 42  20  26    Lipid Panel     Component Value Date/Time   CHOL 177 07/24/2021 1501   TRIG 259 (H) 07/24/2021 1501   HDL 46 07/24/2021 1501   CHOLHDL 3.8 07/24/2021 1501   LDLCALC 88 07/24/2021 1501    CBC    Component Value Date/Time   WBC 7.3 09/03/2022 2047   RBC 4.82 09/03/2022 2047   HGB 12.4 09/03/2022 2047   HGB 13.7 07/24/2021 1501   HCT 39.4 09/03/2022 2047   HCT 42.6 07/24/2021 1501   PLT 337 09/03/2022 2047   PLT 356 07/24/2021 1501   MCV 81.7 09/03/2022 2047   MCV 79 07/24/2021 1501   MCH 25.7 (L) 09/03/2022 2047   MCHC 31.5 09/03/2022 2047   RDW 12.4 09/03/2022 2047   RDW 12.0 07/24/2021 1501   LYMPHSABS 2.5 09/03/2022 2047   MONOABS 0.6 09/03/2022 2047   EOSABS 0.1 09/03/2022 2047   BASOSABS 0.0 09/03/2022 2047    ASSESSMENT AND PLAN: 1. Type 2 diabetes mellitus with hyperglycemia, with long-term current use of insulin  (HCC) (Primary) A1c decreased to 9.5 but remains above target.  Advised patient that our goal is to get blood sugars within target range at least 70% of the times.  Glucose levels variable with hyperglycemia predominant.  Did not tolerate metformin and Trulicity in the past.  - Increase Lantus  insulin  to 74 units daily. - Increase Humalog  insulin  to 22 units with meals, three times a day. - Refer to endocrinology for further diabetes  management. - Encourage healthy eating habits, including fresh fruits and vegetables. - POCT glycosylated hemoglobin (Hb A1C) - POCT glucose (manual entry) - insulin  glargine (LANTUS  SOLOSTAR) 100 UNIT/ML Solostar Pen; Inject 74 Units into the skin at bedtime.  Dispense: 15 mL; Refill: 6 - insulin  lispro (HUMALOG ) 100 UNIT/ML KwikPen; Inject 22 Units into the skin 3 (three) times daily.  Dispense: 45 mL; Refill: 5 - Ambulatory referral to Endocrinology - Insulin  Pen Needle (BD PEN NEEDLE NANO 2ND GEN) 32G X 4 MM MISC; Use to inject insulin .  Dispense: 300 each; Refill: 6 - CBC  2. Hypertension associated with diabetes (HCC) Not at goal.  She is having some lower extremity edema most likely from amlodipine .  Recommend decrease amlodipine  to 5 mg daily and increase the valsartan  to 160 mg daily. I have prescribed some furosemide  for her  to use as needed.  Will check BNP. - valsartan  (DIOVAN ) 160 MG tablet; Take 1 tablet (160 mg total) by mouth daily. Stop Losartan   Dispense: 90 tablet; Refill: 1 - amLODipine  (NORVASC ) 5 MG tablet; Take 1 tablet (5 mg total) by mouth daily.  Dispense: 90 tablet; Refill: 1  3. Hyperlipidemia associated with type 2 diabetes mellitus (HCC) Continue atorvastatin  as prescribed. - Lipid panel  4. Post-traumatic osteoarthritis of both knees Plugged in with pain management.  Reports increased pain and swelling in the knees recently left greater than right.  Will get her back in with orthopedics. - AMB referral to orthopedics  5. Edema of both legs See #2 above - furosemide  (LASIX ) 20 MG tablet; Take 1 tablet (20 mg total) by mouth daily as needed for edema.  Dispense: 30 tablet; Refill: 3 - Brain natriuretic peptide  Addendum 07/30/2023: Patient has new normocytic anemia.  Iron studies will be added to labs.  Patient was given the opportunity to ask questions.  Patient verbalized understanding of the plan and was able to repeat key elements of the plan.   This  documentation was completed using Paediatric nurse.  Any transcriptional errors are unintentional.  Orders Placed This Encounter  Procedures   CBC   Lipid panel   Brain natriuretic peptide   Ambulatory referral to Endocrinology   AMB referral to orthopedics   POCT glycosylated hemoglobin (Hb A1C)   POCT glucose (manual entry)     Requested Prescriptions   Signed Prescriptions Disp Refills   insulin  glargine (LANTUS  SOLOSTAR) 100 UNIT/ML Solostar Pen 15 mL 6    Sig: Inject 74 Units into the skin at bedtime.   insulin  lispro (HUMALOG ) 100 UNIT/ML KwikPen 45 mL 5    Sig: Inject 22 Units into the skin 3 (three) times daily.   Insulin  Pen Needle (BD PEN NEEDLE NANO 2ND GEN) 32G X 4 MM MISC 300 each 6    Sig: Use to inject insulin .   valsartan  (DIOVAN ) 160 MG tablet 90 tablet 1    Sig: Take 1 tablet (160 mg total) by mouth daily. Stop Losartan    amLODipine  (NORVASC ) 5 MG tablet 90 tablet 1    Sig: Take 1 tablet (5 mg total) by mouth daily.   furosemide  (LASIX ) 20 MG tablet 30 tablet 3    Sig: Take 1 tablet (20 mg total) by mouth daily as needed for edema.    Return in about 6 weeks (around 09/08/2023) for Give appt next week with RN to show use of cologuard, PAP.  Barnie Louder, MD, FACP

## 2023-07-28 NOTE — Patient Instructions (Signed)
 VISIT SUMMARY:  Today, you were seen for diabetes management and blood pressure evaluation. We discussed your current medications, blood sugar levels, and blood pressure readings. We also addressed your knee pain and swelling in your ankles and feet. Additionally, we reviewed your general health maintenance and scheduled necessary follow-ups.  YOUR PLAN:  -TYPE 2 DIABETES MELLITUS: Type 2 Diabetes Mellitus is a condition where your body does not use insulin  properly, leading to high blood sugar levels. Your A1c has improved to 9.5 but is still above the target. We have adjusted your insulin  doses: increase Lantus  to 74 units daily and Humalog  to 22 units with meals, three times a day. You are also referred to an endocrinologist for further management. Continue with healthy eating habits, including fresh fruits and vegetables.  -HYPERTENSION: Hypertension is high blood pressure, which can lead to serious health issues if not managed. Your blood pressure is elevated, so we have adjusted your medications: decrease amlodipine  to 5 mg daily and increase valsartan  to 160 mg daily. We also prescribed furosemide 20 mg as needed for swelling. Please check your blood pressure at home weekly.  -PERIPHERAL EDEMA: Peripheral Edema is swelling in the lower legs and feet, often due to fluid retention. This may be related to your amlodipine . We have decreased your amlodipine  dose and prescribed furosemide 20 mg as needed to help with the swelling.  -OSTEOARTHRITIS OF THE KNEES: Osteoarthritis is a condition that causes pain and swelling in the joints due to wear and tear. You have been experiencing knee pain and swelling, and an orthopedic consultation has advised knee replacement surgery once your A1c improves. We will refer you back to Dr. Domingo Friend for further evaluation and management.  -GENERAL HEALTH MAINTENANCE: We reviewed your general health maintenance needs. You are due for a Pap smear in six weeks, and we will  schedule an appointment with the RN next week to demonstrate how to use the Cologuard kit. Your eye exam is scheduled for June 30th with Dr. Elijah Guadalajara.  INSTRUCTIONS:  Please follow up in six weeks for your Pap smear. Schedule lab tests today for blood count, cholesterol, and BNP. We will also submit referrals to endocrinology and orthopedics (Dr. Domingo Friend).

## 2023-07-30 ENCOUNTER — Ambulatory Visit: Payer: Self-pay | Admitting: Internal Medicine

## 2023-07-30 LAB — CBC
Hematocrit: 35.9 % (ref 34.0–46.6)
Hemoglobin: 11.2 g/dL (ref 11.1–15.9)
MCH: 25.4 pg — ABNORMAL LOW (ref 26.6–33.0)
MCHC: 31.2 g/dL — ABNORMAL LOW (ref 31.5–35.7)
MCV: 81 fL (ref 79–97)
Platelets: 350 10*3/uL (ref 150–450)
RBC: 4.41 x10E6/uL (ref 3.77–5.28)
RDW: 13.2 % (ref 11.7–15.4)
WBC: 6 10*3/uL (ref 3.4–10.8)

## 2023-07-30 LAB — LIPID PANEL
Chol/HDL Ratio: 1.4 ratio (ref 0.0–4.4)
Cholesterol, Total: 128 mg/dL (ref 100–199)
HDL: 91 mg/dL (ref >39)
LDL Chol Calc (NIH): 14 mg/dL (ref 0–99)
Triglycerides: 142 mg/dL (ref 0–149)
VLDL Cholesterol Cal: 23 mg/dL (ref 5–40)

## 2023-07-30 LAB — BRAIN NATRIURETIC PEPTIDE: BNP: 25.3 pg/mL (ref 0.0–100.0)

## 2023-07-30 NOTE — Addendum Note (Signed)
 Addended by: VICCI SOBER B on: 07/30/2023 07:40 AM   Modules accepted: Orders

## 2023-08-02 ENCOUNTER — Other Ambulatory Visit: Payer: Self-pay | Admitting: Internal Medicine

## 2023-08-02 DIAGNOSIS — I152 Hypertension secondary to endocrine disorders: Secondary | ICD-10-CM

## 2023-08-02 LAB — SPECIMEN STATUS REPORT

## 2023-08-02 LAB — IRON,TIBC AND FERRITIN PANEL
Ferritin: 332 ng/mL — ABNORMAL HIGH (ref 15–150)
Iron Saturation: 20 % (ref 15–55)
Iron: 52 ug/dL (ref 27–159)
Total Iron Binding Capacity: 257 ug/dL (ref 250–450)
UIBC: 205 ug/dL (ref 131–425)

## 2023-08-02 MED ORDER — VALSARTAN 80 MG PO TABS: 80.0000 mg | ORAL_TABLET | Freq: Every day | ORAL | 1 refills | Status: DC

## 2023-08-02 MED ORDER — HYDRALAZINE HCL 10 MG PO TABS: 10.0000 mg | ORAL_TABLET | Freq: Two times a day (BID) | ORAL | 1 refills | Status: DC

## 2023-08-03 ENCOUNTER — Ambulatory Visit: Admitting: Orthopaedic Surgery

## 2023-08-11 ENCOUNTER — Ambulatory Visit: Admitting: Orthopaedic Surgery

## 2023-08-26 ENCOUNTER — Other Ambulatory Visit (INDEPENDENT_AMBULATORY_CARE_PROVIDER_SITE_OTHER): Payer: Self-pay

## 2023-08-26 ENCOUNTER — Encounter: Payer: Self-pay | Admitting: Orthopaedic Surgery

## 2023-08-26 ENCOUNTER — Ambulatory Visit: Admitting: Orthopaedic Surgery

## 2023-08-26 DIAGNOSIS — M1712 Unilateral primary osteoarthritis, left knee: Secondary | ICD-10-CM | POA: Diagnosis not present

## 2023-08-26 DIAGNOSIS — M170 Bilateral primary osteoarthritis of knee: Secondary | ICD-10-CM

## 2023-08-26 DIAGNOSIS — M1711 Unilateral primary osteoarthritis, right knee: Secondary | ICD-10-CM

## 2023-08-26 DIAGNOSIS — G894 Chronic pain syndrome: Secondary | ICD-10-CM | POA: Diagnosis not present

## 2023-08-26 NOTE — Progress Notes (Signed)
 Office Visit Note   Patient: Laurie Andrews           Date of Birth: [birth year]           MRN: [id number] Visit Date: 08/26/2023              Requested by: Vicci Barnie NOVAK, MD [address] [place],  [place] [address] PCP: Vicci Barnie NOVAK, MD   Assessment & Plan: Visit Diagnoses:  1. Primary osteoarthritis of left knee   2. Primary osteoarthritis of right knee     Plan: History of Present Illness Laurie Andrews is a 53 year old female who presents with worsening bilateral knee pain.  She experiences worsening knee pain despite previous treatments, including cortisone injections and a gel series, which were ineffective. She has not yet tried prescription anti-inflammatory medications such as Celebrex or meloxicam. She has used Voltaren  gel in the past and is inquiring about knee braces, noting that a previous brace was too rigid for her comfort. Her A1c level is 9.5.  Results A1c: 9.5  Knee exams are unchanged.  Assessment and Plan Chronic bilateral knee pain from OA Chronic knee pain with ineffective past treatments. Considering non-surgical options due to elevated A1c. - Discussed physical therapy, knee brace, weight loss, fish oils, turmeric, and topical Voltaren  gel. - Prescribe Celebrex or meloxicam if needed. - Evaluate knee brace for comfort and suitability. - Defer knee replacement until A1c is 7.7 or less.  Type 2 diabetes mellitus with elevated A1c A1c at 9.5, above acceptable level for surgery. Requires reduction to 7.7 or less for knee replacement eligibility. - Monitor and manage A1c to achieve target of 7.7 or less.  Follow-Up Instructions: No follow-ups on file.   Orders:  Orders Placed This Encounter  Procedures   XR KNEE 3 VIEW LEFT   XR KNEE 3 VIEW RIGHT   No orders of the defined types were placed in this encounter.     Procedures: No procedures performed   Clinical Data: No additional findings.   Subjective: Chief  Complaint  Patient presents with   Left Knee - Pain   Right Knee - Pain    HPI  Review of Systems  Constitutional: Negative.   HENT: Negative.    Eyes: Negative.   Respiratory: Negative.    Cardiovascular: Negative.   Endocrine: Negative.   Musculoskeletal: Negative.   Neurological: Negative.   Hematological: Negative.   Psychiatric/Behavioral: Negative.    All other systems reviewed and are negative.    Objective: Vital Signs: There were no vitals taken for this visit.  Physical Exam Vitals and nursing note reviewed.  Constitutional:      Appearance: She is well-developed.  HENT:     Head: Atraumatic.     Nose: Nose normal.  Eyes:     Extraocular Movements: Extraocular movements intact.  Cardiovascular:     Pulses: Normal pulses.  Pulmonary:     Effort: Pulmonary effort is normal.  Abdominal:     Palpations: Abdomen is soft.  Musculoskeletal:     Cervical back: Neck supple.  Skin:    General: Skin is warm.     Capillary Refill: Capillary refill takes less than 2 seconds.  Neurological:     Mental Status: She is alert. Mental status is at baseline.  Psychiatric:        Behavior: Behavior normal.        Thought Content: Thought content normal.        Judgment:  Judgment normal.     Ortho Exam  Specialty Comments:  No specialty comments available.  Imaging: No results found.   PMFS History: Patient Active Problem List   Diagnosis Date Noted   Hyperlipidemia 07/25/2021   Influenza vaccine needed 05/08/2020   Obesity (BMI 30.0-34.9) 05/08/2020   Moderate persistent asthma without complication 05/08/2020   Post-traumatic osteoarthritis of both knees 05/08/2020   Acute pyelonephritis 04/09/2020   Constipation 04/09/2020   Type 2 diabetes mellitus with hyperglycemia (HCC) 04/08/2020   Essential hypertension 04/08/2020   Past Medical History:  Diagnosis Date   Anxiety    Asthma    Diabetes mellitus without complication (HCC)    Hypertension      Family History  Problem Relation Age of Onset   Breast cancer Maternal Aunt     Past Surgical History:  Procedure Laterality Date   BREAST SURGERY     CHOLECYSTECTOMY     HIGH RISK BREAST EXCISION     KNEE SURGERY     Social History   Occupational History   Not on file  Tobacco Use   Smoking status: Never   Smokeless tobacco: Never  Vaping Use   Vaping status: Never Used  Substance and Sexual Activity   Alcohol use: Not Currently   Drug use: Never   Sexual activity: Not on file

## 2023-08-27 DIAGNOSIS — M1710 Unilateral primary osteoarthritis, unspecified knee: Secondary | ICD-10-CM | POA: Diagnosis not present

## 2023-08-27 DIAGNOSIS — M25562 Pain in left knee: Secondary | ICD-10-CM | POA: Diagnosis not present

## 2023-08-27 DIAGNOSIS — M25561 Pain in right knee: Secondary | ICD-10-CM | POA: Diagnosis not present

## 2023-09-02 ENCOUNTER — Telehealth: Payer: Self-pay | Admitting: Internal Medicine

## 2023-09-02 NOTE — Telephone Encounter (Signed)
 Confirmed appt for 7/28

## 2023-09-05 ENCOUNTER — Other Ambulatory Visit: Payer: Self-pay | Admitting: Internal Medicine

## 2023-09-05 ENCOUNTER — Other Ambulatory Visit: Payer: Self-pay

## 2023-09-05 ENCOUNTER — Encounter: Payer: Self-pay | Admitting: Pharmacist

## 2023-09-05 ENCOUNTER — Ambulatory Visit: Attending: Internal Medicine | Admitting: Pharmacist

## 2023-09-05 ENCOUNTER — Other Ambulatory Visit (HOSPITAL_COMMUNITY): Payer: Self-pay

## 2023-09-05 DIAGNOSIS — E1165 Type 2 diabetes mellitus with hyperglycemia: Secondary | ICD-10-CM

## 2023-09-05 DIAGNOSIS — Z794 Long term (current) use of insulin: Secondary | ICD-10-CM | POA: Diagnosis not present

## 2023-09-05 DIAGNOSIS — Z7984 Long term (current) use of oral hypoglycemic drugs: Secondary | ICD-10-CM | POA: Diagnosis not present

## 2023-09-05 DIAGNOSIS — D649 Anemia, unspecified: Secondary | ICD-10-CM | POA: Diagnosis not present

## 2023-09-05 MED ORDER — BD PEN NEEDLE NANO 2ND GEN 32G X 4 MM MISC
1 refills | Status: DC
Start: 2023-09-05 — End: 2023-12-22

## 2023-09-05 MED ORDER — ACCU-CHEK GUIDE W/DEVICE KIT: PACK | 0 refills | Status: AC

## 2023-09-05 MED ORDER — ACCU-CHEK GUIDE TEST VI STRP: ORAL_STRIP | 1 refills | Status: AC

## 2023-09-05 MED ORDER — AZELASTINE HCL 0.1 % NA SOLN: 2.0000 | Freq: Two times a day (BID) | NASAL | 1 refills | Status: AC

## 2023-09-05 MED ORDER — INSULIN LISPRO (1 UNIT DIAL) 100 UNIT/ML (KWIKPEN): 26.0000 [IU] | PEN_INJECTOR | Freq: Three times a day (TID) | SUBCUTANEOUS | 1 refills | Status: DC

## 2023-09-05 MED ORDER — ALCOHOL PREP 70 % PADS: MEDICATED_PAD | 1 refills | Status: DC

## 2023-09-05 MED ORDER — TOUJEO MAX SOLOSTAR 300 UNIT/ML ~~LOC~~ SOPN: 74.0000 [IU] | PEN_INJECTOR | Freq: Every day | SUBCUTANEOUS | 1 refills | Status: DC

## 2023-09-05 MED ORDER — BREO ELLIPTA 100-25 MCG/ACT IN AEPB
1.0000 | INHALATION_SPRAY | Freq: Every day | RESPIRATORY_TRACT | 1 refills | Status: DC
  Filled 2023-09-05 (×4): qty 180, 90d supply, fill #0

## 2023-09-05 NOTE — Progress Notes (Signed)
 S:    PCP: Dr. Vicci  53 y.o. female who presents for diabetes evaluation, education, and management.   PMH is significant for 2DM w/ peripheral neuropathy, HTN, opiate dependence, anxiety disorder, asthma, migraines, OA knees, alopecia. Patient was referred by Primary Care Provider, Dr. Vicci, on 07/28/2023. At that visit, A1c was 9.5 (down from 10.2 prior).    Last visit with pharmacist 07/05/2023, patient CGM data showed poor glycemic control. We increased her insulin  doses. Dr. Vicci increased her further upon seeing her last month. Lantus  was supposed to increase to 74 units daily, however, she reports using 68 units daily still. She did increase her Humalog  to 22 units and has been TID before meals.   Patient reports using her FL3+. Patient reports adherence to insulin  regimen and denies any side effects. Would like new rxns for pen needles, alcohol  swabs, and a back up GM .  Patient denies hypoglycemia.  Patient reports occasional polyuria, Patient reports occasional neuropathy (nerve pain) in her feet when she knows her sugars are high and it goes away.  Patient reports visual changes but self resolves when her sugars are normal.  Patient reports self foot exams.  Current diabetes medications include: Lantus  74 (still using 68 units) units daily in AM, Humalog  22 units TID before meals  Insurance coverage: UHC Medicare + Medicaid   No relevant family hx No smoking hx   Patient reported 2-3 meals/day Breakfast: glucose shake, sometimes skips Lunch: grilled chicken, salad, vegetables Dinner: [place] tacos, [place] spaghetti Snacks: reports usually not snacking, sometimes oranges or grapes Beverages: mostly water, coke zero  Exercise: walks around complex (15-20 mins) 3 times a week; starting physical therapy   O:  Date of Download: 09/05/2023, 4 week report % Time CGM is active: 74% Average Glucose: 239 mg/dL Glucose Management Indicator: 9.0%  Glucose Variability:  39.3 (goal <36%) Time in Goal:  - Time in range 70-180: 29% - Time above range: 71% (30% high, 41% very high) - Time below range: 0%  Lab Results  Component Value Date   HGBA1C 9.5 (A) 07/28/2023   There were no vitals filed for this visit.  Lipid Panel     Component Value Date/Time   CHOL 128 07/28/2023 1219   TRIG 142 07/28/2023 1219   HDL 91 07/28/2023 1219   CHOLHDL 1.4 07/28/2023 1219   LDLCALC 14 07/28/2023 1219    Clinical Atherosclerotic Cardiovascular Disease (ASCVD): No  The ASCVD Risk score (Arnett DK, et al., 2019) failed to calculate for the following reasons:   The valid total cholesterol range is 130 to 320 mg/dL   A/P: Diabetes longstanding, currently above goal based on recent A1c of 9.5%. This is improving and is the best she's been in at least 2 years. Copmmended her for this but we still have work to do. CGM report shows GMI over the last 4 weeks of 9.0%. Patient denies any recent hypoglycemia confirmed with CGM report. Adherence to insulin  regimen is suboptimal - she is taking less Lantus  than prescribed. I will change Lantus  to Toujeo  for an extended day supply.1 pack of Toujeo  Max pens will get her over a 30-day supply whereas a Lantus  pack is only a 20 day supply. Additionally, I think absorption will be improved with a more concentrated glargine pen.  - Stop Lantus  and start Toujeo  instead. Increase 74 units every morning.  - Increase Humalog  to 26 units three times a day BEFORE meals. Skip if not eating.  - Rxns  for pen needles, alcohol  swabs, glucometer supplies sent. Also sent requested refills for 90-day supplies. -Patient educated on purpose, proper use, and potential adverse effects of insulin .  -Extensively discussed pathophysiology of diabetes, recommended lifestyle interventions, dietary effects on blood sugar control.  -Counseled on s/sx of and management of hypoglycemia.  -Next A1c anticipated 10/2023  Written patient instructions provided.  Patient verbalized understanding of treatment plan.  Total time in face to face counseling 30 minutes.    Follow-up:  Pharmacist: 11/07/2023. PCP: 09/09/2023  Herlene Fleeta Morris, PharmD, BCACP, CPP Clinical Pharmacist [facility] [phone]

## 2023-09-06 LAB — IRON,TIBC AND FERRITIN PANEL
Ferritin: 314 ng/mL — ABNORMAL HIGH (ref 15–150)
Iron Saturation: 15 % (ref 15–55)
Iron: 38 ug/dL (ref 27–159)
Total Iron Binding Capacity: 260 ug/dL (ref 250–450)
UIBC: 222 ug/dL (ref 131–425)

## 2023-09-07 ENCOUNTER — Telehealth: Payer: Self-pay | Admitting: Pharmacist

## 2023-09-07 ENCOUNTER — Other Ambulatory Visit: Payer: Self-pay | Admitting: Internal Medicine

## 2023-09-07 ENCOUNTER — Ambulatory Visit: Payer: Self-pay | Admitting: Internal Medicine

## 2023-09-07 ENCOUNTER — Other Ambulatory Visit: Payer: Self-pay

## 2023-09-07 DIAGNOSIS — D649 Anemia, unspecified: Secondary | ICD-10-CM

## 2023-09-07 DIAGNOSIS — R7989 Other specified abnormal findings of blood chemistry: Secondary | ICD-10-CM

## 2023-09-07 NOTE — Telephone Encounter (Signed)
 Hey friend,   Her pharmacy is saying PA but our test claim says $0. Can we make sure no PA is needed?

## 2023-09-08 ENCOUNTER — Telehealth: Payer: Self-pay | Admitting: Internal Medicine

## 2023-09-08 NOTE — Telephone Encounter (Signed)
 Doris Agricultural consultant confirmed appt for 8/1

## 2023-09-09 ENCOUNTER — Ambulatory Visit: Admitting: Internal Medicine

## 2023-09-09 ENCOUNTER — Telehealth (HOSPITAL_BASED_OUTPATIENT_CLINIC_OR_DEPARTMENT_OTHER): Admitting: Internal Medicine

## 2023-09-09 ENCOUNTER — Other Ambulatory Visit: Payer: Self-pay

## 2023-09-09 DIAGNOSIS — Z794 Long term (current) use of insulin: Secondary | ICD-10-CM | POA: Diagnosis not present

## 2023-09-09 DIAGNOSIS — Z1211 Encounter for screening for malignant neoplasm of colon: Secondary | ICD-10-CM

## 2023-09-09 DIAGNOSIS — I152 Hypertension secondary to endocrine disorders: Secondary | ICD-10-CM | POA: Diagnosis not present

## 2023-09-09 DIAGNOSIS — Z1231 Encounter for screening mammogram for malignant neoplasm of breast: Secondary | ICD-10-CM

## 2023-09-09 DIAGNOSIS — E1159 Type 2 diabetes mellitus with other circulatory complications: Secondary | ICD-10-CM

## 2023-09-09 MED ORDER — BLOOD PRESSURE MONITOR DEVI: 0 refills | Status: DC

## 2023-09-09 NOTE — Progress Notes (Signed)
 Patient ID: Laurie Andrews, female   DOB: February 12, 1970, 53 y.o.   MRN: [id number] Virtual Visit via Video Note  I connected with Samule Serve on 09/09/2023 at 10:43 AM by a video enabled telemedicine application and verified that I am speaking with the correct person using two identifiers.  Location: Patient: home Provider: Office   I discussed the limitations of evaluation and management by telemedicine and the availability of in person appointments. The patient expressed understanding and agreed to proceed.  History of Present Illness: Patient with history of DM type II with peripheral neuropathy, HTN, opiate dependence, anxiety disorder, asthma, migraines, OA knees, alopecia.      Discussed the use of AI scribe software for clinical note transcription with the patient, who gave verbal consent to proceed.  History of Present Illness Laurie Andrews is a 53 year old female with hypertension who presents for follow-up of her blood pressure management.  She previously experienced lower extremity edema, which led to a reduction in amlodipine  from 10 mg to 5 mg. Valsartan  was initially increased to 160 mg but was reduced to 80 mg due to issues with the higher dose. Hydralazine  10 mg twice daily was added to her regimen. She is taking and tolerating. She has not been able to check her blood pressure recently but plans to do so when her daughter returns home with a blood pressure cuff. She is limiting her salt intake.  HM: Reports that her diabetic eye exam is scheduled for November or December.  Due for mammogram.  Reports that her Cologuard test is outdated and needs a new one.  Due for Pap smear.  She is agreeable to being scheduled for it.   Outpatient Encounter Medications as of 09/09/2023  Medication Sig   Accu-Chek Softclix Lancets lancets USE AS INSTRUCTED   albuterol  (PROVENTIL ) (2.5 MG/3ML) 0.083% nebulizer solution INHALE 3 ML BY NEBULIZATION EVERY 6 HOURS AS NEEDED FOR WHEEZING OR  SHORTNESS OF BREATH   Alcohol  Swabs (ALCOHOL  PREP) 70 % PADS Use with 4 insulin  injections daily.   amitriptyline  (ELAVIL ) 100 MG tablet Take 100 mg by mouth as needed for sleep. (Patient not taking: Reported on 07/28/2023)   amLODipine  (NORVASC ) 5 MG tablet Take 1 tablet (5 mg total) by mouth daily.   atorvastatin  (LIPITOR) 10 MG tablet TAKE 1 TABLET BY MOUTH EVERY DAY   azelastine  (ASTELIN ) 0.1 % nasal spray Place 2 sprays into both nostrils 2 (two) times daily. Use in each nostril as directed   Blood Glucose Monitoring Suppl (ACCU-CHEK GUIDE) w/Device KIT Use to check blood sugar 3 times daily.   BREO ELLIPTA  100-25 MCG/ACT AEPB Inhale 1 puff into the lungs daily.   cetirizine  (ZYRTEC ) 10 MG tablet Take 1 tablet (10 mg total) by mouth daily.   Cholecalciferol (VITAMIN D3) 25 MCG (1000 UT) capsule Take 1 capsule (1,000 Units total) by mouth daily.   Continuous Blood Gluc Receiver (FREESTYLE LIBRE 3 READER) DEVI 1 application  by Does not apply route in the morning, at noon, and at bedtime. Use to check blood sugar TID. E11.65   Continuous Glucose Sensor (FREESTYLE LIBRE 3 PLUS SENSOR) MISC USE TO CHECK BLOOD SUGAR THREE TIMES A DAY   diclofenac  Sodium (VOLTAREN ) 1 % GEL Apply 2 g topically 4 (four) times daily.   esomeprazole  (NEXIUM ) 20 MG capsule Take 1 capsule (20 mg total) by mouth 2 (two) times daily before a meal. Stop Pantoprazole    furosemide  (LASIX ) 20 MG tablet Take 1 tablet (20 mg total)  by mouth daily as needed for edema.   GAVILAX 17 GM/SCOOP powder Take 17 g by mouth daily as needed for mild constipation.   glucose blood (ACCU-CHEK GUIDE TEST) test strip Use to check blood sugar 3 times daily.   hydrALAZINE  (APRESOLINE ) 10 MG tablet Take 1 tablet (10 mg total) by mouth in the morning and at bedtime.   insulin  glargine, 2 Unit Dial , (TOUJEO  MAX SOLOSTAR) 300 UNIT/ML Solostar Pen Inject 74 Units into the skin daily.   insulin  lispro (HUMALOG ) 100 UNIT/ML KwikPen Inject 26 Units into  the skin 3 (three) times daily.   Insulin  Pen Needle (BD PEN NEEDLE NANO 2ND GEN) 32G X 4 MM MISC Use to inject insulin  4 times daily.   montelukast  (SINGULAIR ) 10 MG tablet TAKE 1 TABLET BY MOUTH EVERY DAY   naloxone  (NARCAN ) nasal spray 4 mg/0.1 mL USE AS DIRECTED   naratriptan  (AMERGE) 2.5 MG tablet TAKE 1 TABLET BY MOUTH AS NEEDED FOR MIGRAINE.   nystatin cream (MYCOSTATIN) Apply 1 application topically 3 (three) times daily as needed for dry skin.   oxyCODONE  (OXY IR/ROXICODONE ) 5 MG immediate release tablet Take 5 mg by mouth in the morning, at noon, and at bedtime.   PROAIR  HFA 108 (90 Base) MCG/ACT inhaler Inhale 2 puffs into the lungs every 6 (six) hours as needed for wheezing or shortness of breath. Note to pharmacy: Dispense as written.  No substitution allowed.   topiramate  (TOPAMAX ) 25 MG tablet Take 50 mg by mouth 2 (two) times daily.   triamcinolone  cream (KENALOG ) 0.1 % APPLY TO AFFECTED AREA TWICE A DAY   valsartan  (DIOVAN ) 80 MG tablet Take 1 tablet (80 mg total) by mouth daily. Patient intolerant of the 160 mg dose   No facility-administered encounter medications on file as of 09/09/2023.      Observations/Objective: Older AAF in NAD  Assessment and Plan: 1. Hypertension associated with diabetes (HCC) (Primary) Patient will continue amlodipine  5 mg daily, Diovan  80 mg daily and hydralazine  10 mg twice a day.  She does not have any blood pressure readings this year.  She is agreeable for me to send prescription to her pharmacy for blood pressure monitoring device.  I am not sure whether it would be covered. - Blood Pressure Monitor DEVI; UAD to check blood pressure  Dispense: 1 each; Refill: 0  2. Screening for colon cancer - Cologuard  3. Encounter for screening mammogram for malignant neoplasm of breast - MM 3D SCREENING MAMMOGRAM BILATERAL BREAST; Future   Follow Up Instructions: 1 mth for pap   I discussed the assessment and treatment plan with the patient. The  patient was provided an opportunity to ask questions and all were answered. The patient agreed with the plan and demonstrated an understanding of the instructions.   The patient was advised to call back or seek an in-person evaluation if the symptoms worsen or if the condition fails to improve as anticipated.  I spent 11 minutes dedicated to the care of this patient on the date of this encounter to include previsit review of of chart, face-to-face time with patient discussing diagnosis and management and post visit entering of orders  This note has been created with Education officer, environmental. Any transcriptional errors are unintentional.  Barnie Louder, MD

## 2023-09-11 NOTE — Progress Notes (Unsigned)
 Laurie Andrews, female    DOB: [birth year]   MRN: [id number]   Brief patient profile:  57  yobf never smoker asthma for as long as she can remember   referred to pulmonary clinic 09/15/2023 by Clydene Louder  for asthma  moved to [place] around 2021      Pt not previously seen by PCCM service.    Saw allergy/ pulmonary allergy mold dust/ pets never on shots     History of Present Illness  09/15/2023  Pulmonary/ 1st office eval/Suhas Estis on [facility] Chief Complaint  Patient presents with   Consult    Pt states she has excessive mucus. She states that it is a green color. All throughout the day. Been present for months.,   Dyspnea:  [employer] shopping more than a few aisles slow speed/ some walking around appt complex x couple time as week  Cough: worse at hs and waking up many times x months transiently better with prednisone  > min mucoid sputum production with lots of throat clearing > cough  Sleep: flat bed with extra pillow sometimes has to prop up against wall for cough/ not breathing issues  SABA use: 2x daily  three times per week neb     No obvious day to day or daytime pattern/variability or assoc   purulent sputum or mucus plugs or hemoptysis or cp or chest tightness, subjective wheeze or overt sinus or hb symptoms.    Also denies any obvious fluctuation of symptoms with weather or environmental changes or other aggravating or alleviating factors except as outlined above   No unusual exposure hx or h/o childhood pna/ asthma or knowledge of premature birth.  Current Allergies, Complete Past Medical History, Past Surgical History, Family History, and Social History were reviewed in [employer] record.  ROS  The following are not active complaints unless bolded Hoarseness, sore throat/globus , dysphagia, dental problems, itching, sneezing,  nasal congestion or discharge of excess mucus or purulent secretions, ear ache,   fever, chills, sweats, unintended wt loss or wt  gain, classically pleuritic or exertional cp,  orthopnea pnd or arm/hand swelling  or leg swelling, presyncope, palpitations, abdominal pain, anorexia, nausea, vomiting, diarrhea  or change in bowel habits or change in bladder habits, change in stools or change in urine, dysuria, hematuria,  rash, arthralgias, visual complaints, headache, numbness, weakness or ataxia or problems with walking or coordination,  change in mood or  memory.             Outpatient Medications Prior to Visit  Medication Sig Dispense Refill   Accu-Chek Softclix Lancets lancets USE AS INSTRUCTED 300 each 2   albuterol  (PROVENTIL ) (2.5 MG/3ML) 0.083% nebulizer solution INHALE 3 ML BY NEBULIZATION EVERY 6 HOURS AS NEEDED FOR WHEEZING OR SHORTNESS OF BREATH 150 mL 0   Alcohol  Swabs (ALCOHOL  PREP) 70 % PADS Use with 4 insulin  injections daily. 400 each 1   amitriptyline  (ELAVIL ) 100 MG tablet Take 100 mg by mouth as needed for sleep.     amLODipine  (NORVASC ) 5 MG tablet Take 1 tablet (5 mg total) by mouth daily. 90 tablet 1   atorvastatin  (LIPITOR) 10 MG tablet TAKE 1 TABLET BY MOUTH EVERY DAY 90 tablet 1   azelastine  (ASTELIN ) 0.1 % nasal spray Place 2 sprays into both nostrils 2 (two) times daily. Use in each nostril as directed 90 mL 1   Blood Glucose Monitoring Suppl (ACCU-CHEK GUIDE) w/Device KIT Use to check blood sugar 3 times daily. 1 kit  0   Blood Pressure Monitor DEVI UAD to check blood pressure 1 each 0   BREO ELLIPTA  100-25 MCG/ACT AEPB Inhale 1 puff into the lungs daily. 180 each 1   cetirizine  (ZYRTEC ) 10 MG tablet Take 1 tablet (10 mg total) by mouth daily. 30 tablet 11   Cholecalciferol (VITAMIN D3) 25 MCG (1000 UT) capsule Take 1 capsule (1,000 Units total) by mouth daily. 100 capsule 1   Continuous Blood Gluc Receiver (FREESTYLE LIBRE 3 READER) DEVI 1 application  by Does not apply route in the morning, at noon, and at bedtime. Use to check blood sugar TID. E11.65 1 each 0   Continuous Glucose Sensor  (FREESTYLE LIBRE 3 PLUS SENSOR) MISC USE TO CHECK BLOOD SUGAR THREE TIMES A DAY 2 each 3   diclofenac  Sodium (VOLTAREN ) 1 % GEL Apply 2 g topically 4 (four) times daily. 50 g 0   esomeprazole  (NEXIUM ) 20 MG capsule Take 1 capsule (20 mg total) by mouth 2 (two) times daily before a meal. Stop Pantoprazole  60 capsule 6   furosemide  (LASIX ) 20 MG tablet Take 1 tablet (20 mg total) by mouth daily as needed for edema. 30 tablet 3   GAVILAX 17 GM/SCOOP powder Take 17 g by mouth daily as needed for mild constipation.     glucose blood (ACCU-CHEK GUIDE TEST) test strip Use to check blood sugar 3 times daily. 300 each 1   hydrALAZINE  (APRESOLINE ) 10 MG tablet Take 1 tablet (10 mg total) by mouth in the morning and at bedtime. 180 tablet 1   insulin  glargine, 2 Unit Dial , (TOUJEO  MAX SOLOSTAR) 300 UNIT/ML Solostar Pen Inject 74 Units into the skin daily. 27 mL 1   insulin  lispro (HUMALOG ) 100 UNIT/ML KwikPen Inject 26 Units into the skin 3 (three) times daily. 75 mL 1   Insulin  Pen Needle (BD PEN NEEDLE NANO 2ND GEN) 32G X 4 MM MISC Use to inject insulin  4 times daily. 400 each 1   montelukast  (SINGULAIR ) 10 MG tablet TAKE 1 TABLET BY MOUTH EVERY DAY 90 tablet 0   naloxone  (NARCAN ) nasal spray 4 mg/0.1 mL USE AS DIRECTED 2 each 0   naratriptan  (AMERGE) 2.5 MG tablet TAKE 1 TABLET BY MOUTH AS NEEDED FOR MIGRAINE. 10 tablet 1   nystatin cream (MYCOSTATIN) Apply 1 application topically 3 (three) times daily as needed for dry skin.     oxyCODONE  (OXY IR/ROXICODONE ) 5 MG immediate release tablet Take 5 mg by mouth in the morning, at noon, and at bedtime.     PROAIR  HFA 108 (90 Base) MCG/ACT inhaler Inhale 2 puffs into the lungs every 6 (six) hours as needed for wheezing or shortness of breath. Note to pharmacy: Dispense as written.  No substitution allowed. 8.5 g 6   TOUJEO  SOLOSTAR 300 UNIT/ML Solostar Pen SMARTSIG:46 Unit(s) SUB-Q Daily     triamcinolone  cream (KENALOG ) 0.1 % APPLY TO AFFECTED AREA TWICE A DAY  30 g 0   valsartan  (DIOVAN ) 80 MG tablet Take 1 tablet (80 mg total) by mouth daily. Patient intolerant of the 160 mg dose 90 tablet 1   topiramate  (TOPAMAX ) 25 MG tablet Take 50 mg by mouth 2 (two) times daily. (Patient not taking: Reported on 09/15/2023)     No facility-administered medications prior to visit.    Past Medical History:  Diagnosis Date   Anxiety    Asthma    Diabetes mellitus without complication (HCC)    Hypertension       Objective:  BP (!) 157/85 Comment: rechecked  Pulse 71   Temp 97.7 F (36.5 C)   Ht 5' 1 (1.549 m)   Wt 191 lb 12.8 oz (87 kg)   SpO2 99% Comment: RA  BMI 36.24 kg/m   SpO2: 99 % (RA)  Amb mod obese (by BMI) BF nad/ occ throat throat clearing     HEENT : Oropharynx  clear      Nasal turbinates mild pallor s secretions/ polyps    NECK :  without  apparent JVD/ palpable Nodes/TM    LUNGS: no acc muscle use,  Nl contour chest which is clear to A and P bilaterally without cough on insp or exp maneuvers   CV:  RRR  no s3 or murmur or increase in P2, and no edema   ABD:  soft and nontender   MS:  Gait nl   ext warm without deformities Or obvious joint restrictions  calf tenderness, cyanosis or clubbing    SKIN: warm and dry without lesions    NEURO:  alert, approp, nl sensorium with  no motor or cerebellar deficits apparent.         I personally reviewed images and agree with radiology impression as follows:  CXR:   06/28/23  No active dz  Assessment     Assessment & Plan Cough variant asthma Onset since childhood/ never smoker - 09/15/2023  After extensive coaching inhaler device,  effectiveness =    60% (short ti) try change breo to symbicort  80 with clear lungs and refractory cough   DDX of  difficult airways management almost all start with A and  include Adherence, Ace Inhibitors, Acid Reflux, Active Sinus Disease, Alpha 1 Antitripsin deficiency, Anxiety masquerading as Airways dz,  ABPA,  Allergy(esp in young),  Aspiration (esp in elderly), Adverse effects of meds,  Active smoking or vaping, A bunch of PE's (a small clot burden can't cause this syndrome unless there is already severe underlying pulm or vascular dz with poor reserve) plus two Bs  = Bronchiectasis and Beta blocker use..and one C= CHF   Adherence is always the initial prime suspect and is a multilayered concern that requires a trust but verify approach in every patient - starting with knowing how to use medications, especially inhalers, correctly, keeping up with refills and understanding the fundamental difference between maintenance and prns vs those medications only taken for a very short course and then stopped and not refilled.  - see hfa teaching - confirm techique next ov - return with all meds in hand using a trust but verify approach to confirm accurate Medication  Reconciliation The principal here is that until we are certain that the  patients are doing what we've asked, it makes no sense to ask them to do more.   ? Acid (or non-acid) GERD > always difficult to exclude as up to 75% of pts in some series report no assoc GI/ Heartburn symptoms> rec max (24h)  acid suppression and diet restrictions/ reviewed and instructions given in writing.   ? Allergy > doubt but should be ok on symb 80 2bid and singulair   Re saba: Re SABA :  I spent extra time with pt today reviewing appropriate use of albuterol  for prn use on exertion with the following points: 1) saba is for relief of sob that does not improve by walking a slower pace or resting but rather if the pt does not improve after trying this first. 2) If the pt is convinced, as  many are, that saba helps recover from activity faster then it's easy to tell if this is the case by re-challenging : ie stop, take the inhaler, then p 5 minutes try the exact same activity (intensity of workload) that just caused the symptoms and see if they are substantially diminished or not after saba 3) if  there is an activity that reproducibly causes the symptoms, try the saba 15 min before the activity on alternate days   If in fact the saba really does help, then fine to continue to use it prn but advised may need to look closer at the maintenance regimen being used to achieve better control of airways disease with exertion.    ? Adverse drug effects > d/c DPI and replace with low dose hfa ics trial basis   ? Active sinus dz/ rhinitis > zpak and 1st gen H1 blockers per guidelines     F/u in  8 weeks  - call sooner if needed     Each maintenance medication was reviewed in detail including emphasizing most importantly the difference between maintenance and prns and under what circumstances the prns are to be triggered using an action plan format where appropriate.  Total time for H and P, chart review, counseling, reviewing hfa/ neb device(s) and generating customized AVS unique to this office visit / same day charting = 50 min new pt eval        Ozell America, MD    Assessment & Plan Cough variant asthma Onset since childhood/ never smoker - 09/15/2023  After extensive coaching inhaler device,  effectiveness =    60% (short ti) try change breo to symbicort  80 with clear lungs and refractory cough   DDX of  difficult airways management almost all start with A and  include Adherence, Ace Inhibitors, Acid Reflux, Active Sinus Disease, Alpha 1 Antitripsin deficiency, Anxiety masquerading as Airways dz,  ABPA,  Allergy(esp in young), Aspiration (esp in elderly), Adverse effects of meds,  Active smoking or vaping, A bunch of PE's (a small clot burden can't cause this syndrome unless there is already severe underlying pulm or vascular dz with poor reserve) plus two Bs  = Bronchiectasis and Beta blocker use..and one C= CHF   Adherence is always the initial prime suspect and is a multilayered concern that requires a trust but verify approach in every patient - starting with knowing how to use  medications, especially inhalers, correctly, keeping up with refills and understanding the fundamental difference between maintenance and prns vs those medications only taken for a very short course and then stopped and not refilled.  - see hfa teaching - confirm techique next ov - return with all meds in hand using a trust but verify approach to confirm accurate Medication  Reconciliation The principal here is that until we are certain that the  patients are doing what we've asked, it makes no sense to ask them to do more.   ? Acid (or non-acid) GERD > always difficult to exclude as up to 75% of pts in some series report no assoc GI/ Heartburn symptoms> rec max (24h)  acid suppression and diet restrictions/ reviewed and instructions given in writing.   ? Allergy > doubt but should be ok on symb 80 2bid and singulair   Re saba: Re SABA :  I spent extra time with pt today reviewing appropriate use of albuterol  for prn use on exertion with the following points: 1) saba is for relief of sob that does not  improve by walking a slower pace or resting but rather if the pt does not improve after trying this first. 2) If the pt is convinced, as many are, that saba helps recover from activity faster then it's easy to tell if this is the case by re-challenging : ie stop, take the inhaler, then p 5 minutes try the exact same activity (intensity of workload) that just caused the symptoms and see if they are substantially diminished or not after saba 3) if there is an activity that reproducibly causes the symptoms, try the saba 15 min before the activity on alternate days   If in fact the saba really does help, then fine to continue to use it prn but advised may need to look closer at the maintenance regimen being used to achieve better control of airways disease with exertion.    ? Adverse drug effects > d/c DPI and replace with low dose hfa ics  ? Active sinus dz/ rhinitis > zpak and 1st gen H1 blockers per  guidelines     F/u in  8 weeks  - call sooner if needed   Each maintenance medication was reviewed in detail including emphasizing most importantly the difference between maintenance and prns and under what circumstances the prns are to be triggered using an action plan format where appropriate.  Total time for H and P, chart review, counseling, reviewing hfa/ neb device(s) and generating customized AVS unique to this office visit / same day charting = 60 min new pt eval           Patient Instructions  Zpak   Plan A = Automatic = Always=    Symbicort  80 (Breyna ) Take 2 puffs first thing in am and then another 2 puffs about 12 hours later.    Work on inhaler technique:  relax and gently blow all the way out then take a nice smooth full deep breath back in, triggering the inhaler at same time you start breathing in.  Hold breath in for at least  5 seconds if you can. Blow out Symbicort   thru nose. Rinse and gargle with water when done.  If mouth or throat bother you at all,  try brushing teeth/gums/tongue with arm and hammer toothpaste/ make a slurry and gargle and spit out.      Plan B = Backup (to supplement plan A, not to replace it) Use your albuterol  inhaler as a rescue medication to be used if you can't catch your breath by resting or slowing your pace  or doing a relaxed purse lip breathing pattern.  - The less you use it, the better it will work when you need it. - Ok to use the inhaler up to 2 puffs  every 4 hours if you must but call for appointment if use goes up over your usual need - Don't leave home without it !!  (think of it like the spare tire or starter fluid for your car)   Plan C = Crisis (instead of Plan B but only if Plan B stops working) - only use your albuterol  nebulizer if you first try Plan B and it fails to help > ok to use the nebulizer up to every 4 hours but if start needing it regularly call for immediate appointment  Also  Ok to try albuterol  15 min before  an activity (on alternating days)  that you know would usually make you short of breath and see if it makes any difference and if makes  none then don't take albuterol  after activity unless you can't catch your breath as this means it's the resting that helps, not the albuterol .        Nexium  Take 30- 60 min before your first and last meals of the day   Cough > mucinex dm  up to 1200 mg every 12 hours  For drainage / throat tickle try take CHLORPHENIRAMINE  4 mg  (Allergy Relief 4mg   at [facility] should be easiest to find in the blue box usually on bottom shelf)  take one every 4 hours as needed - extremely effective and inexpensive over the counter- may cause drowsiness so start with just a dose or two an hour before bedtime and see how you tolerate it before trying in daytime.    Ozell America, MD 09/15/2023

## 2023-09-12 ENCOUNTER — Other Ambulatory Visit: Payer: Self-pay

## 2023-09-12 ENCOUNTER — Telehealth: Payer: Self-pay | Admitting: Internal Medicine

## 2023-09-12 NOTE — Telephone Encounter (Signed)
 Called patient, no answer. Left a detailed voicemail. Please schedule an appointment in 1 month for PAP, when patient calls. Thank you.

## 2023-09-12 NOTE — Telephone Encounter (Signed)
-----   Message from Barnie Louder sent at 09/09/2023  1:06 PM EDT ----- Regarding: appt Needs appt for PAP in 1 mth

## 2023-09-13 ENCOUNTER — Other Ambulatory Visit (HOSPITAL_COMMUNITY): Payer: Self-pay

## 2023-09-13 ENCOUNTER — Telehealth: Payer: Self-pay | Admitting: Pharmacist

## 2023-09-13 ENCOUNTER — Telehealth: Payer: Self-pay | Admitting: Internal Medicine

## 2023-09-13 ENCOUNTER — Other Ambulatory Visit: Payer: Self-pay

## 2023-09-13 ENCOUNTER — Other Ambulatory Visit: Payer: Self-pay | Admitting: Pharmacist

## 2023-09-13 DIAGNOSIS — E1159 Type 2 diabetes mellitus with other circulatory complications: Secondary | ICD-10-CM

## 2023-09-13 MED ORDER — BLOOD PRESSURE MONITOR DEVI
0 refills | Status: DC
Start: 2023-09-13 — End: 2023-11-10

## 2023-09-13 MED ORDER — TOUJEO MAX SOLOSTAR 300 UNIT/ML ~~LOC~~ SOPN
74.0000 [IU] | PEN_INJECTOR | Freq: Every day | SUBCUTANEOUS | 1 refills | Status: DC
  Filled 2023-09-13 (×2): qty 27, 109d supply, fill #0

## 2023-09-13 NOTE — Telephone Encounter (Signed)
 Call returned to patient. Will get her set up for delivery from [facility].   Burnard,   Sending this to you just so I can monitor. Are you able to let me know if her Toujeo  was processed for mail order for her?

## 2023-09-13 NOTE — Telephone Encounter (Signed)
 Copied from CRM #[id number]. Topic: General - Call Back - No Documentation >> Sep 13, 2023  1:47 PM Elle L wrote: Reason for CRM: The patient states she missed a call from the Pharmacist. I attempted to call the office but there was no answer. She is requesting a call back at [phone].

## 2023-09-13 NOTE — Telephone Encounter (Signed)
 Copied from CRM [id number]. Topic: Clinical - Prescription Issue >> Sep 13, 2023 10:12 AM Tobias CROME wrote: Reason for CRM: Patient requesting to speak to [facility] directly. Patient having trouble with getting Lantus  from [facility]. Patient requesting callback, [phone]

## 2023-09-13 NOTE — Addendum Note (Signed)
 Addended by: FLEETA MORRIS, GARNETTE L on: 09/13/2023 12:45 PM   Modules accepted: Orders

## 2023-09-14 ENCOUNTER — Other Ambulatory Visit: Payer: Self-pay

## 2023-09-15 ENCOUNTER — Ambulatory Visit: Admitting: Internal Medicine

## 2023-09-15 ENCOUNTER — Encounter: Payer: Self-pay | Admitting: Internal Medicine

## 2023-09-15 ENCOUNTER — Other Ambulatory Visit: Payer: Self-pay

## 2023-09-15 VITALS — BP 157/85 | HR 71 | Temp 97.7°F | Ht 61.0 in | Wt 191.8 lb

## 2023-09-15 DIAGNOSIS — J45991 Cough variant asthma: Secondary | ICD-10-CM | POA: Diagnosis not present

## 2023-09-15 MED ORDER — AZITHROMYCIN 250 MG PO TABS: ORAL_TABLET | ORAL | 0 refills | Status: DC

## 2023-09-15 MED ORDER — BUDESONIDE-FORMOTEROL FUMARATE 80-4.5 MCG/ACT IN AERO: INHALATION_SPRAY | RESPIRATORY_TRACT | 12 refills | Status: DC

## 2023-09-15 NOTE — Assessment & Plan Note (Addendum)
 Onset since childhood/ never smoker - 09/15/2023  After extensive coaching inhaler device,  effectiveness =    60% (short ti) try change breo to symbicort  80 with clear lungs and refractory cough   DDX of  difficult airways management almost all start with A and  include Adherence, Ace Inhibitors, Acid Reflux, Active Sinus Disease, Alpha 1 Antitripsin deficiency, Anxiety masquerading as Airways dz,  ABPA,  Allergy(esp in young), Aspiration (esp in elderly), Adverse effects of meds,  Active smoking or vaping, A bunch of PE's (a small clot burden can't cause this syndrome unless there is already severe underlying pulm or vascular dz with poor reserve) plus two Bs  = Bronchiectasis and Beta blocker use..and one C= CHF   Adherence is always the initial prime suspect and is a multilayered concern that requires a trust but verify approach in every patient - starting with knowing how to use medications, especially inhalers, correctly, keeping up with refills and understanding the fundamental difference between maintenance and prns vs those medications only taken for a very short course and then stopped and not refilled.  - see hfa teaching - confirm techique next ov - return with all meds in hand using a trust but verify approach to confirm accurate Medication  Reconciliation The principal here is that until we are certain that the  patients are doing what we've asked, it makes no sense to ask them to do more.   ? Acid (or non-acid) GERD > always difficult to exclude as up to 75% of pts in some series report no assoc GI/ Heartburn symptoms> rec max (24h)  acid suppression and diet restrictions/ reviewed and instructions given in writing.   ? Allergy > doubt but should be ok on symb 80 2bid and singulair   Re saba: Re SABA :  I spent extra time with pt today reviewing appropriate use of albuterol  for prn use on exertion with the following points: 1) saba is for relief of sob that does not improve by walking a  slower pace or resting but rather if the pt does not improve after trying this first. 2) If the pt is convinced, as many are, that saba helps recover from activity faster then it's easy to tell if this is the case by re-challenging : ie stop, take the inhaler, then p 5 minutes try the exact same activity (intensity of workload) that just caused the symptoms and see if they are substantially diminished or not after saba 3) if there is an activity that reproducibly causes the symptoms, try the saba 15 min before the activity on alternate days   If in fact the saba really does help, then fine to continue to use it prn but advised may need to look closer at the maintenance regimen being used to achieve better control of airways disease with exertion.    ? Adverse drug effects > d/c DPI and replace with low dose hfa ics trial basis   ? Active sinus dz/ rhinitis > zpak and 1st gen H1 blockers per guidelines     F/u in  8 weeks  - call sooner if needed     Each maintenance medication was reviewed in detail including emphasizing most importantly the difference between maintenance and prns and under what circumstances the prns are to be triggered using an action plan format where appropriate.  Total time for H and P, chart review, counseling, reviewing hfa/ neb device(s) and generating customized AVS unique to this office visit / same day  charting = 50 min new pt eval

## 2023-09-15 NOTE — Patient Instructions (Signed)
 Zpak   Plan A = Automatic = Always=    Symbicort  80 (Breyna ) Take 2 puffs first thing in am and then another 2 puffs about 12 hours later.    Work on inhaler technique:  relax and gently blow all the way out then take a nice smooth full deep breath back in, triggering the inhaler at same time you start breathing in.  Hold breath in for at least  5 seconds if you can. Blow out Symbicort   thru nose. Rinse and gargle with water when done.  If mouth or throat bother you at all,  try brushing teeth/gums/tongue with arm and hammer toothpaste/ make a slurry and gargle and spit out.      Plan B = Backup (to supplement plan A, not to replace it) Use your albuterol  inhaler as a rescue medication to be used if you can't catch your breath by resting or slowing your pace  or doing a relaxed purse lip breathing pattern.  - The less you use it, the better it will work when you need it. - Ok to use the inhaler up to 2 puffs  every 4 hours if you must but call for appointment if use goes up over your usual need - Don't leave home without it !!  (think of it like the spare tire or starter fluid for your car)   Plan C = Crisis (instead of Plan B but only if Plan B stops working) - only use your albuterol  nebulizer if you first try Plan B and it fails to help > ok to use the nebulizer up to every 4 hours but if start needing it regularly call for immediate appointment  Also  Ok to try albuterol  15 min before an activity (on alternating days)  that you know would usually make you short of breath and see if it makes any difference and if makes none then don't take albuterol  after activity unless you can't catch your breath as this means it's the resting that helps, not the albuterol .        Nexium  Take 30- 60 min before your first and last meals of the day   Cough > mucinex dm  up to 1200 mg every 12 hours  For drainage / throat tickle try take CHLORPHENIRAMINE  4 mg  (Allergy Relief 4mg   at [facility]  should be easiest to find in the blue box usually on bottom shelf)  take one every 4 hours as needed - extremely effective and inexpensive over the counter- may cause drowsiness so start with just a dose or two an hour before bedtime and see how you tolerate it before trying in daytime.    Please schedule a follow up office visit in 8  weeks, sooner if needed  with all medications /inhalers/ solutions in hand so we can verify exactly what you are taking. This includes all medications from all doctors and over the counters

## 2023-09-16 ENCOUNTER — Other Ambulatory Visit: Payer: Self-pay

## 2023-09-20 DIAGNOSIS — M5431 Sciatica, right side: Secondary | ICD-10-CM | POA: Diagnosis not present

## 2023-09-20 DIAGNOSIS — G8929 Other chronic pain: Secondary | ICD-10-CM | POA: Diagnosis not present

## 2023-09-20 DIAGNOSIS — M545 Low back pain, unspecified: Secondary | ICD-10-CM | POA: Diagnosis not present

## 2023-09-20 DIAGNOSIS — M1710 Unilateral primary osteoarthritis, unspecified knee: Secondary | ICD-10-CM | POA: Diagnosis not present

## 2023-09-20 DIAGNOSIS — Z79891 Long term (current) use of opiate analgesic: Secondary | ICD-10-CM | POA: Diagnosis not present

## 2023-09-27 LAB — LAB REPORT - SCANNED
A1c: 11.2
EGFR: 104
Free T4: 1.3 ng/dL

## 2023-10-10 ENCOUNTER — Emergency Department (HOSPITAL_COMMUNITY)

## 2023-10-10 ENCOUNTER — Encounter (HOSPITAL_COMMUNITY): Payer: Self-pay

## 2023-10-10 ENCOUNTER — Emergency Department (HOSPITAL_COMMUNITY)
Admission: EM | Admit: 2023-10-10 | Discharge: 2023-10-10 | Disposition: A | Discharge status: Home / Self Care | Attending: Emergency Medicine | Admitting: Emergency Medicine

## 2023-10-10 ENCOUNTER — Other Ambulatory Visit: Payer: Self-pay

## 2023-10-10 DIAGNOSIS — M25551 Pain in right hip: Secondary | ICD-10-CM | POA: Diagnosis not present

## 2023-10-10 DIAGNOSIS — M79651 Pain in right thigh: Secondary | ICD-10-CM | POA: Diagnosis not present

## 2023-10-10 DIAGNOSIS — W19XXXA Unspecified fall, initial encounter: Secondary | ICD-10-CM

## 2023-10-10 DIAGNOSIS — M79661 Pain in right lower leg: Secondary | ICD-10-CM | POA: Diagnosis not present

## 2023-10-10 DIAGNOSIS — W010XXA Fall on same level from slipping, tripping and stumbling without subsequent striking against object, initial encounter: Secondary | ICD-10-CM | POA: Diagnosis not present

## 2023-10-10 DIAGNOSIS — M542 Cervicalgia: Secondary | ICD-10-CM | POA: Insufficient documentation

## 2023-10-10 DIAGNOSIS — M79605 Pain in left leg: Secondary | ICD-10-CM | POA: Diagnosis not present

## 2023-10-10 DIAGNOSIS — M25521 Pain in right elbow: Secondary | ICD-10-CM | POA: Insufficient documentation

## 2023-10-10 DIAGNOSIS — M79604 Pain in right leg: Secondary | ICD-10-CM | POA: Insufficient documentation

## 2023-10-10 DIAGNOSIS — M25511 Pain in right shoulder: Secondary | ICD-10-CM | POA: Insufficient documentation

## 2023-10-10 DIAGNOSIS — S0990XA Unspecified injury of head, initial encounter: Secondary | ICD-10-CM | POA: Diagnosis not present

## 2023-10-10 DIAGNOSIS — G8911 Acute pain due to trauma: Secondary | ICD-10-CM | POA: Diagnosis not present

## 2023-10-10 DIAGNOSIS — R519 Headache, unspecified: Secondary | ICD-10-CM | POA: Diagnosis not present

## 2023-10-10 DIAGNOSIS — M25562 Pain in left knee: Secondary | ICD-10-CM | POA: Insufficient documentation

## 2023-10-10 DIAGNOSIS — G44309 Post-traumatic headache, unspecified, not intractable: Secondary | ICD-10-CM | POA: Diagnosis not present

## 2023-10-10 DIAGNOSIS — M25561 Pain in right knee: Secondary | ICD-10-CM | POA: Diagnosis not present

## 2023-10-10 DIAGNOSIS — R079 Chest pain, unspecified: Secondary | ICD-10-CM | POA: Diagnosis not present

## 2023-10-10 DIAGNOSIS — M19011 Primary osteoarthritis, right shoulder: Secondary | ICD-10-CM | POA: Diagnosis not present

## 2023-10-10 DIAGNOSIS — M1712 Unilateral primary osteoarthritis, left knee: Secondary | ICD-10-CM | POA: Diagnosis not present

## 2023-10-10 MED ORDER — CYCLOBENZAPRINE HCL 7.5 MG PO TABS
7.5000 mg | ORAL_TABLET | Freq: Two times a day (BID) | ORAL | 0 refills | Status: AC | PRN
Start: 2023-10-10 — End: ?

## 2023-10-10 MED ORDER — HYDROCODONE-ACETAMINOPHEN 5-325 MG PO TABS
1.0000 | ORAL_TABLET | Freq: Once | ORAL | Status: AC
  Administered 2023-10-10: 1 via ORAL
  Filled 2023-10-10: qty 1

## 2023-10-10 NOTE — Discharge Instructions (Addendum)
 You have been seen and discharged from the emergency department.  Your CT and x-ray imaging were negative for acute fracture.  You are most likely suffering from muscle strain/spasm.  Take your home opiate medication for pain control.  Take muscle relaxer as directed.  Do not mix this medication with alcohol  or other sedating medications. Do not drive or do heavy physical activity until you know how this medication affects you.  It may cause drowsiness.  Follow-up with your primary provider for further evaluation and further care. Take home medications as prescribed. If you have any worsening symptoms or further concerns for your health please return to an emergency department for further evaluation.

## 2023-10-10 NOTE — ED Notes (Signed)
 Patient was assisted in using the bed pan to void. Bed pan was removed and patient was cleaned. Urine sample sent to lab.

## 2023-10-10 NOTE — ED Notes (Signed)
 Iv team at bedside

## 2023-10-10 NOTE — ED Notes (Signed)
Patient was given something to eat and drink  

## 2023-10-10 NOTE — ED Triage Notes (Signed)
 PER EMS: pt picked up from [employer] after slipping on a liquid on the floor causing her to fall down onto her right shoulder and into a split position with her legs. No head injury, no LOC, no blood thinners. She reports lower back pain, right shoulder pain and bilateral all over leg pain from hip down to toes. No deformities or bruising noted. She was able to stand up but avoided putting pressure on her right leg. A&OX4.  BP-160/palp, HR-90, 97%, CBG-327

## 2023-10-10 NOTE — ED Notes (Signed)
With CT 

## 2023-10-10 NOTE — ED Notes (Signed)
 Tyree Dukes (Son) called asking for a update on Laurie Andrews. His number is [phone].

## 2023-10-10 NOTE — ED Provider Notes (Signed)
 [facility] EMERGENCY DEPARTMENT AT [facility] Provider Note   CSN: [id number] Arrival date & time: 10/10/23  1746     Patient presents with: [place]   Laurie Andrews is a 53 y.o. female.   HPI   53 year old female presents emergency department after mechanical fall.  Patient was reportedly walking in [place] when she slipped on water.  She did a split like motion with her right leg forward and fell down onto her right shoulder.  Unclear if she hit her head but did not appear to have loss of consciousness.  Patient denies any blood thinning medication.  She is otherwise been in her usual state of health.  Her main complaint is mild headache, midline neck pain, right shoulder/elbow pain as well as bilateral pelvis and lower extremity pain.  Prior to Admission medications   Medication Sig Start Date End Date Taking? Authorizing Provider  Accu-Chek Softclix Lancets lancets USE AS INSTRUCTED 03/21/23   Vicci Barnie NOVAK, MD  albuterol  (PROVENTIL ) (2.5 MG/3ML) 0.083% nebulizer solution INHALE 3 ML BY NEBULIZATION EVERY 6 HOURS AS NEEDED FOR WHEEZING OR SHORTNESS OF BREATH 02/08/23   Vicci Barnie NOVAK, MD  Alcohol  Swabs (ALCOHOL  PREP) 70 % PADS Use with 4 insulin  injections daily. 09/05/23   Vicci Barnie NOVAK, MD  amitriptyline  (ELAVIL ) 100 MG tablet Take 100 mg by mouth as needed for sleep. 12/29/18   [provider]  amLODipine  (NORVASC ) 5 MG tablet Take 1 tablet (5 mg total) by mouth daily. 07/28/23   Vicci Barnie NOVAK, MD  atorvastatin  (LIPITOR) 10 MG tablet TAKE 1 TABLET BY MOUTH EVERY DAY 05/04/23   Vicci Barnie NOVAK, MD  azelastine  (ASTELIN ) 0.1 % nasal spray Place 2 sprays into both nostrils 2 (two) times daily. Use in each nostril as directed 09/05/23   Vicci Barnie NOVAK, MD  azithromycin  (ZITHROMAX ) 250 MG tablet Take 2 on day one then 1 daily x 4 days 09/15/23   Darlean Ozell NOVAK, MD  Blood Glucose Monitoring Suppl (ACCU-CHEK GUIDE) w/Device KIT Use to check blood sugar  3 times daily. 09/05/23   Vicci Barnie NOVAK, MD  Blood Pressure Monitor DEVI UAD to check blood pressure 09/13/23   Vicci Barnie NOVAK, MD  budesonide -formoterol  (SYMBICORT ) 80-4.5 MCG/ACT inhaler Take 2 puffs first thing in am and then another 2 puffs about 12 hours later. 09/15/23   Darlean Ozell NOVAK, MD  cetirizine  (ZYRTEC ) 10 MG tablet Take 1 tablet (10 mg total) by mouth daily. 06/02/23   Vicci Barnie NOVAK, MD  Cholecalciferol (VITAMIN D3) 25 MCG (1000 UT) capsule Take 1 capsule (1,000 Units total) by mouth daily. 04/21/22   Vicci Barnie NOVAK, MD  Continuous Blood Gluc Receiver (FREESTYLE LIBRE 3 READER) DEVI 1 application  by Does not apply route in the morning, at noon, and at bedtime. Use to check blood sugar TID. E11.65 04/13/22   Vicci Barnie NOVAK, MD  Continuous Glucose Sensor (FREESTYLE LIBRE 3 PLUS SENSOR) MISC USE TO CHECK BLOOD SUGAR THREE TIMES A DAY 07/27/23   Vicci Barnie NOVAK, MD  diclofenac  Sodium (VOLTAREN ) 1 % GEL Apply 2 g topically 4 (four) times daily. 11/17/21   Beverley Leita LABOR, PA-C  esomeprazole  (NEXIUM ) 20 MG capsule Take 1 capsule (20 mg total) by mouth 2 (two) times daily before a meal. Stop Pantoprazole  04/27/23   Vicci Barnie NOVAK, MD  furosemide  (LASIX ) 20 MG tablet Take 1 tablet (20 mg total) by mouth daily as needed for edema. 07/28/23   Vicci Barnie NOVAK, MD  GAVILAX 17 GM/SCOOP powder Take 17 g by mouth daily as needed for mild constipation. 09/04/19   [provider]  glucose blood (ACCU-CHEK GUIDE TEST) test strip Use to check blood sugar 3 times daily. 09/05/23   Vicci Barnie NOVAK, MD  hydrALAZINE  (APRESOLINE ) 10 MG tablet Take 1 tablet (10 mg total) by mouth in the morning and at bedtime. 08/02/23   Vicci Barnie NOVAK, MD  insulin  glargine, 2 Unit Dial , (TOUJEO  MAX SOLOSTAR) 300 UNIT/ML Solostar Pen Inject 74 Units into the skin daily. 09/13/23   Vicci Barnie NOVAK, MD  insulin  lispro (HUMALOG ) 100 UNIT/ML KwikPen Inject 26 Units into the skin 3 (three) times  daily. 09/05/23   Vicci Barnie NOVAK, MD  Insulin  Pen Needle (BD PEN NEEDLE NANO 2ND GEN) 32G X 4 MM MISC Use to inject insulin  4 times daily. 09/05/23   Vicci Barnie NOVAK, MD  montelukast  (SINGULAIR ) 10 MG tablet TAKE 1 TABLET BY MOUTH EVERY DAY 01/03/23   Vicci Barnie NOVAK, MD  naloxone  (NARCAN ) nasal spray 4 mg/0.1 mL USE AS DIRECTED 03/15/22   Vicci Barnie NOVAK, MD  naratriptan  (AMERGE) 2.5 MG tablet TAKE 1 TABLET BY MOUTH AS NEEDED FOR MIGRAINE. 10/12/22   Vicci Barnie NOVAK, MD  nystatin cream (MYCOSTATIN) Apply 1 application topically 3 (three) times daily as needed for dry skin. 01/05/20   [provider]  oxyCODONE  (OXY IR/ROXICODONE ) 5 MG immediate release tablet Take 5 mg by mouth in the morning, at noon, and at bedtime.    [provider]  PROAIR  HFA 108 (90 Base) MCG/ACT inhaler Inhale 2 puffs into the lungs every 6 (six) hours as needed for wheezing or shortness of breath. Note to pharmacy: Dispense as written.  No substitution allowed. 04/28/23   Vicci Barnie NOVAK, MD  topiramate  (TOPAMAX ) 25 MG tablet Take 50 mg by mouth 2 (two) times daily. Patient not taking: Reported on 09/15/2023 03/20/19   [provider]  TOUJEO  SOLOSTAR 300 UNIT/ML Solostar Pen SMARTSIG:46 Unit(s) SUB-Q Daily 03/25/23   [provider]  triamcinolone  cream (KENALOG ) 0.1 % APPLY TO AFFECTED AREA TWICE A DAY 04/27/23   Vicci Barnie NOVAK, MD  valsartan  (DIOVAN ) 80 MG tablet Take 1 tablet (80 mg total) by mouth daily. Patient intolerant of the 160 mg dose 08/02/23   Vicci Barnie NOVAK, MD    Allergies: Keflex [cephalexin], Metformin and related, Trulicity [dulaglutide], Aspirin, Morphine and codeine, and Penicillins    Review of Systems  Constitutional:  Negative for fever.  Respiratory:  Negative for shortness of breath.   Cardiovascular:  Negative for chest pain.  Gastrointestinal:  Negative for abdominal pain, diarrhea and vomiting.  Musculoskeletal:  Positive for neck pain.  Negative for back pain.       + Right shoulder elbow pain, bilateral lower extremity pain  Skin:  Negative for rash.  Neurological:  Positive for headaches. Negative for weakness.    Updated Vital Signs BP 130/87 (BP Location: Right Arm)   Pulse 73   Temp 98 F (36.7 C) (Oral)   Resp 16   Ht 5' 1 (1.549 m)   Wt 85.7 kg   SpO2 100%   BMI 35.71 kg/m   Physical Exam Vitals and nursing note reviewed.  Constitutional:      General: She is not in acute distress.    Appearance: Normal appearance.  HENT:     Head: Normocephalic and atraumatic.     Right Ear: External ear normal.     Left Ear: External  ear normal.     Mouth/Throat:     Mouth: Mucous membranes are moist.  Eyes:     Pupils: Pupils are equal, round, and reactive to light.  Neck:     Comments: Diffuse tenderness to palpation in the cervical spine, midline around C7 Cardiovascular:     Rate and Rhythm: Normal rate.  Pulmonary:     Effort: Pulmonary effort is normal. No respiratory distress.  Abdominal:     Palpations: Abdomen is soft.     Tenderness: There is no abdominal tenderness.  Musculoskeletal:     Comments: No thoracic or lumbar pain, tenderness to palpation or any movement of the right shoulder, elbow as well as the right hip down to the right foot.  Tenderness to palpation of the left knee without acute deformity/swelling.  The legs are vascularly intact  Skin:    General: Skin is warm.  Neurological:     Mental Status: She is alert and oriented to person, place, and time. Mental status is at baseline.  Psychiatric:        Mood and Affect: Mood normal.     (all labs ordered are listed, but only abnormal results are displayed) Labs Reviewed - No data to display  EKG: None  Radiology: DG Femur Portable Min 2 Views Right Result Date: 10/10/2023 CLINICAL DATA:  Fall at [place].  Pain. EXAM: RIGHT FEMUR PORTABLE 2 VIEW COMPARISON:  None Available. FINDINGS: There is no evidence of fracture or other  focal bone lesions. The cortical margins are intact. No hip or knee dislocation. Soft tissues are unremarkable. IMPRESSION: Negative radiographs of the right femur. Electronically Signed   By: Andrea Gasman M.D.   On: 10/10/2023 19:18   DG Chest Port 1 View Result Date: 10/10/2023 CLINICAL DATA:  Fall at [place].  Pain. EXAM: PORTABLE CHEST 1 VIEW COMPARISON:  Radiograph 06/28/2023 FINDINGS: The cardiomediastinal contours are stable, allowing for differences in technique. The lungs are clear. Pulmonary vasculature is normal. No consolidation, pleural effusion, or pneumothorax. No acute osseous abnormalities are seen. IMPRESSION: No acute chest findings. Electronically Signed   By: Andrea Gasman M.D.   On: 10/10/2023 19:17   DG Pelvis Portable Result Date: 10/10/2023 CLINICAL DATA:  Fall at [place].  Right hip pain. EXAM: PORTABLE PELVIS 1-2 VIEWS COMPARISON:  Pelvis radiograph 04/08/2020 FINDINGS: The cortical margins of the bony pelvis are intact. No fracture. Chronic pubic symphyseal degenerative change. Both femoral heads are well-seated in the respective acetabula. IMPRESSION: No pelvic fracture. Electronically Signed   By: Andrea Gasman M.D.   On: 10/10/2023 19:16   DG Tibia/Fibula Right Result Date: 10/10/2023 CLINICAL DATA:  Fall at [place].  Pain. EXAM: RIGHT TIBIA AND FIBULA - 2 VIEW COMPARISON:  None Available. FINDINGS: There is no evidence of fracture or other focal bone lesions. No knee or ankle dislocation. Soft tissues are unremarkable. IMPRESSION: Negative radiographs of the right lower leg. Electronically Signed   By: Andrea Gasman M.D.   On: 10/10/2023 19:15   DG Shoulder Right Result Date: 10/10/2023 CLINICAL DATA:  Fall at [place].  Right shoulder pain. EXAM: RIGHT SHOULDER - 2+ VIEW COMPARISON:  None Available. FINDINGS: There is no evidence of fracture or dislocation. Mild acromioclavicular degenerative spurring. The included ribs are intact. Soft tissues are  unremarkable. IMPRESSION: No fracture or dislocation of the right shoulder. Mild acromioclavicular degenerative change. Electronically Signed   By: Andrea Gasman M.D.   On: 10/10/2023 19:14     Procedures   Medications Ordered  in the ED  HYDROcodone -acetaminophen  (NORCO/VICODIN) 5-325 MG per tablet 1 tablet (has no administration in time range)                                    Medical Decision Making Amount and/or Complexity of Data Reviewed Radiology: ordered.  Risk Prescription drug management.   53 year old female presents emergency department after mechanical slip and fall.  Reports that she went down into the splits position with her right leg forward and down onto her right shoulder.  Complaining of mainly right sided pain.  Questionable LOC, no anticoagulation.  Vitals are normal and stable.  She has diffuse pain to palpation but no noted deformity/swelling.  X-ray and CT imaging are negative for acute fracture.  Currently pain is controlled.  Will plan to treat for muscle strain/spasm.  Patient at this time appears safe and stable for discharge and close outpatient follow up. Discharge plan and strict return to ED precautions discussed, patient verbalizes understanding and agreement.     Final diagnoses:  None    ED Discharge Orders     None          Bari Roxie HERO, DO 10/10/23 2314

## 2023-10-12 ENCOUNTER — Ambulatory Visit: Payer: Self-pay

## 2023-10-12 ENCOUNTER — Other Ambulatory Visit (HOSPITAL_BASED_OUTPATIENT_CLINIC_OR_DEPARTMENT_OTHER): Payer: Self-pay | Admitting: Pharmacist

## 2023-10-12 ENCOUNTER — Encounter: Payer: Self-pay | Admitting: Pharmacist

## 2023-10-12 DIAGNOSIS — E1165 Type 2 diabetes mellitus with hyperglycemia: Secondary | ICD-10-CM

## 2023-10-12 DIAGNOSIS — Z794 Long term (current) use of insulin: Secondary | ICD-10-CM

## 2023-10-12 MED ORDER — INSULIN LISPRO (1 UNIT DIAL) 100 UNIT/ML (KWIKPEN): 32.0000 [IU] | PEN_INJECTOR | Freq: Three times a day (TID) | SUBCUTANEOUS | 1 refills | Status: DC

## 2023-10-12 NOTE — Telephone Encounter (Signed)
 Routing to CMA

## 2023-10-12 NOTE — Progress Notes (Signed)
 I connected with  Laurie Andrews on 10/12/23 by a video enabled telemedicine application and verified that I am speaking with the correct person using two identifiers.   I discussed the limitations of evaluation and management by telemedicine. The patient expressed understanding and agreed to proceed.  Location of myself: home office   Location of patient: home   Persons participating in the call: the patient and myself   S:    PCP: Dr. Vicci  53 y.o. female who presents for diabetes evaluation, education, and management.   PMH is significant for 2DM w/ peripheral neuropathy, HTN, opiate dependence, anxiety disorder, asthma, migraines, OA knees, alopecia. Patient was referred by Primary Care Provider, Dr. Vicci, on 07/28/2023. At that visit, A1c was 9.5 (down from 10.2 prior).   Last visit with pharmacist was on 09/05/2023, patient CGM data showed poor glycemic control. We changed her basal insulin  to Toujeo  and had her take as 74 units daily. We also increased her Humalog  to 26 units TID before meals.   Today, patient reports using her FL3+. Her AGP report is below. Patient reports adherence to insulin  regimen and denies any side effects. Of note, she is taking 78 units daily of Toujeo  and 26 units TID before meals.   Patient denies hypoglycemia.  Patient reports occasional polyuria, Patient reports occasional neuropathy (nerve pain) in her feet when she knows her sugars are high and it goes away.  Patient reports visual changes but self resolves when her sugars are normal.  Patient reports self foot exams.  Current diabetes medications include: Lantus  74 units daily in AM (pt reports taking 78 units), Humalog  26 units TID before meals (pt reports using 28 units)  Insurance coverage: UHC Medicare + Medicaid   No relevant family hx No smoking hx   Patient reported 2-3 meals/day Breakfast: glucose shake, sometimes skips Lunch: grilled chicken, salad, vegetables Dinner:  [place] tacos, [place] spaghetti Snacks: reports usually not snacking, sometimes oranges or grapes Beverages: mostly water, coke zero  Exercise: walks around complex (15-20 mins) 3 times a week; starting physical therapy   O:  Date of Download: 10/12/2023, 4 week report % Time CGM is active: 88% Average Glucose: 249 mg/dL Glucose Management Indicator: 9.3%  Glucose Variability: 30.1 (goal <36%) Time in Goal:  - Time in range 70-180: 19% - Time above range: 80% - Time below range: 1% -Majority of lows occur in the morning.   Lab Results  Component Value Date   HGBA1C 9.5 (A) 07/28/2023   There were no vitals filed for this visit.  Lipid Panel     Component Value Date/Time   CHOL 128 07/28/2023 1219   TRIG 142 07/28/2023 1219   HDL 91 07/28/2023 1219   CHOLHDL 1.4 07/28/2023 1219   LDLCALC 14 07/28/2023 1219    Clinical Atherosclerotic Cardiovascular Disease (ASCVD): No  The ASCVD Risk score (Arnett DK, et al., 2019) failed to calculate for the following reasons:   The valid total cholesterol range is 130 to 320 mg/dL   A/P: Diabetes longstanding, currently above goal based on recent A1c of 9.5%. CGM report shows GMI over the last 4 weeks of 9.3%. Patient is having seldom hypoglycemia with CGM report showing most frequent occurrences between 3-6am. Adherence to insulin  regimen is optimal but she is taking more insulin  than prescribed. As her overnight/early morning sugars are dropping, I will decrease Toujeo  back down to 74 units. Will increase Humalog  to 32 units before meals.  - DECREASE Toujeo  to 74  units every morning.  - Increase Humalog  to 32 units three times a day BEFORE meals. Skip if not eating.  -Patient educated on purpose, proper use, and potential adverse effects of insulin .  -Extensively discussed pathophysiology of diabetes, recommended lifestyle interventions, dietary effects on blood sugar control.  -Counseled on s/sx of and management of hypoglycemia.  -Next  A1c anticipated 10/2023  Written patient instructions provided. Patient verbalized understanding of treatment plan.  Total time in face to face counseling 30 minutes.    Follow-up:  Pharmacist: 11/07/2023. PCP: none scheduled  Herlene Fleeta Morris, PharmD, [place], CPP Clinical Pharmacist [facility] & [facility] [phone]

## 2023-10-12 NOTE — Telephone Encounter (Signed)
 FYI Only or Action Required?: FYI only for provider.  Patient was last seen in primary care on 09/09/2023 by Vicci Barnie NOVAK, MD.  Called Nurse Triage reporting Fall.  Symptoms began 2 days ago.  Interventions attempted: Other: was seen in the ED.  Symptoms are: unchanged.  Triage Disposition: See PCP Within 2 Weeks  Patient/caregiver understands and will follow disposition?: Yes          Copied from CRM [id number]. Topic: Clinical - Red Word Triage >> Oct 12, 2023  1:36 PM Selinda RAMAN wrote: Red Word that prompted transfer to Nurse Triage: The patient had a fall in [place] and was seen at [facility] on September 1st but she is still complaining of pain especially in her foot. She said it is hard to get up and hard to walk. She is also hurting in other areas including her upper body from elbow to shoulders. I will transfer her to [facility] .          Reason for Disposition  [1] Fall AND [2] went to emergency department for evaluation or treatment  Answer Assessment - Initial Assessment Questions Patient's upcoming hospital follow up appointment added to the wait list. Patient instructed to call back for new or worsening symptoms. Patient verbalized understanding and agreement with this plan.        1. MECHANISM: How did the fall happen?     Slipped on water  2. DOMESTIC VIOLENCE AND ELDER ABUSE SCREENING: Did you fall because someone pushed you or tried to hurt you? If Yes, ask: Are you safe now?     No 3. ONSET: When did the fall happen? (e.g., minutes, hours, or days ago)     2 days  4. LOCATION: What part of the body hit the ground? (e.g., back, buttocks, head, hips, knees, hands, head, stomach)     Landed on back  5. INJURY: Did you hurt (injure) yourself when you fell? If Yes, ask: What did you injure? Tell me more about this? (e.g., body area; type of injury; pain severity)     Right shoulder and elbow, lower back, bilateral leg pain  6. PAIN: Is  there any pain? If Yes, ask: How bad is the pain? (e.g., Scale 0-10; or none, mild,      8/10 7. SIZE: For cuts, bruises, or swelling, ask: How large is it? (e.g., inches or centimeters)      N/A 9. OTHER SYMPTOMS: Do you have any other symptoms? (e.g., dizziness, fever, weakness; new-onset or worsening).      No 10. CAUSE: What do you think caused the fall (or falling)? (e.g., dizzy spell, tripped)       Slipped on water  Protocols used: Falls and [facility]

## 2023-10-17 ENCOUNTER — Telehealth: Payer: Self-pay

## 2023-10-17 NOTE — Telephone Encounter (Signed)
 Patient was identified as falling into the [place] Measure - Diabetes.   Patient was: Appointment scheduled for lab or office visit for A1c.

## 2023-10-18 ENCOUNTER — Encounter: Payer: Self-pay | Admitting: Family

## 2023-10-18 ENCOUNTER — Other Ambulatory Visit: Payer: Self-pay | Admitting: Family

## 2023-10-18 ENCOUNTER — Ambulatory Visit (INDEPENDENT_AMBULATORY_CARE_PROVIDER_SITE_OTHER): Admitting: Family

## 2023-10-18 VITALS — BP 138/82 | HR 97 | Temp 98.2°F | Resp 18 | Ht 61.0 in | Wt 191.4 lb

## 2023-10-18 DIAGNOSIS — E119 Type 2 diabetes mellitus without complications: Secondary | ICD-10-CM | POA: Diagnosis not present

## 2023-10-18 DIAGNOSIS — E1165 Type 2 diabetes mellitus with hyperglycemia: Secondary | ICD-10-CM | POA: Diagnosis not present

## 2023-10-18 DIAGNOSIS — Z794 Long term (current) use of insulin: Secondary | ICD-10-CM

## 2023-10-18 DIAGNOSIS — M25511 Pain in right shoulder: Secondary | ICD-10-CM

## 2023-10-18 DIAGNOSIS — W19XXXD Unspecified fall, subsequent encounter: Secondary | ICD-10-CM

## 2023-10-18 MED ORDER — GABAPENTIN 300 MG PO CAPS: 300.0000 mg | ORAL_CAPSULE | Freq: Two times a day (BID) | ORAL | 0 refills | Status: DC | PRN

## 2023-10-18 MED ORDER — ACETAMINOPHEN 500 MG PO TABS: 500.0000 mg | ORAL_TABLET | Freq: Four times a day (QID) | ORAL | 1 refills | Status: DC | PRN

## 2023-10-18 NOTE — Progress Notes (Signed)
 Emergency room follow up slipped and fell on some water in [employer], pain medication is not working ( muscle relaxer's)

## 2023-10-18 NOTE — Progress Notes (Signed)
 Patient ID: Laurie Andrews, female    DOB: [birth year]  MRN: [id number]  CC: Emergency Department Follow-Up  Subjective: Laurie Andrews is a 53 y.o. female who presents for Emergency Department Follow-Up.   Her concerns today include:  - Patient seen on 10/10/2023 (6 hours) at [facility] Department at [facility] for fall initial encounter and workup unremarkable. Today patient reports since fall pain persisting. States Cyclobenzaprine  does not help with pain. States she told her pain doctor that Cyclobenzaprine  does not help with pain and has been waiting for one month for the doctor to change the prescription. Requests referral to Physical Therapy due to needing assistance with activities of daily living (getting dressed). - Type 2 diabetes. Doing well on Insulin  Glargine and Insulin  Lispro, no issues/concerns. Denies red flag symptoms associated with diabetes. She is trying to watch what she eats. She checks blood sugars at home, this morning 317. States she has an upcoming appointment for diabetic eye exam.  Patient Active Problem List   Diagnosis Date Noted   Cough variant asthma 09/15/2023   Hyperlipidemia 07/25/2021   Influenza vaccine needed 05/08/2020   Obesity (BMI 30.0-34.9) 05/08/2020   Moderate persistent asthma without complication 05/08/2020   Post-traumatic osteoarthritis of both knees 05/08/2020   Acute pyelonephritis 04/09/2020   Constipation 04/09/2020   Type 2 diabetes mellitus with hyperglycemia (HCC) 04/08/2020   Essential hypertension 04/08/2020     Current Outpatient Medications on File Prior to Visit  Medication Sig Dispense Refill   Accu-Chek Softclix Lancets lancets USE AS INSTRUCTED 300 each 2   albuterol  (PROVENTIL ) (2.5 MG/3ML) 0.083% nebulizer solution INHALE 3 ML BY NEBULIZATION EVERY 6 HOURS AS NEEDED FOR WHEEZING OR SHORTNESS OF BREATH 150 mL 0   Alcohol  Swabs (ALCOHOL  PREP) 70 % PADS Use with 4 insulin  injections daily. 400  each 1   amitriptyline  (ELAVIL ) 100 MG tablet Take 100 mg by mouth as needed for sleep.     amLODipine  (NORVASC ) 5 MG tablet Take 1 tablet (5 mg total) by mouth daily. 90 tablet 1   atorvastatin  (LIPITOR) 10 MG tablet TAKE 1 TABLET BY MOUTH EVERY DAY 90 tablet 1   azelastine  (ASTELIN ) 0.1 % nasal spray Place 2 sprays into both nostrils 2 (two) times daily. Use in each nostril as directed 90 mL 1   Blood Glucose Monitoring Suppl (ACCU-CHEK GUIDE) w/Device KIT Use to check blood sugar 3 times daily. 1 kit 0   budesonide -formoterol  (SYMBICORT ) 80-4.5 MCG/ACT inhaler Take 2 puffs first thing in am and then another 2 puffs about 12 hours later. 1 each 12   cetirizine  (ZYRTEC ) 10 MG tablet Take 1 tablet (10 mg total) by mouth daily. 30 tablet 11   Cholecalciferol (VITAMIN D3) 25 MCG (1000 UT) capsule Take 1 capsule (1,000 Units total) by mouth daily. 100 capsule 1   Continuous Blood Gluc Receiver (FREESTYLE LIBRE 3 READER) DEVI 1 application  by Does not apply route in the morning, at noon, and at bedtime. Use to check blood sugar TID. E11.65 1 each 0   Continuous Glucose Sensor (FREESTYLE LIBRE 3 PLUS SENSOR) MISC USE TO CHECK BLOOD SUGAR THREE TIMES A DAY 2 each 3   cyclobenzaprine  (FEXMID ) 7.5 MG tablet Take 1 tablet (7.5 mg total) by mouth 2 (two) times daily as needed for muscle spasms. 10 tablet 0   esomeprazole  (NEXIUM ) 20 MG capsule Take 1 capsule (20 mg total) by mouth 2 (two) times daily before a meal. Stop Pantoprazole  60 capsule  6   furosemide  (LASIX ) 20 MG tablet Take 1 tablet (20 mg total) by mouth daily as needed for edema. 30 tablet 3   GAVILAX 17 GM/SCOOP powder Take 17 g by mouth daily as needed for mild constipation.     glucose blood (ACCU-CHEK GUIDE TEST) test strip Use to check blood sugar 3 times daily. 300 each 1   hydrALAZINE  (APRESOLINE ) 10 MG tablet Take 1 tablet (10 mg total) by mouth in the morning and at bedtime. 180 tablet 1   insulin  glargine, 2 Unit Dial , (TOUJEO  MAX  SOLOSTAR) 300 UNIT/ML Solostar Pen Inject 74 Units into the skin daily. 27 mL 1   insulin  lispro (HUMALOG ) 100 UNIT/ML KwikPen Inject 32 Units into the skin 3 (three) times daily. 90 mL 1   Insulin  Pen Needle (BD PEN NEEDLE NANO 2ND GEN) 32G X 4 MM MISC Use to inject insulin  4 times daily. 400 each 1   montelukast  (SINGULAIR ) 10 MG tablet TAKE 1 TABLET BY MOUTH EVERY DAY 90 tablet 0   naratriptan  (AMERGE) 2.5 MG tablet TAKE 1 TABLET BY MOUTH AS NEEDED FOR MIGRAINE. 10 tablet 1   nystatin cream (MYCOSTATIN) Apply 1 application topically 3 (three) times daily as needed for dry skin.     oxyCODONE  (OXY IR/ROXICODONE ) 5 MG immediate release tablet Take 5 mg by mouth in the morning, at noon, and at bedtime.     PROAIR  HFA 108 (90 Base) MCG/ACT inhaler Inhale 2 puffs into the lungs every 6 (six) hours as needed for wheezing or shortness of breath. Note to pharmacy: Dispense as written.  No substitution allowed. 8.5 g 6   triamcinolone  cream (KENALOG ) 0.1 % APPLY TO AFFECTED AREA TWICE A DAY 30 g 0   valsartan  (DIOVAN ) 80 MG tablet Take 1 tablet (80 mg total) by mouth daily. Patient intolerant of the 160 mg dose 90 tablet 1   azithromycin  (ZITHROMAX ) 250 MG tablet Take 2 on day one then 1 daily x 4 days 6 tablet 0   Blood Pressure Monitor DEVI UAD to check blood pressure 1 each 0   diclofenac  Sodium (VOLTAREN ) 1 % GEL Apply 2 g topically 4 (four) times daily. 50 g 0   naloxone  (NARCAN ) nasal spray 4 mg/0.1 mL USE AS DIRECTED 2 each 0   topiramate  (TOPAMAX ) 25 MG tablet Take 50 mg by mouth 2 (two) times daily. (Patient not taking: Reported on 09/15/2023)     No current facility-administered medications on file prior to visit.    Allergies  Allergen Reactions   Keflex [Cephalexin] Nausea And Vomiting   Metformin And Related    Trulicity [Dulaglutide] Nausea And Vomiting   Aspirin Nausea And Vomiting and Rash   Morphine And Codeine Itching and Rash   Penicillins Nausea And Vomiting and Rash     Social History   Socioeconomic History   Marital status: Single    Spouse name: Not on file   Number of children: Not on file   Years of education: Not on file   Highest education level: 12th grade  Occupational History   Not on file  Tobacco Use   Smoking status: Never   Smokeless tobacco: Never  Vaping Use   Vaping status: Never Used  Substance and Sexual Activity   Alcohol  use: Not Currently   Drug use: Never   Sexual activity: Not on file  Other Topics Concern   Not on file  Social History Narrative   Not on file   Social Drivers of  Health   Financial Resource Strain: Low Risk  (07/28/2023)   Overall Financial Resource Strain (CARDIA)    Difficulty of Paying Living Expenses: Not hard at all  Food Insecurity: No Food Insecurity (07/04/2023)   Hunger Vital Sign    Worried About Running Out of Food in the Last Year: Never true    Ran Out of Food in the Last Year: Never true  Transportation Needs: No Transportation Needs (07/28/2023)   PRAPARE - Administrator, Civil Service (Medical): No    Lack of Transportation (Non-Medical): No  Physical Activity: Insufficiently Active (03/29/2023)   Exercise Vital Sign    Days of Exercise per Week: 3 days    Minutes of Exercise per Session: 30 min  Stress: No Stress Concern Present (07/28/2023)   [employer] of Occupational Health - Occupational Stress Questionnaire    Feeling of Stress: Not at all  Social Connections: Unknown (07/28/2023)   Social Connection and Isolation Panel    Frequency of Communication with Friends and Family: Once a week    Frequency of Social Gatherings with Friends and Family: Not on file    Attends Religious Services: Patient declined    Active Member of Clubs or Organizations: No    Attends Engineer, structural: Not on file    Marital Status: Never married  Recent Concern: Social Connections - Socially Isolated (07/04/2023)   Social Connection and Isolation Panel     Frequency of Communication with Friends and Family: Never    Frequency of Social Gatherings with Friends and Family: Once a week    Attends Religious Services: Never    Database administrator or Organizations: No    Attends Banker Meetings: Never    Marital Status: Never married  Intimate Partner Violence: Not At Risk (03/29/2023)   Humiliation, Afraid, Rape, and Kick questionnaire    Fear of Current or Ex-Partner: No    Emotionally Abused: No    Physically Abused: No    Sexually Abused: No    Family History  Problem Relation Age of Onset   Breast cancer Maternal Aunt     Past Surgical History:  Procedure Laterality Date   BREAST SURGERY     CHOLECYSTECTOMY     HIGH RISK BREAST EXCISION     KNEE SURGERY      ROS: Review of Systems Negative except as stated above  PHYSICAL EXAM: BP 138/82   Pulse 97   Temp 98.2 F (36.8 C) (Oral)   Resp 18   Ht 5' 1 (1.549 m)   Wt 191 lb 6.4 oz (86.8 kg)   SpO2 97%   BMI 36.16 kg/m   Physical Exam HENT:     Head: Normocephalic and atraumatic.     Nose: Nose normal.     Mouth/Throat:     Mouth: Mucous membranes are moist.     Pharynx: Oropharynx is clear.  Eyes:     Extraocular Movements: Extraocular movements intact.     Conjunctiva/sclera: Conjunctivae normal.     Pupils: Pupils are equal, round, and reactive to light.  Cardiovascular:     Rate and Rhythm: Normal rate and regular rhythm.     Pulses: Normal pulses.     Heart sounds: Normal heart sounds.  Pulmonary:     Effort: Pulmonary effort is normal.     Breath sounds: Normal breath sounds.  Musculoskeletal:        General: Normal range of motion.  Right shoulder: Normal.     Left shoulder: Normal.     Right upper arm: Normal.     Left upper arm: Normal.     Right elbow: Normal.     Left elbow: Normal.     Right forearm: Normal.     Left forearm: Normal.     Right wrist: Normal.     Left wrist: Normal.     Right hand: Normal.     Left hand:  Normal.     Cervical back: Normal, normal range of motion and neck supple.     Thoracic back: Normal.     Lumbar back: Normal.     Right hip: Normal.     Left hip: Normal.     Right upper leg: Normal.     Left upper leg: Normal.     Right knee: Normal.     Left knee: Normal.     Right lower leg: Normal.     Left lower leg: Normal.     Right ankle: Normal.     Left ankle: Normal.     Right foot: Normal.     Left foot: Normal.  Neurological:     General: No focal deficit present.     Mental Status: She is alert and oriented to person, place, and time.  Psychiatric:        Mood and Affect: Mood normal.        Behavior: Behavior normal.     ASSESSMENT AND PLAN: 1. Fall, subsequent encounter (Primary) - Gabapentin  as prescribed. Counseled on medication adherence/adverse effects.  - Referral to Physical Therapy for evaluation/management. - Follow-up with primary provider as scheduled.  - Ambulatory referral to Physical Therapy - gabapentin  (NEURONTIN ) 300 MG capsule; Take 1 capsule (300 mg total) by mouth 2 (two) times daily as needed.  Dispense: 180 capsule; Refill: 0  2. Type 2 diabetes mellitus with hyperglycemia, with long-term current use of insulin  (HCC) - Hemoglobin A1c result pending.  - Continue Insulin  Glargine and Insulin  Lispro as prescribed. No refills needed as of present.  - Routine screening.  - Discussed the importance of healthy eating habits, low-carbohydrate diet, low-sugar diet, regular aerobic exercise (at least 150 minutes a week as tolerated) and medication compliance to achieve or maintain control of diabetes. Counseled on medication adherence/adverse effects.  - Follow-up wit primary provider as scheduled.  - Basic Metabolic Panel - Hemoglobin A1c  3. Diabetic eye exam (HCC) - Keep all scheduled appointments with Ophthalmology.   Patient was given the opportunity to ask questions.  Patient verbalized understanding of the plan and was able to repeat  key elements of the plan. Patient was given clear instructions to go to Emergency Department or return to medical center if symptoms don't improve, worsen, or new problems develop.The patient verbalized understanding.   Orders Placed This Encounter  Procedures   Basic Metabolic Panel   Hemoglobin A1c   Ambulatory referral to Physical Therapy     Requested Prescriptions   Signed Prescriptions Disp Refills   gabapentin  (NEURONTIN ) 300 MG capsule 180 capsule 0    Sig: Take 1 capsule (300 mg total) by mouth 2 (two) times daily as needed.    Return for Follow-Up or next available with Barnie Louder, MD.  Greig JINNY Drones, NP

## 2023-10-19 ENCOUNTER — Ambulatory Visit

## 2023-10-20 DIAGNOSIS — S4981XA Other specified injuries of right shoulder and upper arm, initial encounter: Secondary | ICD-10-CM | POA: Diagnosis not present

## 2023-10-21 DIAGNOSIS — M1710 Unilateral primary osteoarthritis, unspecified knee: Secondary | ICD-10-CM | POA: Diagnosis not present

## 2023-10-21 DIAGNOSIS — M25561 Pain in right knee: Secondary | ICD-10-CM | POA: Diagnosis not present

## 2023-10-21 DIAGNOSIS — M25562 Pain in left knee: Secondary | ICD-10-CM | POA: Diagnosis not present

## 2023-10-24 ENCOUNTER — Other Ambulatory Visit: Payer: Self-pay | Admitting: Internal Medicine

## 2023-10-24 ENCOUNTER — Ambulatory Visit: Payer: Self-pay | Admitting: Family

## 2023-10-24 DIAGNOSIS — R600 Localized edema: Secondary | ICD-10-CM

## 2023-10-24 LAB — BASIC METABOLIC PANEL WITH GFR
BUN/Creatinine Ratio: 15 (ref 9–23)
BUN: 12 mg/dL (ref 6–24)
Calcium: 9.7 mg/dL (ref 8.7–10.2)
Chloride: 103 mmol/L (ref 96–106)
Creatinine, Ser: 0.81 mg/dL (ref 0.57–1.00)
Glucose: 160 mg/dL — ABNORMAL HIGH (ref 70–99)
Sodium: 140 mmol/L (ref 134–144)
eGFR: 86 mL/min/1.73 (ref >59)

## 2023-10-24 LAB — HEMOGLOBIN A1C
Est. average glucose Bld gHb Est-mCnc: 266 mg/dL
Hgb A1c MFr Bld: 10.9 % — ABNORMAL HIGH (ref 4.8–5.6)

## 2023-10-25 ENCOUNTER — Telehealth: Payer: Self-pay | Admitting: Oncology

## 2023-10-25 NOTE — Telephone Encounter (Signed)
 Rescheduled appointments per incoming call from the patient. Talked with the patient and she is aware of the changes made to her upcoming appointments.

## 2023-10-26 ENCOUNTER — Inpatient Hospital Stay: Admitting: Oncology

## 2023-10-26 ENCOUNTER — Inpatient Hospital Stay

## 2023-10-28 ENCOUNTER — Other Ambulatory Visit: Payer: Self-pay | Admitting: Internal Medicine

## 2023-10-28 DIAGNOSIS — K219 Gastro-esophageal reflux disease without esophagitis: Secondary | ICD-10-CM

## 2023-10-29 ENCOUNTER — Other Ambulatory Visit: Payer: Self-pay | Admitting: Internal Medicine

## 2023-10-29 DIAGNOSIS — J4540 Moderate persistent asthma, uncomplicated: Secondary | ICD-10-CM

## 2023-10-31 NOTE — Telephone Encounter (Signed)
 Requested Prescriptions  Pending Prescriptions Disp Refills   albuterol  (VENTOLIN  HFA) 108 (90 Base) MCG/ACT inhaler [Pharmacy Med Name: ALBUTEROL  HFA (PROAIR ) INHALER] 34 each 1    Sig: INHALE 2 PUFFS INTO THE LUNGS EVERY 6 HOURS AS NEEDED FOR WHEEZING/SHORTNESS OF BREATH     Pulmonology:  Beta Agonists 2 Passed - 10/31/2023 11:02 AM      Passed - Last BP in normal range    BP Readings from Last 1 Encounters:  10/18/23 138/82         Passed - Last Heart Rate in normal range    Pulse Readings from Last 1 Encounters:  10/18/23 97         Passed - Valid encounter within last 12 months    Recent Outpatient Visits           1 week ago Fall, subsequent encounter   [facility], Amy J, NP   1 month ago Hypertension associated with diabetes [facility])   [facility] [place]. [facility] Vicci Barnie NOVAK, MD   1 month ago Long term (current) use of oral hypoglycemic drugs   [facility] [place]. [facility] Fleeta Morris, [place] L, RPH-CPP   3 months ago Type 2 diabetes mellitus with hyperglycemia, with long-term current use of insulin  [facility])   [facility] [place]. [facility] Vicci Barnie B, MD   3 months ago Type 2 diabetes mellitus with hyperglycemia, with long-term current use of insulin  [facility])   [facility] Of [place]. [facility] Fleeta Morris Garnette LITTIE, RPH-CPP       Future Appointments             In 1 week Fleeta Morris, Garnette LITTIE, RPH-CPP [facility] Of [place]. [facility] [place]

## 2023-11-04 ENCOUNTER — Telehealth: Payer: Self-pay | Admitting: Internal Medicine

## 2023-11-04 NOTE — Telephone Encounter (Signed)
 Confirmed asppt for 9/29

## 2023-11-06 NOTE — Progress Notes (Unsigned)
 S:     No chief complaint on file.  53 y.o. female who presents for diabetes evaluation, education, and management. PMH is significant for T2DM w/ peripheral neuropathy, HTN, opiate dependence, anxiety disorder, asthma, migraines, OA knees, alopecia. Patient was referred by Primary Care Provider, Dr. Vicci, on 07/28/2023. At that visit, A1c was 9.5 (down from 10.2 prior).   Last saw pharmacy 10/12/23 via telephone visit. Since the pharmacy visit prior, patient reported taking more insulin  than prescribed. Likely as a result, her CGM reports were showing hypoglycemia events, typically in the morning. Insulin  regimen was adjusted according to hypoglycemia.   Patient had a follow up appointment 10/18/23 with Greig Drones, NP. A1c was drawn that day and resulted at 10.9% which is up from 9.5% 07/28/23.  Today, patient was contacted via telephone. Patient reported taking her insulin  as directed. Of note, she reports having episodes of hypoglycemia which were confirmed on her Libre AGP report. Reports feeling her lows with sweating, shaky, and nauseous. She will correct her lows with honey and orange juice. She also reports having issues with her Libre 3+ sensors and that they were falling off.   Current diabetes medications include: Toujeo  74 units, Humalog  32 units TID before meals Current hypertension medications include: amlodipine  5 mg, valsartan  80 mg, hydralazine  10 mg BID Current hyperlipidemia medications include: atorvastatin  10 mg  Patient reports hypoglycemic events.  Patient denies nocturia (nighttime urination).  Patient denies neuropathy (nerve pain). Patient denies visual changes. Patient reports self foot exams.   Insurance coverage: UHC Medicare + Medicaid    No relevant family hx No smoking hx    Patient reported 2-3 meals/day Breakfast: glucose shake, sometimes skips Lunch: grilled chicken, salad, vegetables Dinner: [place] tacos, [place] spaghetti Snacks: reports  usually not snacking, sometimes oranges or grapes Beverages: mostly water, coke zero   Exercise: walks around complex (15-20 mins) 3 times a week; starting physical therapy   O:    Libre3 CGM Download today 11/07/23 1 week report % Time CGM is active: 62% Average Glucose: 192 mg/dL Glucose Management Indicator: not enough information to assess Glucose Variability: 42% (goal <36%) Time in Goal:  - Time in range 70-180: 34% - Time above range: 57% (26% VERY High) - Time below range: 9%  Libre3 CGM Download today 10/11/23 - 11/07/23 4 week report % Time CGM is active: 88% Average Glucose: 228 mg/dL Glucose Management Indicator: 8.8  Glucose Variability: 35.1% (goal <36%) Time in Goal:  - Time in range 70-180: 29% - Time above range: 69% (40% VERY High) - Time below range: 2% Observed patterns:  Lab Results  Component Value Date   HGBA1C 10.9 (H) 10/18/2023   There were no vitals filed for this visit.  Lipid Panel     Component Value Date/Time   CHOL 128 07/28/2023 1219   TRIG 142 07/28/2023 1219   HDL 91 07/28/2023 1219   CHOLHDL 1.4 07/28/2023 1219   LDLCALC 14 07/28/2023 1219    Clinical Atherosclerotic Cardiovascular Disease (ASCVD): No  The ASCVD Risk score (Arnett DK, et al., 2019) failed to calculate for the following reasons:   The valid total cholesterol range is 130 to 320 mg/dL   A/P: Diabetes longstanding is currently uncontrolled. Most recent A1c increased to 10.9% from 9.5% back in June. Patient recently reports symptoms of hypoglycemia as indicated on her AGP report. Patient has had recurrent hypoglycemia throughout being followed by pharmacy. Most hypoglycemia episodes happen during the night and into the early  morning, even into breakfast time. Patient is able to verbalize appropriate hypoglycemia management plan. Medication adherence appears to be optimal. She confirmed timing of her medications and is not taking extra Humalog  at dinner time. She is likely  receiving too much basal insulin  which is bottoming her out in the early mornings. Toujeo  will be decreased to correct her hypoglycemia. Recommended drinking a Glucerna in the evening to provide protein before going to bed to further avoid hypoglycemia throughout the night and early mornings. Plan to follow up closely with her to monitor and adjust her insulin  regimen to avoid further hypoglycemia. -Decreased dose of basal insulin  Toujeo  (insulin  glargine) from 74 units to 68 units daily in the morning.  -Continued rapid insulin  Humalog  (insulin  lispro) 32 units TID 15 minutes before meals.   -Patient educated on purpose, proper use, and potential adverse effects of Toujeo  and Humalog .  -Extensively discussed pathophysiology of diabetes, recommended lifestyle interventions, dietary effects on blood sugar control.  -Counseled on s/sx of and management of hypoglycemia.  -Next A1c anticipated 01/2024.   Written patient instructions provided. Patient verbalized understanding of treatment plan.  Total time in face to face counseling 30 minutes.    Follow-up:  Pharmacist 2 weeks for telephone follow up PCP clinic visit not scheduled   Jenkins Graces, PharmD PGY1 Pharmacy Resident [phone]

## 2023-11-07 ENCOUNTER — Other Ambulatory Visit: Payer: Self-pay

## 2023-11-07 ENCOUNTER — Encounter: Payer: Self-pay | Admitting: Pharmacist

## 2023-11-07 ENCOUNTER — Ambulatory Visit: Attending: Internal Medicine | Admitting: Pharmacist

## 2023-11-07 DIAGNOSIS — E1165 Type 2 diabetes mellitus with hyperglycemia: Secondary | ICD-10-CM

## 2023-11-07 DIAGNOSIS — Z794 Long term (current) use of insulin: Secondary | ICD-10-CM

## 2023-11-07 MED ORDER — TOUJEO MAX SOLOSTAR 300 UNIT/ML ~~LOC~~ SOPN
68.0000 [IU] | PEN_INJECTOR | Freq: Every day | SUBCUTANEOUS | 1 refills | Status: DC
  Filled 2023-11-07: qty 21, 92d supply, fill #0

## 2023-11-09 ENCOUNTER — Inpatient Hospital Stay

## 2023-11-09 ENCOUNTER — Inpatient Hospital Stay: Admitting: Oncology

## 2023-11-09 ENCOUNTER — Telehealth: Payer: Self-pay

## 2023-11-09 NOTE — Telephone Encounter (Signed)
 Left a voicemail regarding patient's missed lab and Dr. Autumn appointments this morning.

## 2023-11-10 ENCOUNTER — Encounter: Payer: Self-pay | Admitting: Internal Medicine

## 2023-11-10 ENCOUNTER — Telehealth: Admitting: Internal Medicine

## 2023-11-10 ENCOUNTER — Ambulatory Visit: Admitting: Internal Medicine

## 2023-11-10 DIAGNOSIS — J45991 Cough variant asthma: Secondary | ICD-10-CM

## 2023-11-10 NOTE — Progress Notes (Signed)
 Laurie Andrews, female    DOB: [birth year]   MRN: [id number]   Brief patient profile:  84  yobf never smoker asthma for as long as she can remember   referred to pulmonary clinic 09/15/2023 by Clydene Louder  for asthma  moved to [place] around 2021      Pt not previously seen by PCCM service.    Saw allergy/ pulmonary allergy mold dust/ pets never on shots     History of Present Illness  09/15/2023  Pulmonary/ 1st office eval/Nakari Bracknell on [facility] Chief Complaint  Patient presents with   Consult    Pt states she has excessive mucus. She states that it is a green color. All throughout the day. Been present for months.,   Dyspnea:  [employer] shopping more than a few aisles slow speed/ some walking around appt complex x couple time as week  Cough: worse at hs and waking up many times x months transiently better with prednisone  > min mucoid sputum production with lots of throat clearing > cough  Sleep: flat bed with extra pillow sometimes has to prop up against wall for cough/ not breathing issues  SABA use: 2x daily  three times per week neb  Rec Zpak  Plan A = Automatic = Always=    Symbicort  80 (Breyna ) Take 2 puffs first thing in am and then another 2 puffs about 12 hours later.   Work on inhaler technique:   Plan B = Backup (to supplement plan A, not to replace it) Use your albuterol  inhaler as a rescue medication  Plan C = Crisis (instead of Plan B but only if Plan B stops working) - only use your albuterol  nebulizer if you first try Plan B  Also  Ok to try albuterol  15 min before an activity (on alternating days)  that you know would usually make you short of breath Nexium  Take 30- 60 min before your first and last meals of the day  Cough > mucinex dm  up to 1200 mg every 12 hour For drainage / throat tickle try take CHLORPHENIRAMINE  4 mg    11/10/2023  f/u ov/Allissa Albright re: cough variant asthma   maint on BREO  symbicort   did not work  No chief complaint on file.  Dyspnea:  walks around  complex 10-15 min and stops twice  Cough: tolerable  Sleeping: flat bed 2 pillows  resp cc  SABA use: 2 x per day/ no need for neb  02: none      No obvious day to day or daytime variability or assoc excess/ purulent sputum or mucus plugs or hemoptysis or cp or chest tightness, subjective wheeze or overt sinus or hb symptoms.    Also denies any obvious fluctuation of symptoms with weather or environmental changes or other aggravating or alleviating factors except as outlined above   No unusual exposure hx or h/o childhood pna or   knowledge of premature birth.  Current Allergies, Complete Past Medical History, Past Surgical History, Family History, and Social History were reviewed in [employer] record.  ROS  The following are not active complaints unless bolded Hoarseness, sore throat, dysphagia, dental problems, itching, sneezing,  nasal congestion or discharge of excess mucus or purulent secretions, ear ache,   fever, chills, sweats, unintended wt loss or wt gain, classically pleuritic or exertional cp,  orthopnea pnd or arm/hand swelling  or leg swelling, presyncope, palpitations, abdominal pain, anorexia, nausea, vomiting, diarrhea  or change in bowel habits or  change in bladder habits, change in stools or change in urine, dysuria, hematuria,  rash, arthralgias, visual complaints, headache, numbness, weakness or ataxia or problems with walking or coordination,  change in mood or  memory.        Current Meds-  - NOTE:   Unable to verify as accurately reflecting what pt takes    Medication Sig   Accu-Chek Softclix Lancets lancets USE AS INSTRUCTED   acetaminophen  (TYLENOL ) 500 MG tablet Take 1 tablet (500 mg total) by mouth every 6 (six) hours as needed.   albuterol  (PROVENTIL ) (2.5 MG/3ML) 0.083% nebulizer solution INHALE 3 ML BY NEBULIZATION EVERY 6 HOURS AS NEEDED FOR WHEEZING OR SHORTNESS OF BREATH   albuterol  (VENTOLIN  HFA) 108 (90 Base) MCG/ACT inhaler INHALE  2 PUFFS INTO THE LUNGS EVERY 6 HOURS AS NEEDED FOR WHEEZING/SHORTNESS OF BREATH   Alcohol  Swabs (ALCOHOL  PREP) 70 % PADS Use with 4 insulin  injections daily.   amitriptyline  (ELAVIL ) 100 MG tablet Take 100 mg by mouth as needed for sleep.   amLODipine  (NORVASC ) 5 MG tablet Take 1 tablet (5 mg total) by mouth daily.   atorvastatin  (LIPITOR) 10 MG tablet TAKE 1 TABLET BY MOUTH EVERY DAY   azelastine  (ASTELIN ) 0.1 % nasal spray Place 2 sprays into both nostrils 2 (two) times daily. Use in each nostril as directed   Blood Glucose Monitoring Suppl (ACCU-CHEK GUIDE) w/Device KIT Use to check blood sugar 3 times daily.    BREO  100 One click each am    cetirizine  (ZYRTEC ) 10 MG tablet Take 1 tablet (10 mg total) by mouth daily.   Cholecalciferol (VITAMIN D3) 25 MCG (1000 UT) capsule Take 1 capsule (1,000 Units total) by mouth daily.   Continuous Blood Gluc Receiver (FREESTYLE LIBRE 3 READER) DEVI 1 application  by Does not apply route in the morning, at noon, and at bedtime. Use to check blood sugar TID. E11.65   Continuous Glucose Sensor (FREESTYLE LIBRE 3 PLUS SENSOR) MISC USE TO CHECK BLOOD SUGAR THREE TIMES A DAY   cyclobenzaprine  (FEXMID ) 7.5 MG tablet Take 1 tablet (7.5 mg total) by mouth 2 (two) times daily as needed for muscle spasms.   esomeprazole  (NEXIUM ) 20 MG capsule TAKE 1 CAPSULE (20 MG TOTAL) BY MOUTH 2 (TWO) TIMES DAILY BEFORE A MEAL. STOP PANTOPRAZOLE    furosemide  (LASIX ) 20 MG tablet TAKE 1 TABLET (20 MG TOTAL) BY MOUTH DAILY AS NEEDED FOR EDEMA.   gabapentin  (NEURONTIN ) 300 MG capsule Take 1 capsule (300 mg total) by mouth 2 (two) times daily as needed.   GAVILAX 17 GM/SCOOP powder Take 17 g by mouth daily as needed for mild constipation.   glucose blood (ACCU-CHEK GUIDE TEST) test strip Use to check blood sugar 3 times daily.   hydrALAZINE  (APRESOLINE ) 10 MG tablet Take 1 tablet (10 mg total) by mouth in the morning and at bedtime.   insulin  glargine, 2 Unit Dial , (TOUJEO  MAX  SOLOSTAR) 300 UNIT/ML Solostar Pen Inject 68 Units into the skin daily.   insulin  lispro (HUMALOG ) 100 UNIT/ML KwikPen Inject 32 Units into the skin 3 (three) times daily.   Insulin  Pen Needle (BD PEN NEEDLE NANO 2ND GEN) 32G X 4 MM MISC Use to inject insulin  4 times daily.   montelukast  (SINGULAIR ) 10 MG tablet TAKE 1 TABLET BY MOUTH EVERY DAY   naratriptan  (AMERGE) 2.5 MG tablet TAKE 1 TABLET BY MOUTH AS NEEDED FOR MIGRAINE.   nystatin cream (MYCOSTATIN) Apply 1 application topically 3 (three) times daily as needed  for dry skin.   oxyCODONE  (OXY IR/ROXICODONE ) 5 MG immediate release tablet Take 5 mg by mouth in the morning, at noon, and at bedtime.   triamcinolone  cream (KENALOG ) 0.1 % APPLY TO AFFECTED AREA TWICE A DAY   valsartan  (DIOVAN ) 80 MG tablet Take 1 tablet (80 mg total) by mouth daily. Patient intolerant of the 160 mg dose         Past Medical History:  Diagnosis Date   Anxiety    Asthma    Diabetes mellitus without complication (HCC)    Hypertension       Objective:      Wt Readings from Last 3 Encounters:  10/18/23 191 lb 6.4 oz (86.8 kg)  10/10/23 189 lb (85.7 kg)  09/15/23 191 lb 12.8 oz (87 kg)     Virtual encounter   General appearance:    looks comfortable sitting in car on her way to rehab presently s conversational sob or spont cough/ good voice texture.     Assessment   Assessment & Plan Cough variant asthma Onset since childhood/ never smoker - 09/15/2023  After extensive coaching inhaler device,  effectiveness =    60% (short ti) try change breo to symbicort  80 with clear lungs and refractory cough > preferred BREO   Still using saba bid despite BREO worked better than symbicort  and unable to validate hfa technique but I suspect she's really not inhaling but swallowing it with perhaps some benefit from hfa saba she did not get from symbicort  but no need to change rx for now   Rec  F/u q 12 m  with all meds in hand using a trust but verify  approach to confirm accurate Medication  Reconciliation The principal here is that until we are certain that the  patients are doing what we've asked, it makes no sense to ask them to do more.   Total time for virtual meeting = 20 min      AVS  Patient Instructions  No change in medicatons  Please schedule a follow up visit in 12  months but call sooner if needed   Return with all meds    Ozell America, MD 11/12/2023

## 2023-11-10 NOTE — Patient Instructions (Addendum)
 No change in medicatons  Please schedule a follow up visit in 12  months but call sooner if needed   Return with all meds

## 2023-11-12 ENCOUNTER — Encounter: Payer: Self-pay | Admitting: Internal Medicine

## 2023-11-12 NOTE — Assessment & Plan Note (Addendum)
 Onset since childhood/ never smoker - 09/15/2023  After extensive coaching inhaler device,  effectiveness =    60% (short ti) try change breo to symbicort  80 with clear lungs and refractory cough > preferred BREO   Still using saba bid despite BREO worked better than symbicort  and unable to validate hfa technique but I suspect she's really not inhaling but swallowing it with perhaps some benefit from hfa saba she did not get from symbicort  but no need to change rx for now   Rec  F/u q 12 m  with all meds in hand using a trust but verify approach to confirm accurate Medication  Reconciliation The principal here is that until we are certain that the  patients are doing what we've asked, it makes no sense to ask them to do more.   Total time for virtual meeting = 20 min

## 2023-11-16 ENCOUNTER — Other Ambulatory Visit: Payer: Self-pay

## 2023-11-16 ENCOUNTER — Other Ambulatory Visit (HOSPITAL_COMMUNITY): Payer: Self-pay

## 2023-11-16 ENCOUNTER — Other Ambulatory Visit: Payer: Self-pay | Admitting: Pharmacist

## 2023-11-16 DIAGNOSIS — K219 Gastro-esophageal reflux disease without esophagitis: Secondary | ICD-10-CM

## 2023-11-16 DIAGNOSIS — J45991 Cough variant asthma: Secondary | ICD-10-CM

## 2023-11-16 DIAGNOSIS — W19XXXD Unspecified fall, subsequent encounter: Secondary | ICD-10-CM

## 2023-11-16 DIAGNOSIS — J4540 Moderate persistent asthma, uncomplicated: Secondary | ICD-10-CM

## 2023-11-16 DIAGNOSIS — E1165 Type 2 diabetes mellitus with hyperglycemia: Secondary | ICD-10-CM

## 2023-11-16 DIAGNOSIS — R600 Localized edema: Secondary | ICD-10-CM

## 2023-11-16 DIAGNOSIS — Z794 Long term (current) use of insulin: Secondary | ICD-10-CM

## 2023-11-16 DIAGNOSIS — I152 Hypertension secondary to endocrine disorders: Secondary | ICD-10-CM

## 2023-11-16 MED ORDER — MONTELUKAST SODIUM 10 MG PO TABS
10.0000 mg | ORAL_TABLET | Freq: Every day | ORAL | 0 refills | Status: AC
  Filled 2023-11-16 (×2): qty 90, 90d supply, fill #0

## 2023-11-16 MED ORDER — AMLODIPINE BESYLATE 5 MG PO TABS
5.0000 mg | ORAL_TABLET | Freq: Every day | ORAL | 1 refills | Status: DC
  Filled 2023-11-16 (×2): qty 90, 90d supply, fill #0

## 2023-11-16 MED ORDER — HYDRALAZINE HCL 10 MG PO TABS
10.0000 mg | ORAL_TABLET | Freq: Two times a day (BID) | ORAL | 1 refills | Status: DC
  Filled 2023-11-16 (×2): qty 180, 90d supply, fill #0

## 2023-11-16 MED ORDER — ATORVASTATIN CALCIUM 10 MG PO TABS
10.0000 mg | ORAL_TABLET | Freq: Every day | ORAL | 1 refills | Status: AC
  Filled 2023-11-16 (×2): qty 90, 90d supply, fill #0

## 2023-11-16 MED ORDER — ESOMEPRAZOLE MAGNESIUM 20 MG PO CPDR
20.0000 mg | DELAYED_RELEASE_CAPSULE | Freq: Two times a day (BID) | ORAL | 1 refills | Status: AC
  Filled 2023-11-16 (×2): qty 180, 90d supply, fill #0

## 2023-11-16 MED ORDER — GABAPENTIN 300 MG PO CAPS
300.0000 mg | ORAL_CAPSULE | Freq: Two times a day (BID) | ORAL | 0 refills | Status: DC | PRN
  Filled 2023-11-16 (×2): qty 180, 90d supply, fill #0

## 2023-11-16 MED ORDER — INSULIN LISPRO (1 UNIT DIAL) 100 UNIT/ML (KWIKPEN)
PEN_INJECTOR | SUBCUTANEOUS | 1 refills | Status: DC
Start: 2023-11-16 — End: 2023-12-22
  Filled 2023-11-16: qty 90, 88d supply, fill #0
  Filled 2023-11-16: qty 90, fill #0

## 2023-11-16 MED ORDER — ALBUTEROL SULFATE HFA 108 (90 BASE) MCG/ACT IN AERS
2.0000 | INHALATION_SPRAY | Freq: Four times a day (QID) | RESPIRATORY_TRACT | 1 refills | Status: DC | PRN
  Filled 2023-11-16: qty 54, fill #0
  Filled 2023-11-16: qty 54, 75d supply, fill #0
  Filled 2024-01-30: qty 20.1, 84d supply, fill #0

## 2023-11-16 MED ORDER — TOUJEO MAX SOLOSTAR 300 UNIT/ML ~~LOC~~ SOPN
68.0000 [IU] | PEN_INJECTOR | Freq: Every day | SUBCUTANEOUS | 1 refills | Status: DC
  Filled 2023-11-16: qty 21, 92d supply, fill #0

## 2023-11-16 MED ORDER — FREESTYLE LIBRE 3 PLUS SENSOR MISC
3 refills | Status: DC
  Filled 2023-11-16: qty 2, fill #0
  Filled 2023-11-16: qty 2, 30d supply, fill #0
  Filled 2023-11-17: qty 1, 14d supply, fill #0
  Filled 2023-11-23 (×2): qty 2, 30d supply, fill #0

## 2023-11-16 MED ORDER — ACETAMINOPHEN 500 MG PO TABS
500.0000 mg | ORAL_TABLET | Freq: Four times a day (QID) | ORAL | 1 refills | Status: AC | PRN
  Filled 2023-11-16 (×2): qty 90, 23d supply, fill #0

## 2023-11-16 MED ORDER — ALBUTEROL SULFATE (2.5 MG/3ML) 0.083% IN NEBU
2.5000 mg | INHALATION_SOLUTION | Freq: Four times a day (QID) | RESPIRATORY_TRACT | 0 refills | Status: AC | PRN
  Filled 2023-11-16: qty 150, fill #0
  Filled 2023-11-16: qty 150, 13d supply, fill #0

## 2023-11-16 MED ORDER — FUROSEMIDE 20 MG PO TABS
20.0000 mg | ORAL_TABLET | Freq: Every day | ORAL | 1 refills | Status: AC | PRN
  Filled 2023-11-16 (×2): qty 90, 90d supply, fill #0

## 2023-11-16 MED ORDER — BUDESONIDE-FORMOTEROL FUMARATE 80-4.5 MCG/ACT IN AERO
INHALATION_SPRAY | RESPIRATORY_TRACT | 12 refills | Status: AC
  Filled 2023-11-16: qty 10.2, 30d supply, fill #0
  Filled 2023-11-16: qty 1, fill #0

## 2023-11-16 MED ORDER — VALSARTAN 80 MG PO TABS
80.0000 mg | ORAL_TABLET | Freq: Every day | ORAL | 1 refills | Status: DC
  Filled 2023-11-16 (×2): qty 90, 90d supply, fill #0

## 2023-11-16 NOTE — Progress Notes (Signed)
 S:     No chief complaint on file.  53 y.o. female who presents for diabetes evaluation, education, and management. PMH is significant for T2DM w/ peripheral neuropathy, HTN, opiate dependence, anxiety disorder, asthma, migraines, OA knees, alopecia. Patient was referred by Primary Care Provider, Dr. Vicci, on 07/28/2023. At that visit, A1c was 9.5 (down from 10.2 prior).   Patient had a follow up appointment 10/18/23 with Greig Drones, NP. A1c was drawn that day and resulted at 10.9% (up from 9.5% 07/28/23).  Last saw pharmacy 11/07/23 via telephone visit. We had to decrease basal insulin  from 74 to 68 units d/t nocturnal/early AM hypoglycemia. We continued her bolus insulin  at 32 units TID before meals.  Today, patient was contacted via telephone. Patient reports taking her insulin  as directed. Of note, her AGP report is listed below. Since reducing Toujeo  dose last week, hypoglycemia has resolved. She is still having post-prandial hyperglycemia with most of her highs occurring during the afternoon after lunch.  Current diabetes medications include: Toujeo  68 units, Humalog  32 units TID before meals Current hypertension medications include: amlodipine  5 mg, valsartan  80 mg, hydralazine  10 mg BID Current hyperlipidemia medications include: atorvastatin  10 mg  Patient reports hypoglycemic events.  Patient denies nocturia (nighttime urination).  Patient denies neuropathy (nerve pain). Patient denies visual changes. Patient reports self foot exams.   Insurance coverage: UHC Medicare + Medicaid    No relevant family hx No smoking hx    Patient reported 2-3 meals/day Breakfast: glucose shake, sometimes skips Lunch: grilled chicken, salad, vegetables Dinner: [place] tacos, [place] spaghetti Snacks: reports usually not snacking, sometimes oranges or grapes Beverages: mostly water, coke zero   Exercise: walks around complex (15-20 mins) 3 times a week; starting physical therapy   O:   Libre3 CGM Download today for 1 week report % Time CGM is active: 93% Average Glucose: 235 (was 192 mg/dL) Glucose Management Indicator: 8.9 Glucose Variability: 32.7%, down from >40% last week (goal <36%) Time in Goal:  - Time in range 70-180: 25% - Time above range: 75% - Time below range: 0%  Lab Results  Component Value Date   HGBA1C 10.9 (H) 10/18/2023   There were no vitals filed for this visit.  Lipid Panel     Component Value Date/Time   CHOL 128 07/28/2023 1219   TRIG 142 07/28/2023 1219   HDL 91 07/28/2023 1219   CHOLHDL 1.4 07/28/2023 1219   LDLCALC 14 07/28/2023 1219    Clinical Atherosclerotic Cardiovascular Disease (ASCVD): No  The ASCVD Risk score (Arnett DK, et al., 2019) failed to calculate for the following reasons:   The valid total cholesterol range is 130 to 320 mg/dL   A/P: Diabetes longstanding, currently uncontrolled. Most recent A1c increased to 10.9%. AGP report shows GMI that is still elevated but improved from 10.9%. Her hypoglycemia has resolved since decreasing Toujeo  dose. According to her AGP report, she is having post-prandial hyperglycemia, with most levels spiking in the 12p - 3p time period. Medication adherence appears to be optimal. She confirmed timing of her medications and is not taking extra Humalog  at dinner time.  -Continue Toujeo  (insulin  glargine) 68 units daily in the morning.  -INCREASE Humalog  (insulin  lispro) dose before lunch. Take 38 units before lunch. Continue 32 units before breakfast and dinner.   -Patient educated on purpose, proper use, and potential adverse effects of Toujeo  and Humalog .  -Extensively discussed pathophysiology of diabetes, recommended lifestyle interventions, dietary effects on blood sugar control.  -Counseled  on s/sx of and management of hypoglycemia.  -Next A1c anticipated 01/2024.   Written patient instructions provided. Patient verbalized understanding of treatment plan.  Total time in face to  face counseling 30 minutes.    Follow-up:  Pharmacist in person next month.   Herlene Fleeta Morris, PharmD, JAQUELINE, CPP Clinical Pharmacist [facility] [phone]

## 2023-11-17 ENCOUNTER — Other Ambulatory Visit: Payer: Self-pay

## 2023-11-17 ENCOUNTER — Other Ambulatory Visit (HOSPITAL_COMMUNITY): Payer: Self-pay

## 2023-11-23 ENCOUNTER — Other Ambulatory Visit (HOSPITAL_COMMUNITY): Payer: Self-pay

## 2023-11-23 ENCOUNTER — Other Ambulatory Visit: Payer: Self-pay

## 2023-11-24 ENCOUNTER — Other Ambulatory Visit: Payer: Self-pay

## 2023-11-24 DIAGNOSIS — M1710 Unilateral primary osteoarthritis, unspecified knee: Secondary | ICD-10-CM | POA: Diagnosis not present

## 2023-11-24 DIAGNOSIS — M25561 Pain in right knee: Secondary | ICD-10-CM | POA: Diagnosis not present

## 2023-11-24 DIAGNOSIS — M545 Low back pain, unspecified: Secondary | ICD-10-CM | POA: Diagnosis not present

## 2023-11-30 ENCOUNTER — Encounter: Payer: Self-pay | Admitting: Podiatry

## 2023-11-30 ENCOUNTER — Ambulatory Visit (INDEPENDENT_AMBULATORY_CARE_PROVIDER_SITE_OTHER)

## 2023-11-30 ENCOUNTER — Ambulatory Visit: Admitting: Podiatry

## 2023-11-30 DIAGNOSIS — S9031XA Contusion of right foot, initial encounter: Secondary | ICD-10-CM | POA: Diagnosis not present

## 2023-11-30 DIAGNOSIS — S92514A Nondisplaced fracture of proximal phalanx of right lesser toe(s), initial encounter for closed fracture: Secondary | ICD-10-CM

## 2023-11-30 DIAGNOSIS — S92354A Nondisplaced fracture of fifth metatarsal bone, right foot, initial encounter for closed fracture: Secondary | ICD-10-CM | POA: Diagnosis not present

## 2023-11-30 NOTE — Progress Notes (Signed)
 Subjective:   Patient ID: Laurie Andrews, female   DOB: 53 y.o.   MRN: [id number]   HPI Patient states she fell 7 weeks ago and hurt her right foot.  She has been seeing a chiropractor for other issues associated with that but the foot has been sore and swollen and hard to walk on   ROS      Objective:  Physical Exam  Neurovascular status unchanged with edema in the right forefoot mostly centered around the fifth metatarsal and fifth digit.  Patient has good digital perfusion well-oriented     Assessment:  Cannot rule out the possibility for trauma to the right foot secondary to injury sustained on September 1     Plan:  H&P x-rays reviewed discussed fracture of the right fifth digit right fifth metatarsal with what appears to be bone healing gradually occurring.  I did apply a air fracture walker to immobilize the foot and take pressure off of this and advised her on elevation also.  Patient will be seen back in 3 to 4 weeks and hopefully we will see further healing of bone  X-rays indicate the probability for fracture of the head of proximal phalanx digit 5 right and a neck fracture of the fifth metatarsal right stable

## 2023-12-02 ENCOUNTER — Other Ambulatory Visit: Payer: Self-pay | Admitting: Internal Medicine

## 2023-12-02 DIAGNOSIS — E1165 Type 2 diabetes mellitus with hyperglycemia: Secondary | ICD-10-CM

## 2023-12-03 NOTE — Telephone Encounter (Signed)
 Requested medication (s) are due for refill today: Yes  Requested medication (s) are on the active medication list: Yes  Last refill:  11/16/23  Future visit scheduled: Yes  Notes to clinic:  Unable to refill due to no refill protocol for this medication.      Requested Prescriptions  Pending Prescriptions Disp Refills   Continuous Glucose Sensor (FREESTYLE LIBRE 3 PLUS SENSOR) MISC [Pharmacy Med Name: FREESTYLE LIBRE 3 PLUS SENSOR] 2 each 3    Sig: USE TO CHECK BLOOD SUGAR THREE TIMES A DAY     There is no refill protocol information for this order

## 2023-12-12 ENCOUNTER — Encounter: Payer: Self-pay | Admitting: Radiology

## 2023-12-20 ENCOUNTER — Ambulatory Visit: Admitting: Pharmacist

## 2023-12-21 ENCOUNTER — Ambulatory Visit: Admitting: Podiatry

## 2023-12-21 ENCOUNTER — Telehealth: Payer: Self-pay | Admitting: Pharmacist

## 2023-12-21 ENCOUNTER — Ambulatory Visit

## 2023-12-21 DIAGNOSIS — S92354G Nondisplaced fracture of fifth metatarsal bone, right foot, subsequent encounter for fracture with delayed healing: Secondary | ICD-10-CM | POA: Diagnosis not present

## 2023-12-21 DIAGNOSIS — S9031XA Contusion of right foot, initial encounter: Secondary | ICD-10-CM | POA: Diagnosis not present

## 2023-12-21 NOTE — Telephone Encounter (Signed)
 This patient missed her appt yesterday with me. Can we try to call her to reschedule?

## 2023-12-22 ENCOUNTER — Other Ambulatory Visit: Payer: Self-pay

## 2023-12-22 ENCOUNTER — Ambulatory Visit: Attending: Family Medicine | Admitting: Pharmacist

## 2023-12-22 ENCOUNTER — Other Ambulatory Visit (HOSPITAL_COMMUNITY): Payer: Self-pay

## 2023-12-22 DIAGNOSIS — Z794 Long term (current) use of insulin: Secondary | ICD-10-CM | POA: Diagnosis not present

## 2023-12-22 DIAGNOSIS — E1165 Type 2 diabetes mellitus with hyperglycemia: Secondary | ICD-10-CM | POA: Diagnosis not present

## 2023-12-22 MED ORDER — INSULIN LISPRO (1 UNIT DIAL) 100 UNIT/ML (KWIKPEN)
38.0000 [IU] | PEN_INJECTOR | Freq: Three times a day (TID) | SUBCUTANEOUS | 1 refills | Status: AC
  Filled 2023-12-22: qty 90, 79d supply, fill #0

## 2023-12-22 MED ORDER — BD PEN NEEDLE NANO 2ND GEN 32G X 4 MM MISC
1 refills | Status: AC
  Filled 2023-12-22: qty 400, fill #0
  Filled 2023-12-22: qty 400, 100d supply, fill #0

## 2023-12-22 MED ORDER — TOUJEO MAX SOLOSTAR 300 UNIT/ML ~~LOC~~ SOPN
65.0000 [IU] | PEN_INJECTOR | Freq: Every day | SUBCUTANEOUS | 1 refills | Status: AC
  Filled 2023-12-22 (×2): qty 21, 96d supply, fill #0

## 2023-12-22 MED ORDER — ONDANSETRON 4 MG PO TBDP
4.0000 mg | ORAL_TABLET | Freq: Three times a day (TID) | ORAL | 1 refills | Status: AC | PRN
  Filled 2023-12-22: qty 20, 7d supply, fill #0

## 2023-12-22 MED ORDER — FREESTYLE LIBRE 3 PLUS SENSOR MISC
3 refills | Status: AC
  Filled 2023-12-22 (×2): qty 2, 30d supply, fill #0
  Filled 2023-12-23: qty 2, 28d supply, fill #0

## 2023-12-22 NOTE — Progress Notes (Signed)
 S:     No chief complaint on file.  53 y.o. female who presents for diabetes evaluation, education, and management. PMH is significant for T2DM w/ peripheral neuropathy, HTN, opiate dependence, anxiety disorder, asthma, migraines, OA knees, alopecia. Patient was referred by Primary Care Provider, Dr. Vicci, on 09/09/23.   Last saw pharmacy 11/07/23. At that visit, we decreased her basal insulin  from 74 to 68 units daily due to AM hypoglycemia. Humalog  continued at 32 units TID before meals.  Today, patient presents in good spirits. She reports taking her insulin  as directed. Of note, her episodes of hypoglycemia have improved. When pharmacy saw her in September, she was low ~9% of the week prior to her visit. Her CGM report is listed below, but her hypoglycemia incidence has decreased to ~1% of the time.   Current diabetes medications include: Toujeo  68 units, Humalog  32 units TID before meals (usually takes BID as she only eats prior to meals) Current hypertension medications include: amlodipine  5 mg, valsartan  80 mg, hydralazine  10 mg BID Current hyperlipidemia medications include: atorvastatin  10 mg  Patient reports hypoglycemic events.  Patient denies nocturia (nighttime urination).  Patient denies neuropathy (nerve pain). Patient denies visual changes. Patient reports self foot exams.   Insurance coverage: UHC Medicare + Medicaid    No relevant family hx No smoking hx    Patient reported 2-3 meals/day Breakfast: glucose shake, sometimes skips Lunch: grilled chicken, salad, vegetables Dinner: Turkey tacos, turkey spaghetti Snacks: reports usually not snacking, sometimes oranges or grapes Beverages: mostly water, coke zero   Exercise: walks around complex (15-20 mins) 3 times a week; starting physical therapy   O:  Libre3 CGM Download today for a 4 week report % Time CGM is active: 80% Average Glucose: 251 mg/dL Glucose Management Indicator: 9.3 Glucose Variability:  35.9% (goal <36%) Time in Goal:  - Time in range 70-180: 25% - Time above range: 74% (53% VERY High) - Time below range: 1%  Lab Results  Component Value Date   HGBA1C 10.9 (H) 10/18/2023   There were no vitals filed for this visit.  Lipid Panel     Component Value Date/Time   CHOL 128 07/28/2023 1219   TRIG 142 07/28/2023 1219   HDL 91 07/28/2023 1219   CHOLHDL 1.4 07/28/2023 1219   LDLCALC 14 07/28/2023 1219    Clinical Atherosclerotic Cardiovascular Disease (ASCVD): No  The ASCVD Risk score (Arnett DK, et al., 2019) failed to calculate for the following reasons:   The valid total cholesterol range is 130 to 320 mg/dL   A/P: Diabetes longstanding is currently uncontrolled. Most recent A1c increased to 10.9% from 9.5% back in June. We adjusted her Toujeo  dose in September and her hypoglycemia has nearly resolved. When looking at her AGP report, her spikes occur around 1p and 6p. When questioned, these are her most consistent meal times. We will increase her bolus insulin  and keep Toujeo  the same.  Patient is able to verbalize appropriate hypoglycemia management plan. Medication adherence appears to be optimal.  -Continued Toujeo  (insulin  glargine) from 74 units to 68 units daily in the morning.  -INCREASE Humalog  (insulin  lispro) TO 38 units TID 15 minutes before meals.   -Patient educated on purpose, proper use, and potential adverse effects of Toujeo  and Humalog .  -Extensively discussed pathophysiology of diabetes, recommended lifestyle interventions, dietary effects on blood sugar control.  -Counseled on s/sx of and management of hypoglycemia.  -Next A1c anticipated 01/2024.   Written patient instructions provided. Patient  verbalized understanding of treatment plan.  Total time in face to face counseling 30 minutes.    Follow-up:  Endo 02/06/24 Me: 2 MONTHS  Herlene Fleeta Morris, PharmD, [place], CPP Clinical Pharmacist [facility] [phone]

## 2023-12-22 NOTE — Progress Notes (Signed)
 Subjective:   Patient ID: Laurie Andrews, female   DOB: 53 y.o.   MRN: [id number]   HPI Patient states she is somewhat improved but still having a lot of pain in the right lateral foot and feels like she also has some numbness   ROS      Objective:  Physical Exam  Neurovascular status intact muscle strength adequate patient is noted to have discomfort in the right fifth metatarsal and fifth digit with history of fracture that occurred recently     Assessment:  Fracture of the right fifth digit right fifth metatarsal that is somewhat delayed in healing     Plan:  H&P x-ray reviewed and I went ahead today and advised on continuation of boot usage I discussed gradual increase in shoe gear usage start in about 2 weeks and reappoint again in 4 weeks and we will reevaluate eventually may require a bone stimulator if were not able to get healing  X-rays continue to exhibit lucency around the fifth metatarsal head right and the proximal phalanx distal digit 5 right from previous fracture from trauma

## 2023-12-23 ENCOUNTER — Other Ambulatory Visit: Payer: Self-pay

## 2023-12-23 ENCOUNTER — Other Ambulatory Visit (HOSPITAL_COMMUNITY): Payer: Self-pay

## 2023-12-30 ENCOUNTER — Telehealth: Admitting: Family Medicine

## 2023-12-30 ENCOUNTER — Ambulatory Visit: Payer: Self-pay

## 2023-12-30 DIAGNOSIS — R21 Rash and other nonspecific skin eruption: Secondary | ICD-10-CM

## 2023-12-30 MED ORDER — PREDNISONE 20 MG PO TABS: 20.0000 mg | ORAL_TABLET | Freq: Two times a day (BID) | ORAL | 0 refills | Status: AC

## 2023-12-30 NOTE — Patient Instructions (Signed)
 Rash, Adult A rash is a breakout of spots or blotches on the skin. It can affect the way the skin looks and feels. Many things can cause a rash. Common causes include: Viral infections. These include colds, measles, and hand, foot, and mouth disease. Bacterial infections. These include scarlet fever and impetigo. Fungal infections. These include athlete's foot, ringworm, and yeast rashes. Skin irritation. This may be from heat rash, exposure to moisture or friction for a long time (intertrigo), or exposure to soap or skin care products (eczema). Allergic reactions. These may be caused by foods, medicines, or things like poison ivy. Some rashes may go away after a few days. Others may last for a few weeks. The goal of treatment is to stop the itching and keep the rash from spreading. Follow these instructions at home: Medicine Take or apply over-the-counter and prescription medicines only as told by your health care provider. These may include: Corticosteroids. These can help treat red or swollen skin. They may be given as creams or as medicines to take by mouth (orally). Anti-itch lotions. Allergy medicines. Pain medicine. Antifungal medicine if the rash is from a fungal infection. Antibiotics if you have an infection.  Skin care Apply cool, wet cloths (compresses) to the affected areas. Do not scratch or rub your skin. Avoid covering the rash. Keep it exposed to air as often as you can. Managing itching and discomfort Avoid hot showers and baths. These can make itching worse. A cold shower may help. Try taking a bath with: Epsom salts. You can get these at your local pharmacy or grocery store. Follow the instructions on the package. Baking soda. Pour a small amount into the bath as told by your provider. Colloidal oatmeal. You can get this at your local pharmacy or grocery store. Follow the instructions on the package. Try putting baking soda paste on your skin. Stir water into baking  soda until it becomes like a paste. Try using calamine lotion or cortisone cream to help with itchiness. Keep cool. Stay out of the sun. Sweating and being hot can make itching worse. General instructions  Rest as needed. Drink enough fluid to keep your pee (urine) pale yellow. Wear loose-fitting clothes. Avoid scented soaps, detergents, and perfumes. Use gentle soaps, detergents, perfumes, and cosmetics. Avoid the things that cause your rash (triggers). Keep a journal to help keep track of your triggers. Write down: What you eat. What cosmetics you use. What you drink. What you wear. This includes jewelry. Contact a health care provider if: You sweat at night more than normal. You pee (urinate) more or less than normal, or your pee is a darker color than normal. Your eyes become sensitive to light. Your skin or the white parts of your eyes turn yellow (jaundice). Your skin tingles or is numb. You get painful blisters in your nose or mouth. Your rash does not go away after a few days, or it gets worse. You are more tired or thirsty than normal. You have new or worse symptoms. These may include: Pain in your abdomen. Fever. Diarrhea or vomiting. Weakness or weight loss. Get help right away if: You get confused. You have a severe headache, a stiff neck, or severe joint pain or stiffness. You become very sleepy or not responsive. You have a seizure. This information is not intended to replace advice given to you by your health care provider. Make sure you discuss any questions you have with your health care provider. Document Revised: 11/13/2021 Document Reviewed:  11/13/2021 Elsevier Patient Education  2024 [employer].

## 2023-12-30 NOTE — Progress Notes (Signed)
 Virtual Visit Consent   Laurie Andrews, you are scheduled for a virtual visit with a [facility] provider today. Just as with appointments in the office, your consent must be obtained to participate. Your consent will be active for this visit and any virtual visit you may have with one of our providers in the next 365 days. If you have a MyChart account, a copy of this consent can be sent to you electronically.  As this is a virtual visit, video technology does not allow for your provider to perform a traditional examination. This may limit your provider's ability to fully assess your condition. If your provider identifies any concerns that need to be evaluated in person or the need to arrange testing (such as labs, EKG, etc.), we will make arrangements to do so. Although advances in technology are sophisticated, we cannot ensure that it will always work on either your end or our end. If the connection with a video visit is poor, the visit may have to be switched to a telephone visit. With either a video or telephone visit, we are not always able to ensure that we have a secure connection.  By engaging in this virtual visit, you consent to the provision of healthcare and authorize for your insurance to be billed (if applicable) for the services provided during this visit. Depending on your insurance coverage, you may receive a charge related to this service.  I need to obtain your verbal consent now. Are you willing to proceed with your visit today? Ashika Apuzzo has provided verbal consent on 12/30/2023 for a virtual visit (video or telephone). Loa Lamp, FNP  Date: 12/30/2023 3:11 PM   Virtual Visit via Video Note   I, Loa Lamp, connected with  Laurie Andrews  ([id number], [birth year]) on 12/30/23 at  3:00 PM EST by a video-enabled telemedicine application and verified that I am speaking with the correct person using two identifiers.  Location: Patient: Virtual Visit Location Patient:  Home Provider: Virtual Visit Location Provider: Home Office   I discussed the limitations of evaluation and management by telemedicine and the availability of in person appointments. The patient expressed understanding and agreed to proceed.    History of Present Illness: Laurie Andrews is a 53 y.o. who identifies as a female who was assigned female at birth, and is being seen today for rash all over that was present, went away and now has come back. She does not know the cause. It itches. No fever. She is diabetic. She says hydrocortisone and triamcinolone  are not helping. SABRA  HPI: HPI  Problems:  Patient Active Problem List   Diagnosis Date Noted   Cough variant asthma 09/15/2023   Hyperlipidemia 07/25/2021   Influenza vaccine needed 05/08/2020   Obesity (BMI 30.0-34.9) 05/08/2020   Moderate persistent asthma without complication 05/08/2020   Post-traumatic osteoarthritis of both knees 05/08/2020   Acute pyelonephritis 04/09/2020   Constipation 04/09/2020   Type 2 diabetes mellitus with hyperglycemia (HCC) 04/08/2020   Essential hypertension 04/08/2020    Allergies:  Allergies  Allergen Reactions   Keflex [Cephalexin] Nausea And Vomiting   Metformin And Related    Trulicity [Dulaglutide] Nausea And Vomiting   Aspirin Nausea And Vomiting and Rash   Morphine And Codeine Itching and Rash   Penicillins Nausea And Vomiting and Rash   Medications:  Current Outpatient Medications:    Accu-Chek Softclix Lancets lancets, USE AS INSTRUCTED, Disp: 300 each, Rfl: 2   acetaminophen  (TYLENOL ) 500 MG tablet, Take 1 tablet (  500 mg total) by mouth every 6 (six) hours as needed., Disp: 90 tablet, Rfl: 1   albuterol  (PROVENTIL ) (2.5 MG/3ML) 0.083% nebulizer solution, INHALE 3 ML BY NEBULIZATION EVERY 6 HOURS AS NEEDED FOR WHEEZING OR SHORTNESS OF BREATH, Disp: 150 mL, Rfl: 0   albuterol  (VENTOLIN  HFA) 108 (90 Base) MCG/ACT inhaler, Inhale 2 puffs into the lungs every 6 (six) hours as needed for  wheezing or shortness of breath., Disp: 54 g, Rfl: 1   Alcohol  Swabs (ALCOHOL  PREP) 70 % PADS, Use with 4 insulin  injections daily., Disp: 400 each, Rfl: 1   amitriptyline  (ELAVIL ) 100 MG tablet, Take 100 mg by mouth as needed for sleep., Disp: , Rfl:    amLODipine  (NORVASC ) 5 MG tablet, Take 1 tablet (5 mg total) by mouth daily., Disp: 90 tablet, Rfl: 1   atorvastatin  (LIPITOR) 10 MG tablet, Take 1 tablet (10 mg total) by mouth daily., Disp: 90 tablet, Rfl: 1   azelastine  (ASTELIN ) 0.1 % nasal spray, Place 2 sprays into both nostrils 2 (two) times daily. Use in each nostril as directed, Disp: 90 mL, Rfl: 1   Blood Glucose Monitoring Suppl (ACCU-CHEK GUIDE) w/Device KIT, Use to check blood sugar 3 times daily., Disp: 1 kit, Rfl: 0   budesonide -formoterol  (SYMBICORT ) 80-4.5 MCG/ACT inhaler, Take 2 puffs first thing in morning and then another 2 puffs about 12 hours later., Disp: 10.2 g, Rfl: 12   cetirizine  (ZYRTEC ) 10 MG tablet, Take 1 tablet (10 mg total) by mouth daily., Disp: 30 tablet, Rfl: 11   Cholecalciferol (VITAMIN D3) 25 MCG (1000 UT) capsule, Take 1 capsule (1,000 Units total) by mouth daily., Disp: 100 capsule, Rfl: 1   Continuous Blood Gluc Receiver (FREESTYLE LIBRE 3 READER) DEVI, 1 application  by Does not apply route in the morning, at noon, and at bedtime. Use to check blood sugar TID. E11.65, Disp: 1 each, Rfl: 0   Continuous Glucose Sensor (FREESTYLE LIBRE 3 PLUS SENSOR) MISC, USE TO CHECK BLOOD SUGAR THREE TIMES A DAY, Disp: 2 each, Rfl: 3   cyclobenzaprine  (FEXMID ) 7.5 MG tablet, Take 1 tablet (7.5 mg total) by mouth 2 (two) times daily as needed for muscle spasms., Disp: 10 tablet, Rfl: 0   esomeprazole  (NEXIUM ) 20 MG capsule, Take 1 capsule (20 mg total) by mouth 2 (two) times daily before a meal. Stop Pantoprazole , Disp: 180 capsule, Rfl: 1   furosemide  (LASIX ) 20 MG tablet, Take 1 tablet (20 mg total) by mouth daily as needed for edema., Disp: 90 tablet, Rfl: 1   GAVILAX 17  GM/SCOOP powder, Take 17 g by mouth daily as needed for mild constipation., Disp: , Rfl:    glucose blood (ACCU-CHEK GUIDE TEST) test strip, Use to check blood sugar 3 times daily., Disp: 300 each, Rfl: 1   hydrALAZINE  (APRESOLINE ) 10 MG tablet, Take 1 tablet (10 mg total) by mouth in the morning and at bedtime., Disp: 180 tablet, Rfl: 1   insulin  glargine, 2 Unit Dial , (TOUJEO  MAX SOLOSTAR) 300 UNIT/ML Solostar Pen, Inject 65 Units into the skin daily., Disp: 21 mL, Rfl: 1   insulin  lispro (HUMALOG ) 100 UNIT/ML KwikPen, Inject 38 Units into the skin 3 (three) times daily., Disp: 90 mL, Rfl: 1   Insulin  Pen Needle (BD PEN NEEDLE NANO 2ND GEN) 32G X 4 MM MISC, Use to inject insulin  4 times daily., Disp: 400 each, Rfl: 1   montelukast  (SINGULAIR ) 10 MG tablet, Take 1 tablet (10 mg total) by mouth daily., Disp: 90 tablet,  Rfl: 0   naratriptan  (AMERGE) 2.5 MG tablet, TAKE 1 TABLET BY MOUTH AS NEEDED FOR MIGRAINE., Disp: 10 tablet, Rfl: 1   nystatin cream (MYCOSTATIN), Apply 1 application topically 3 (three) times daily as needed for dry skin., Disp: , Rfl:    ondansetron  (ZOFRAN -ODT) 4 MG disintegrating tablet, Take 1 tablet (4 mg total) by mouth every 8 (eight) hours as needed for nausea or vomiting., Disp: 20 tablet, Rfl: 1   oxyCODONE  (OXY IR/ROXICODONE ) 5 MG immediate release tablet, Take 5 mg by mouth in the morning, at noon, and at bedtime., Disp: , Rfl:    triamcinolone  cream (KENALOG ) 0.1 %, APPLY TO AFFECTED AREA TWICE A DAY, Disp: 30 g, Rfl: 0   valsartan  (DIOVAN ) 80 MG tablet, Take 1 tablet (80 mg total) by mouth daily. Patient intolerant of the 160 mg dose, Disp: 90 tablet, Rfl: 1  Observations/Objective: Patient is well-developed, well-nourished in no acute distress.  Resting comfortably  at home.  Head is normocephalic, atraumatic.  No labored breathing.  Speech is clear and coherent with logical content.  Patient is alert and oriented at baseline.    Assessment and Plan: 1. Rash  (Primary)  Continue hydrocortisone and triamcionolone and cetirizine , stop prednisone  if glucose goes oer 250. Follow up at urgent care or with pcp if sx reoccur.   Follow Up Instructions: I discussed the assessment and treatment plan with the patient. The patient was provided an opportunity to ask questions and all were answered. The patient agreed with the plan and demonstrated an understanding of the instructions.  A copy of instructions were sent to the patient via MyChart unless otherwise noted below.     The patient was advised to call back or seek an in-person evaluation if the symptoms worsen or if the condition fails to improve as anticipated.    Andelyn Spade, FNP

## 2023-12-30 NOTE — Telephone Encounter (Signed)
 FYI Only or Action Required?: FYI only for provider: appointment scheduled on Virtual UC 12/30/23.  Patient was last seen in primary care on 10/18/2023 by Jaycee Greig PARAS, NP.  Called Nurse Triage reporting Rash.  Symptoms began several months ago.  Interventions attempted: Prescription medications: triamcinolone  cream (KENALOG ) 0.1 %.  Symptoms are: gradually worsening.  Triage Disposition: See Physician Within 24 Hours  Patient/caregiver understands and will follow disposition?: Yes Reason for Disposition  SEVERE itching (i.e., interferes with sleep, normal activities or school)  Answer Assessment - Initial Assessment Questions Been using triamcinolone  cream (KENALOG ) 0.1 % that was prescribed previously for rashes, not working. Tried Hydrocortisone. Patient requesting virtual UC, broken toe and cannot be on feet and walk around much  1. APPEARANCE of RASH: What does the rash look like? (e.g., blisters, dry flaky skin, red spots, redness, sores)     Small spots that are itchy and irritable  2. LOCATION: Where is the rash located?     Arms, legs  3. ONSET: When did the rash begin?     Couple of weeks, cleared up and came back  4. FEVER: Do you have a fever? If Yes, ask: What is your temperature, how was it measured, and when did it start?     Denies  5. ITCHING: Does the rash itch? If Yes, ask: How bad is the itch? (Scale 1-10; or mild, moderate, severe)     Very itchy  6. CAUSE: What do you think is causing the rash?     Unsure  7. OTHER SYMPTOMS: Do you have any other symptoms? (e.g., dizziness, headache, sore throat, joint pain)       Denies  Protocols used: Rash or Redness - Widespread-A-AH  Copied from CRM [id number]. Topic: Clinical - Red Word Triage >> Dec 30, 2023  1:15 PM Charlet HERO wrote: Red Word that prompted transfer to Nurse Triage: Patient is calling bc she has a rash that is spreading all over her body. Dr. Vicci

## 2023-12-31 NOTE — Telephone Encounter (Signed)
 I note pt had video visit.

## 2024-01-02 ENCOUNTER — Other Ambulatory Visit: Payer: Self-pay

## 2024-01-18 ENCOUNTER — Ambulatory Visit (INDEPENDENT_AMBULATORY_CARE_PROVIDER_SITE_OTHER)

## 2024-01-18 ENCOUNTER — Ambulatory Visit (INDEPENDENT_AMBULATORY_CARE_PROVIDER_SITE_OTHER): Admitting: Podiatry

## 2024-01-18 ENCOUNTER — Encounter: Payer: Self-pay | Admitting: Podiatry

## 2024-01-18 DIAGNOSIS — S92354G Nondisplaced fracture of fifth metatarsal bone, right foot, subsequent encounter for fracture with delayed healing: Secondary | ICD-10-CM | POA: Diagnosis not present

## 2024-01-18 DIAGNOSIS — S9031XA Contusion of right foot, initial encounter: Secondary | ICD-10-CM

## 2024-01-18 NOTE — Progress Notes (Signed)
 Subjective:   Patient ID: Laurie Andrews, female   DOB: 53 y.o.   MRN: [id number]   HPI Patient presents stating still getting some swelling but it seems to be some improved but I feel like I do not have great movement   ROS      Objective:  Physical Exam  Neurovascular status intact with mild edema still noted around the right lateral foot but improved from where it was with patient still wearing boot     Assessment:  Possibility for a delayed healing of fracture fifth metatarsal right but appears to be making gradual improvement     Plan:  H&P x-ray reviewed recommended continued ice and anti-inflammatories as needed and gradual return to shoes over the next few weeks  X-rays indicate that there is no signs of nonunion currently there may be moderate delayed union but I do see signs of healing and I am hopeful over the next 8 weeks this will heal.  If it does not it will be a strong consideration for delayed union may require a bone stimulator  X-rays indicate that there is healing around the fifth metatarsal head that is occurring with hopeful secondary union.  Reappoint 6 to 8 weeks

## 2024-01-23 ENCOUNTER — Other Ambulatory Visit: Payer: Self-pay | Admitting: Internal Medicine

## 2024-01-23 DIAGNOSIS — I152 Hypertension secondary to endocrine disorders: Secondary | ICD-10-CM

## 2024-01-30 ENCOUNTER — Other Ambulatory Visit: Payer: Self-pay

## 2024-01-30 ENCOUNTER — Other Ambulatory Visit (HOSPITAL_COMMUNITY): Payer: Self-pay

## 2024-01-30 ENCOUNTER — Telehealth: Payer: Self-pay | Admitting: Internal Medicine

## 2024-01-30 NOTE — Telephone Encounter (Signed)
 Copied from CRM #[id number]. Topic: Clinical - Medical Advice >> Jan 30, 2024  9:48 AM Nessti S wrote:  Reason for CRM: called because pt wants to have home health assistance at home.

## 2024-01-31 ENCOUNTER — Other Ambulatory Visit: Payer: Self-pay

## 2024-01-31 NOTE — Telephone Encounter (Signed)
 I called the patient and informed her that we received a message that she is interested in home health services but I do not have the name of the person who called.  She said she spoke to a representative from [facility] about this.  I explained to her the difference between home health care ( SN, PT,OT,ST) and PCS and she said she needs PCS.  She stated that she is at risk of falling. I explained to her that she will need an appointment with Dr Vicci prior to submitting that referral because it has  been more than 90 days since she was seen.  She has an upcoming appointment with Dr Vicci on 02/16/2024 and I told her that she can discuss PCS as well as home health services with Dr Vicci at that time and she said she understood

## 2024-02-01 ENCOUNTER — Other Ambulatory Visit (HOSPITAL_COMMUNITY): Payer: Self-pay

## 2024-02-06 ENCOUNTER — Ambulatory Visit: Admitting: "Endocrinology

## 2024-02-07 ENCOUNTER — Other Ambulatory Visit: Payer: Self-pay | Admitting: Internal Medicine

## 2024-02-07 DIAGNOSIS — J4540 Moderate persistent asthma, uncomplicated: Secondary | ICD-10-CM

## 2024-02-07 NOTE — Telephone Encounter (Unsigned)
 Copied from CRM #[id number]. Topic: Clinical - Medication Refill >> Feb 07, 2024  2:19 PM Teressa P wrote: Medication: alcohol  pads and Albuterol  inhaler 108  Has the patient contacted their pharmacy? Yes (Agent: If no, request that the patient contact the pharmacy for the refill. If patient does not wish to contact the pharmacy document the reason why and proceed with request.) (Agent: If yes, when and what did the pharmacy advise?)  This is the patient's preferred pharmacy:  [facility] [address], [address] [place] [address] Phone: [phone] Fax: [phone]   Is this the correct pharmacy for this prescription? Yes If no, delete pharmacy and type the correct one.   Has the prescription been filled recently? Yes  Is the patient out of the medication? Yes  Has the patient been seen for an appointment in the last year OR does the patient have an upcoming appointment? Yes  Can we respond through MyChart? No  Agent: Please be advised that Rx refills may take up to 3 business days. We ask that you follow-up with your pharmacy.

## 2024-02-09 MED ORDER — ALBUTEROL SULFATE HFA 108 (90 BASE) MCG/ACT IN AERS
2.0000 | INHALATION_SPRAY | Freq: Four times a day (QID) | RESPIRATORY_TRACT | 1 refills | Status: AC | PRN
  Filled 2024-02-09: qty 54, fill #0

## 2024-02-09 MED ORDER — ALCOHOL PREP 70 % PADS
MEDICATED_PAD | 1 refills | Status: AC
  Filled 2024-02-09: qty 400, 100d supply, fill #0

## 2024-02-09 NOTE — Telephone Encounter (Signed)
 Requested Prescriptions  Pending Prescriptions Disp Refills   Alcohol  Swabs (ALCOHOL  PREP) 70 % PADS 400 each 1    Sig: Use with 4 insulin  injections daily.     Off-Protocol Failed - 02/09/2024  9:23 AM      Failed - Medication not assigned to a protocol, review manually.      Passed - Valid encounter within last 12 months    Recent Outpatient Visits           1 month ago Long term (current) use of insulin  (HCC)   [place] [facility] Dept Of [place]. [facility] Fleeta Morris, [place] L, RPH-CPP   3 months ago Type 2 diabetes mellitus with hyperglycemia, with long-term current use of insulin  [facility])   [place] [facility] - A Dept Of [place]. [facility] Fleeta Morris Garnette LITTIE, RPH-CPP   3 months ago Fall, subsequent encounter   [facility] at [facility], Amy J, NP   5 months ago Hypertension associated with diabetes [facility])   [place] [facility] - A Dept Of [place]. [facility] Vicci Barnie NOVAK, MD   5 months ago Long term (current) use of oral hypoglycemic drugs   [place] [facility] - A Dept Of [place]. [facility] Fleeta Morris, Garnette LITTIE, RPH-CPP       Future Appointments             In 2 weeks Motwani, Komal, MD [facility] Endocrinology             albuterol  (VENTOLIN  HFA) 108 (90 Base) MCG/ACT inhaler 54 g 1    Sig: Inhale 2 puffs into the lungs every 6 (six) hours as needed for wheezing or shortness of breath.     Pulmonology:  Beta Agonists 2 Passed - 02/09/2024  9:23 AM      Passed - Last BP in normal range    BP Readings from Last 1 Encounters:  10/18/23 138/82         Passed - Last Heart Rate in normal range    Pulse Readings from Last 1 Encounters:  10/18/23 97         Passed - Valid encounter within last 12 months    Recent Outpatient Visits           1 month ago Long term (current) use of insulin  (HCC)   [place] [place] [facility]  [place] - A Dept Of [place]. [facility] Fleeta Morris, [place] L, RPH-CPP   3 months ago Type 2 diabetes mellitus with hyperglycemia, with long-term current use of insulin  [facility])   [place] [facility] - A Dept Of [place]. [facility] Fleeta Morris Garnette LITTIE, RPH-CPP   3 months ago Fall, subsequent encounter   [facility] Primary Care at [facility], Amy J, NP   5 months ago Hypertension associated with diabetes [facility])   [place] [facility] - A Dept Of [place]. [facility] Vicci Barnie NOVAK, MD   5 months ago Long term (current) use of oral hypoglycemic drugs   [place] [facility] - A Dept Of [place]. [facility] Fleeta Morris, Garnette LITTIE, RPH-CPP       Future Appointments             In 2 weeks Dartha Ernst, MD [facility] Endocrinology

## 2024-02-09 NOTE — Telephone Encounter (Signed)
 Requested medication (s) are due for refill today: Yes  Requested medication (s) are on the active medication list: Yes  Last refill:  09/05/23  Future visit scheduled: Yes  Notes to clinic:  Unable to refill due to no refill protocol for this medication.      Requested Prescriptions  Pending Prescriptions Disp Refills   Alcohol  Swabs (ALCOHOL  PREP) 70 % PADS 400 each 1    Sig: Use with 4 insulin  injections daily.     Off-Protocol Failed - 02/09/2024  9:23 AM      Failed - Medication not assigned to a protocol, review manually.      Passed - Valid encounter within last 12 months    Recent Outpatient Visits           1 month ago Long term (current) use of insulin  (HCC)   [place] [facility] A Dept Of [place]. [facility] Fleeta Morris, [place] L, RPH-CPP   3 months ago Type 2 diabetes mellitus with hyperglycemia, with long-term current use of insulin  [facility])   [place] [facility] - A Dept Of [place]. [facility] Fleeta Morris Garnette LITTIE, RPH-CPP   3 months ago Fall, subsequent encounter   [facility] at [facility], Amy J, NP   5 months ago Hypertension associated with diabetes [facility])   [place] [facility] - A Dept Of [place]. [facility] Vicci Barnie NOVAK, MD   5 months ago Long term (current) use of oral hypoglycemic drugs   [place] [facility] - A Dept Of [place]. [facility] Fleeta Morris, Garnette LITTIE, RPH-CPP       Future Appointments             In 2 weeks Motwani, Komal, MD [facility] Endocrinology            Signed Prescriptions Disp Refills   albuterol  (VENTOLIN  HFA) 108 (90 Base) MCG/ACT inhaler 54 g 1    Sig: Inhale 2 puffs into the lungs every 6 (six) hours as needed for wheezing or shortness of breath.     Pulmonology:  Beta Agonists 2 Passed - 02/09/2024  9:23 AM      Passed - Last BP in normal range    BP Readings from Last 1  Encounters:  10/18/23 138/82         Passed - Last Heart Rate in normal range    Pulse Readings from Last 1 Encounters:  10/18/23 97         Passed - Valid encounter within last 12 months    Recent Outpatient Visits           1 month ago Long term (current) use of insulin  (HCC)   [place] [facility] [place] [facility] Dept Of [place]. [facility] Fleeta Morris, [place] L, RPH-CPP   3 months ago Type 2 diabetes mellitus with hyperglycemia, with long-term current use of insulin  [facility])   [place] [facility] - A Dept Of [place]. [facility] Fleeta Morris Garnette LITTIE, RPH-CPP   3 months ago Fall, subsequent encounter   [facility] Care at [facility], Amy J, NP   5 months ago Hypertension associated with diabetes [facility])   [place] [facility] - A Dept Of [place]. [facility] Vicci Barnie NOVAK, MD   5 months ago Long term (current) use  of oral hypoglycemic drugs    [facility] - A Dept Of [place]. [facility] Fleeta Morris, Garnette CROME, RPH-CPP       Future Appointments             In 2 weeks Dartha Ernst, MD [facility] Endocrinology

## 2024-02-10 ENCOUNTER — Other Ambulatory Visit: Payer: Self-pay

## 2024-02-15 ENCOUNTER — Ambulatory Visit: Payer: Self-pay

## 2024-02-15 DIAGNOSIS — G8929 Other chronic pain: Secondary | ICD-10-CM

## 2024-02-15 NOTE — Telephone Encounter (Signed)
 FYI Only or Action Required?: Action required by provider: referral request.  Patient was last seen in primary care on 12/30/2023 by Blair, Diane W, FNP.  Called Nurse Triage reporting Shoulder Pain.  Symptoms began several months ago.  Interventions attempted: Prescription medications: Oxycodone .  Symptoms are: unchanged.  Triage Disposition: Home Care (overriding See HCP Within 4 Hours (Or PCP Triage))  Patient/caregiver understands and will follow disposition?: Yes  Reason for Disposition  [1] SEVERE pain (e.g., excruciating) AND [2] not improved 2 hours after pain medicine/ice packs  Answer Assessment - Initial Assessment Questions Patient states that she fell in September and injured her shoulder. She states that she has spoken with PCP regarding this injury and pain. She was supposed to start physical therapy, but she states that they need a new referral because they were unable to reach her to set up appointment. She denies any new or worsening symptoms. Has an appt tomorrow 1/8 that she requested to reschedule for personal reasons. Office visit rescheduled.   1. MECHANISM: How did the injury happen?     Fall  2. ONSET: When did the injury happen? (e.g., minutes, hours ago)      September 2025  3. APPEARANCE of INJURY: What does the injury look like?      NA  4. SEVERITY: Can you move the shoulder normally?      No, sometimes needs assistance with dressing  5. SIZE: For cuts, bruises, or swelling, ask: How large is it? (e.g., inches or centimeters;  entire joint)      NA  6. PAIN: Is there pain? If Yes, ask: How bad is the pain?  (Scale 0-10; or none, mild, moderate, severe)     9/10  7. TETANUS: For any breaks in the skin, ask: When was your last tetanus booster?     NA  8. OTHER SYMPTOMS: Do you have any other symptoms? (e.g., loss of sensation)     Weakness  9. PREGNANCY: Is there any chance you are pregnant? When was your last menstrual  period?     NA  Protocols used: Shoulder Injury-A-AH  Copied from CRM [id number]. Topic: Clinical - Red Word Triage >> Feb 15, 2024  3:00 PM Antwanette L wrote: Red Word that prompted transfer to Nurse Triage: Pt is having right should pain . Pt needs to r/s appt

## 2024-02-16 ENCOUNTER — Ambulatory Visit: Payer: Self-pay | Admitting: Internal Medicine

## 2024-02-16 NOTE — Telephone Encounter (Signed)
 Referral submitted

## 2024-02-17 NOTE — Telephone Encounter (Signed)
 Called & spoke to the patient. Verified name & DOB. Informed of submission of PT referral per request. Patient expressed verbal understanding.

## 2024-02-20 NOTE — Progress Notes (Unsigned)
" ° ° °  S:     No chief complaint on file.  54 y.o. female who presents for diabetes evaluation, education, and management. PMH is significant for T2DM w/ peripheral neuropathy, HTN, obesity, opiate dependence, anxiety disorder, asthma, migraines, OA knees, alopecia. Patient was referred by Primary Care Provider, Dr. Vicci, on 09/09/23. Due for appointment with Endo 02/06/24 - cancelled by patient. Due for follow-up with Dr. Vicci on 02/16/24 - cancelled by patient.  Last saw pharmacy 12/22/23. At that visit, we continued her basal insulin  Toujeo  at 68 units daily due to AM hypoglycemia. Humalog  increased to 38 units TID before meals.  Today, patient presents ***. She reports *** her insulin  as directed. Of note, her episodes of hypoglycemia have ***. When pharmacy saw her in September, she was low ~9% of the week prior to her visit. Her CGM report is listed below,   Current diabetes medications include: Toujeo , Humalog  Current hypertension medications include: amlodipine  5 mg, valsartan  80 mg, hydralazine  10 mg BID Current hyperlipidemia medications include: atorvastatin  10 mg  Patient reports hypoglycemic events.  Patient *** nocturia (nighttime urination).  Patient *** neuropathy (nerve pain). Patient *** visual changes. Patient reports self foot exams.   Insurance coverage:    No relevant family hx No smoking hx    Patient reported *** meals/day Breakfast: glucose shake, sometimes skips Lunch: grilled chicken, salad, vegetables Dinner: Turkey tacos, turkey spaghetti Snacks: reports usually not snacking, sometimes oranges or grapes Beverages: mostly water, coke zero   Exercise: walks around complex (15-20 mins) 3 times a week; starting physical therapy   O:  [employer] CGM Download today for a 60 day report Date of Download: 02/21/2024  % Time CGM is active: 83% Average Glucose: 255 mg/dL Glucose Management Indicator: 9.4%  Glucose Variability: 32% (goal <36%) Time in Goal:  -  Time in range 70-180: 18% - Time above range: 81% - Time below range: 1%  Observed patterns:   Lab Results  Component Value Date   HGBA1C 10.9 (H) 10/18/2023   There were no vitals filed for this visit.  Lipid Panel     Component Value Date/Time   CHOL 128 07/28/2023 1219   TRIG 142 07/28/2023 1219   HDL 91 07/28/2023 1219   CHOLHDL 1.4 07/28/2023 1219   LDLCALC 14 07/28/2023 1219    Clinical Atherosclerotic Cardiovascular Disease (ASCVD): No  The ASCVD Risk score (Arnett DK, et al., 2019) failed to calculate for the following reasons:   The valid total cholesterol range is 130 to 320 mg/dL   A/P: Diabetes longstanding is currently uncontrolled. Most recent A1c increased to 10.9% from 9.5% back in June.  -Continued Toujeo  (insulin  glargine) *** units daily in the morning.  -INCREASE Humalog  (insulin  lispro) TO 38 units TID 15 minutes before meals.   -Patient educated on purpose, proper use, and potential adverse effects of Toujeo  and Humalog .  -Extensively discussed pathophysiology of diabetes, recommended lifestyle interventions, dietary effects on blood sugar control.  -Counseled on s/sx of and management of hypoglycemia.  -Next A1c anticipated    Written patient instructions provided. Patient verbalized understanding of treatment plan.  Total time in face to face counseling 30 minutes.    Follow-up:  Dr. Vicci 03/22/2024 Endo 02/23/2024 Pharmacy: ***  Herlene Fleeta Morris, PharmD, BCACP, CPP Clinical Pharmacist [facility] [phone]  "

## 2024-02-21 ENCOUNTER — Other Ambulatory Visit: Payer: Self-pay

## 2024-02-21 ENCOUNTER — Ambulatory Visit: Payer: Self-pay | Admitting: Pharmacist

## 2024-02-23 ENCOUNTER — Ambulatory Visit: Admitting: "Endocrinology

## 2024-03-02 ENCOUNTER — Other Ambulatory Visit: Payer: Self-pay | Admitting: Internal Medicine

## 2024-03-19 ENCOUNTER — Ambulatory Visit: Admitting: Podiatry

## 2024-03-20 ENCOUNTER — Ambulatory Visit: Admitting: Internal Medicine

## 2024-04-03 ENCOUNTER — Ambulatory Visit: Payer: 59
